# Patient Record
Sex: Female | Born: 1990
Health system: Southern US, Community
[De-identification: ages and names within clinical notes are randomized; demographics above are authoritative.]

## PROBLEM LIST (undated history)

## (undated) ENCOUNTER — Inpatient Hospital Stay (HOSPITAL_COMMUNITY): Payer: Self-pay

## (undated) DIAGNOSIS — G2579 Other drug induced movement disorders: Secondary | ICD-10-CM

## (undated) DIAGNOSIS — R87629 Unspecified abnormal cytological findings in specimens from vagina: Secondary | ICD-10-CM

## (undated) DIAGNOSIS — F419 Anxiety disorder, unspecified: Secondary | ICD-10-CM

## (undated) DIAGNOSIS — F909 Attention-deficit hyperactivity disorder, unspecified type: Secondary | ICD-10-CM

## (undated) DIAGNOSIS — J45909 Unspecified asthma, uncomplicated: Secondary | ICD-10-CM

## (undated) DIAGNOSIS — F410 Panic disorder [episodic paroxysmal anxiety] without agoraphobia: Secondary | ICD-10-CM

## (undated) DIAGNOSIS — G9081 Serotonin syndrome: Secondary | ICD-10-CM

## (undated) HISTORY — DX: Unspecified asthma, uncomplicated: J45.909

## (undated) HISTORY — PX: OTHER SURGICAL HISTORY: SHX169

## (undated) HISTORY — PX: INDUCED ABORTION: SHX677

## (undated) HISTORY — DX: Serotonin syndrome: G90.81

## (undated) HISTORY — DX: Other drug induced movement disorders: G25.79

---
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
## 2009-08-31 ENCOUNTER — Ambulatory Visit: Payer: Self-pay | Admitting: Internal Medicine

## 2011-11-09 ENCOUNTER — Emergency Department (HOSPITAL_COMMUNITY): Payer: No Typology Code available for payment source

## 2011-11-09 ENCOUNTER — Emergency Department (HOSPITAL_COMMUNITY)
Admission: EM | Admit: 2011-11-09 | Discharge: 2011-11-09 | Disposition: A | Discharge status: Home / Self Care | Payer: No Typology Code available for payment source | Attending: Emergency Medicine | Admitting: Emergency Medicine

## 2011-11-09 ENCOUNTER — Encounter (HOSPITAL_COMMUNITY): Payer: Self-pay | Admitting: Adult Health

## 2011-11-09 DIAGNOSIS — M542 Cervicalgia: Secondary | ICD-10-CM | POA: Insufficient documentation

## 2011-11-09 DIAGNOSIS — R51 Headache: Secondary | ICD-10-CM | POA: Insufficient documentation

## 2011-11-09 DIAGNOSIS — M25539 Pain in unspecified wrist: Secondary | ICD-10-CM | POA: Insufficient documentation

## 2011-11-09 DIAGNOSIS — T1490XA Injury, unspecified, initial encounter: Secondary | ICD-10-CM | POA: Insufficient documentation

## 2011-11-09 HISTORY — DX: Attention-deficit hyperactivity disorder, unspecified type: F90.9

## 2011-11-09 HISTORY — DX: Panic disorder (episodic paroxysmal anxiety): F41.0

## 2011-11-09 HISTORY — DX: Anxiety disorder, unspecified: F41.9
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
## 2011-11-09 MED ORDER — ACETAMINOPHEN 325 MG PO TABS
650.0000 mg | ORAL_TABLET | Freq: Once | ORAL | Status: AC
  Administered 2011-11-09: 650 mg via ORAL
  Filled 2011-11-09: qty 2

## 2011-11-09 MED ORDER — CYCLOBENZAPRINE HCL 5 MG PO TABS: 5.0000 mg | ORAL_TABLET | Freq: Three times a day (TID) | ORAL | Status: AC | PRN

## 2011-11-09 MED ORDER — IBUPROFEN 600 MG PO TABS: 600.0000 mg | ORAL_TABLET | Freq: Four times a day (QID) | ORAL | Status: AC | PRN
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
## 2011-11-09 NOTE — ED Notes (Signed)
Patient transported to CT 

## 2011-11-09 NOTE — ED Provider Notes (Signed)
Medical screening examination/treatment/procedure(s) were performed by non-physician practitioner and as supervising physician I was immediately available for consultation/collaboration.   Nat Christen, MD 11/09/11 [phone]
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
## 2011-11-09 NOTE — ED Notes (Signed)
PA at bedside.
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
## 2012-11-24 ENCOUNTER — Encounter: Payer: Self-pay | Admitting: Family Medicine

## 2013-09-15 NOTE — L&D Delivery Note (Signed)
SVD of VFI at 1620 on 09/07/14.  APGARs 9,9.  EBL 500cc.  Placenta to pathology. Head delivered LOA and body followed atraumatically.  Mouth and nose bulb suctioned.  Cord was clamped, cut and baby to abdomen.  Cord blood was obtained.  Placenta delivered S/I/3VC.  Fundus was firmed with pitocin and massage.  Right vaginal laceration was repaired with 3-0 Rapide in the normal fashion.  Mom and baby stable.    Mitchel HonourMegan Ermal Haberer, DO

## 2014-02-27 LAB — OB RESULTS CONSOLE GC/CHLAMYDIA
Chlamydia: NEGATIVE
Gonorrhea: NEGATIVE

## 2014-04-17 LAB — OB RESULTS CONSOLE RPR: RPR: NONREACTIVE

## 2014-04-17 LAB — OB RESULTS CONSOLE RUBELLA ANTIBODY, IGM: Rubella: IMMUNE

## 2014-04-17 LAB — OB RESULTS CONSOLE HEPATITIS B SURFACE ANTIGEN: Hepatitis B Surface Ag: NEGATIVE

## 2014-05-09 LAB — OB RESULTS CONSOLE ANTIBODY SCREEN: Antibody Screen: NEGATIVE

## 2014-05-09 LAB — OB RESULTS CONSOLE ABO/RH: RH Type: POSITIVE

## 2014-06-22 LAB — OB RESULTS CONSOLE HIV ANTIBODY (ROUTINE TESTING): HIV: NONREACTIVE

## 2014-08-09 LAB — OB RESULTS CONSOLE GBS: GBS: NEGATIVE

## 2014-08-24 ENCOUNTER — Encounter (HOSPITAL_COMMUNITY): Payer: Self-pay | Admitting: *Deleted

## 2014-08-24 ENCOUNTER — Inpatient Hospital Stay (HOSPITAL_COMMUNITY)
Admission: AD | Admit: 2014-08-24 | Discharge: 2014-08-24 | Disposition: A | Discharge status: Home / Self Care | Payer: BC Managed Care – PPO | Source: Ambulatory Visit | Attending: Obstetrics and Gynecology | Admitting: Obstetrics and Gynecology

## 2014-08-24 DIAGNOSIS — Z3A37 37 weeks gestation of pregnancy: Secondary | ICD-10-CM | POA: Insufficient documentation

## 2014-08-24 DIAGNOSIS — R51 Headache: Secondary | ICD-10-CM | POA: Diagnosis present

## 2014-08-24 DIAGNOSIS — O26893 Other specified pregnancy related conditions, third trimester: Secondary | ICD-10-CM

## 2014-08-24 DIAGNOSIS — O9989 Other specified diseases and conditions complicating pregnancy, childbirth and the puerperium: Secondary | ICD-10-CM | POA: Diagnosis not present

## 2014-08-24 DIAGNOSIS — Z87891 Personal history of nicotine dependence: Secondary | ICD-10-CM | POA: Diagnosis not present

## 2014-08-24 DIAGNOSIS — R519 Headache, unspecified: Secondary | ICD-10-CM

## 2014-08-24 HISTORY — DX: Unspecified abnormal cytological findings in specimens from vagina: R87.629

## 2014-08-24 LAB — COMPREHENSIVE METABOLIC PANEL
ALT: 13 U/L (ref 0–35)
AST: 15 U/L (ref 0–37)
Albumin: 2.7 g/dL — ABNORMAL LOW (ref 3.5–5.2)
Alkaline Phosphatase: 147 U/L — ABNORMAL HIGH (ref 39–117)
Anion gap: 13 (ref 5–15)
BUN: 7 mg/dL (ref 6–23)
CO2: 19 mEq/L (ref 19–32)
Calcium: 9 mg/dL (ref 8.4–10.5)
Chloride: 104 mEq/L (ref 96–112)
Creatinine, Ser: 0.69 mg/dL (ref 0.50–1.10)
GFR calc Af Amer: >90 mL/min (ref >90)
GFR calc non Af Amer: >90 mL/min (ref >90)
Glucose, Bld: 89 mg/dL (ref 70–99)
Potassium: 3.9 mEq/L (ref 3.7–5.3)
Sodium: 136 mEq/L — ABNORMAL LOW (ref 137–147)
Total Bilirubin: 0.2 mg/dL — ABNORMAL LOW (ref 0.3–1.2)
Total Protein: 6.3 g/dL (ref 6.0–8.3)

## 2014-08-24 LAB — CBC
HCT: 37.2 % (ref 36.0–46.0)
Hemoglobin: 12.6 g/dL (ref 12.0–15.0)
MCH: 32.2 pg (ref 26.0–34.0)
MCHC: 33.9 g/dL (ref 30.0–36.0)
MCV: 95.1 fL (ref 78.0–100.0)
Platelets: 201 10*3/uL (ref 150–400)
RBC: 3.91 MIL/uL (ref 3.87–5.11)
RDW: 13.3 % (ref 11.5–15.5)
WBC: 12.7 10*3/uL — ABNORMAL HIGH (ref 4.0–10.5)

## 2014-08-24 LAB — URINALYSIS, ROUTINE W REFLEX MICROSCOPIC
Bilirubin Urine: NEGATIVE
Glucose, UA: NEGATIVE mg/dL
Hgb urine dipstick: NEGATIVE
Ketones, ur: NEGATIVE mg/dL
Nitrite: NEGATIVE
Protein, ur: NEGATIVE mg/dL
Specific Gravity, Urine: <1.005 — ABNORMAL LOW (ref 1.005–1.030)
Urobilinogen, UA: 0.2 mg/dL (ref 0.0–1.0)
pH: 6 (ref 5.0–8.0)

## 2014-08-24 LAB — URINE MICROSCOPIC-ADD ON

## 2014-08-24 LAB — PROTEIN / CREATININE RATIO, URINE
Creatinine, Urine: 39.79 mg/dL
Protein Creatinine Ratio: 0.1 (ref 0.00–0.15)
Total Protein, Urine: 4 mg/dL

## 2014-08-24 LAB — AMNISURE RUPTURE OF MEMBRANE (ROM) NOT AT ARMC: Amnisure ROM: NEGATIVE

## 2014-08-24 MED ORDER — CYCLOBENZAPRINE HCL 10 MG PO TABS
10.0000 mg | ORAL_TABLET | Freq: Once | ORAL | Status: AC
  Administered 2014-08-24: 10 mg via ORAL
  Filled 2014-08-24: qty 1

## 2014-08-24 NOTE — MAU Provider Note (Signed)
History     CSN: [id number]  Arrival date and time: 08/24/14 1613   First Provider Initiated Contact with Patient 08/24/14 1714      Chief Complaint  Patient presents with  . Headache  . Hypertension   HPI  [name] [name] is a 23 y.o. G1P0 at 17106w5d who presents today with a headache. She states that she took two extra strength tylenol around 1400 today, and it did not help the headache. She states that she has had increased anxiety for the past couple of days. She states that she took tylenol PM last night, and woke up feeling nauseous this morning. She denies any visual disturbances. She has had some sensitivity to light today. She denies any RUQ pain.   Past Medical History  Diagnosis Date  . Anxiety   . ADHD (attention deficit hyperactivity disorder)   . Panic attacks   . Generalized headaches   . Vaginal Pap smear, abnormal     Past Surgical History  Procedure Laterality Date  . Wisdom teeth removal      History reviewed. No pertinent family history.  History  Substance Use Topics  . Smoking status: Former Smoker    Types: Cigarettes    Quit date: 08/15/2013  . Smokeless tobacco: Never Used  . Alcohol Use: Yes     Comment: socially    Allergies:  Allergies  Allergen Reactions  . Azithromycin Hives  . Ceclor [Cefaclor] Hives    Prescriptions prior to admission  Medication Sig Dispense Refill Last Dose  . acetaminophen (TYLENOL) 500 MG tablet Take 1,000 mg by mouth every 6 (six) hours as needed for headache.   08/24/2014 at Unknown time  . diphenhydramine-acetaminophen (TYLENOL PM) 25-500 MG TABS Take 2 tablets by mouth at bedtime as needed (For headache and sleep.).   08/23/2014 at Unknown time  . Prenatal Vit-Fe Fumarate-FA (PRENATAL MULTIVITAMIN) TABS tablet Take 1 tablet by mouth daily at 12 noon.   08/24/2014 at Unknown time    ROS Physical Exam   Blood pressure 134/88, pulse 86, temperature 98.9 F (37.2 C), temperature source Oral, resp. rate  18.  Physical Exam  Nursing note and vitals reviewed. Constitutional: She is oriented to person, place, and time. She appears well-developed and well-nourished. No distress.  Cardiovascular: Normal rate and regular rhythm.   Respiratory: Effort normal and breath sounds normal. No respiratory distress.  GI: Soft. There is no tenderness. There is no rebound.  Neurological: She is alert and oriented to person, place, and time. She has normal reflexes.  No clonus   Skin: Skin is warm and dry.  Psychiatric: She has a normal mood and affect.   FHT 135, moderate with 15x15 accels, no decels Toco: no UCs  MAU Course  Procedures   Results for orders placed or performed during the hospital encounter of 08/24/14 (from the past 24 hour(s))  Amnisure rupture of membrane (rom)     Status: None   Collection Time: 08/24/14  5:24 PM  Result Value Ref Range   Amnisure ROM NEGATIVE   Urinalysis, Routine w reflex microscopic     Status: Abnormal   Collection Time: 08/24/14  5:25 PM  Result Value Ref Range   Color, Urine YELLOW YELLOW   APPearance CLEAR CLEAR   Specific Gravity, Urine <1.005 (L) 1.005 - 1.030   pH 6.0 5.0 - 8.0   Glucose, UA NEGATIVE NEGATIVE mg/dL   Hgb urine dipstick NEGATIVE NEGATIVE   Bilirubin Urine NEGATIVE NEGATIVE   Ketones,  ur NEGATIVE NEGATIVE mg/dL   Protein, ur NEGATIVE NEGATIVE mg/dL   Urobilinogen, UA 0.2 0.0 - 1.0 mg/dL   Nitrite NEGATIVE NEGATIVE   Leukocytes, UA TRACE (A) NEGATIVE  Protein / creatinine ratio, urine     Status: None   Collection Time: 08/24/14  5:25 PM  Result Value Ref Range   Creatinine, Urine 39.79 mg/dL   Total Protein, Urine 4 mg/dL   Protein Creatinine Ratio 0.10 0.00 - 0.15  Urine microscopic-add on     Status: Abnormal   Collection Time: 08/24/14  5:25 PM  Result Value Ref Range   Squamous Epithelial / LPF FEW (A) RARE   WBC, UA 3-6 <3 WBC/hpf   RBC / HPF 3-6 <3 RBC/hpf   Bacteria, UA MANY (A) RARE   Urine-Other MUCOUS PRESENT    CBC     Status: Abnormal   Collection Time: 08/24/14  5:32 PM  Result Value Ref Range   WBC 12.7 (H) 4.0 - 10.5 K/uL   RBC 3.91 3.87 - 5.11 MIL/uL   Hemoglobin 12.6 12.0 - 15.0 g/dL   HCT 16.137.2 09.636.0 - 04.546.0 %   MCV 95.1 78.0 - 100.0 fL   MCH 32.2 26.0 - 34.0 pg   MCHC 33.9 30.0 - 36.0 g/dL   RDW 40.913.3 81.111.5 - 91.415.5 %   Platelets 201 150 - 400 K/uL  Comprehensive metabolic panel     Status: Abnormal   Collection Time: 08/24/14  5:32 PM  Result Value Ref Range   Sodium 136 (L) 137 - 147 mEq/L   Potassium 3.9 3.7 - 5.3 mEq/L   Chloride 104 96 - 112 mEq/L   CO2 19 19 - 32 mEq/L   Glucose, Bld 89 70 - 99 mg/dL   BUN 7 6 - 23 mg/dL   Creatinine, Ser 7.820.69 0.50 - 1.10 mg/dL   Calcium 9.0 8.4 - 95.610.5 mg/dL   Total Protein 6.3 6.0 - 8.3 g/dL   Albumin 2.7 (L) 3.5 - 5.2 g/dL   AST 15 0 - 37 U/L   ALT 13 0 - 35 U/L   Alkaline Phosphatase 147 (H) 39 - 117 U/L   Total Bilirubin 0.2 (L) 0.3 - 1.2 mg/dL   GFR calc non Af Amer >90 >90 mL/min   GFR calc Af Amer >90 >90 mL/min   Anion gap 13 5 - 15    1833: Patient reports that her headache has improved with the flexeril.  1835:DW Dr. Rana SnareLowe, ok for dc home. FU with the office as planned   Assessment and Plan   1. Headache in pregnancy, antepartum, third trimester    Pre-eclampsia danger signs reviewed Fetal kick counts Labor precautions  Return to MAU as needed  Follow-up Information    Follow up with LOWE,DAVID C, MD.   Specialty:  Obstetrics and Gynecology   Why:  As scheduled or , If symptoms worsen   Contact information:   [address], [address] [place] [id number] [phone]        [name] [name], [name] [name] 08/24/2014, 5:16 PM

## 2014-08-24 NOTE — MAU Note (Signed)
Been having horrible headache all day.  Started having blurred vision, googled it and started getting worried.  Took BP.  Called MD - instructed to come in .

## 2014-08-24 NOTE — Discharge Instructions (Signed)

## 2014-09-05 ENCOUNTER — Encounter (HOSPITAL_COMMUNITY): Payer: Self-pay | Admitting: *Deleted

## 2014-09-05 ENCOUNTER — Telehealth (HOSPITAL_COMMUNITY): Payer: Self-pay | Admitting: *Deleted

## 2014-09-05 NOTE — Telephone Encounter (Signed)
Preadmission screen  

## 2014-09-06 ENCOUNTER — Inpatient Hospital Stay (HOSPITAL_COMMUNITY)
Admission: RE | Admit: 2014-09-06 | Discharge: 2014-09-08 | DRG: 775 | Disposition: A | Discharge status: Home / Self Care | Payer: BC Managed Care – PPO | Source: Ambulatory Visit | Attending: Obstetrics & Gynecology | Admitting: Obstetrics & Gynecology

## 2014-09-06 ENCOUNTER — Encounter (HOSPITAL_COMMUNITY): Payer: Self-pay

## 2014-09-06 DIAGNOSIS — O139 Gestational [pregnancy-induced] hypertension without significant proteinuria, unspecified trimester: Secondary | ICD-10-CM | POA: Diagnosis present

## 2014-09-06 DIAGNOSIS — Z3A39 39 weeks gestation of pregnancy: Secondary | ICD-10-CM | POA: Diagnosis present

## 2014-09-06 DIAGNOSIS — O99314 Alcohol use complicating childbirth: Secondary | ICD-10-CM | POA: Diagnosis present

## 2014-09-06 DIAGNOSIS — O133 Gestational [pregnancy-induced] hypertension without significant proteinuria, third trimester: Secondary | ICD-10-CM | POA: Diagnosis present

## 2014-09-06 DIAGNOSIS — Z87891 Personal history of nicotine dependence: Secondary | ICD-10-CM

## 2014-09-06 LAB — TYPE AND SCREEN
ABO/RH(D): O POS
Antibody Screen: NEGATIVE

## 2014-09-06 LAB — CBC
HCT: 35.5 % — ABNORMAL LOW (ref 36.0–46.0)
Hemoglobin: 12.5 g/dL (ref 12.0–15.0)
MCH: 33 pg (ref 26.0–34.0)
MCHC: 35.2 g/dL (ref 30.0–36.0)
MCV: 93.7 fL (ref 78.0–100.0)
Platelets: 180 10*3/uL (ref 150–400)
RBC: 3.79 MIL/uL — ABNORMAL LOW (ref 3.87–5.11)
RDW: 13.4 % (ref 11.5–15.5)
WBC: 13.3 10*3/uL — ABNORMAL HIGH (ref 4.0–10.5)

## 2014-09-06 MED ORDER — BUTORPHANOL TARTRATE 1 MG/ML IJ SOLN
1.0000 mg | INTRAMUSCULAR | Status: DC | PRN
  Administered 2014-09-07: 1 mg via INTRAVENOUS
  Filled 2014-09-06: qty 1

## 2014-09-06 MED ORDER — FLEET ENEMA 7-19 GM/118ML RE ENEM: 1.0000 | ENEMA | Freq: Once | RECTAL | Status: DC

## 2014-09-06 MED ORDER — ZOLPIDEM TARTRATE 5 MG PO TABS
5.0000 mg | ORAL_TABLET | Freq: Every evening | ORAL | Status: DC | PRN
  Administered 2014-09-06: 5 mg via ORAL
  Filled 2014-09-06: qty 1

## 2014-09-06 MED ORDER — OXYCODONE-ACETAMINOPHEN 5-325 MG PO TABS: 2.0000 | ORAL_TABLET | ORAL | Status: DC | PRN

## 2014-09-06 MED ORDER — LACTATED RINGERS IV SOLN
500.0000 mL | INTRAVENOUS | Status: DC | PRN
Start: 2014-09-06 — End: 2014-09-07

## 2014-09-06 MED ORDER — ONDANSETRON HCL 4 MG/2ML IJ SOLN: 4.0000 mg | Freq: Four times a day (QID) | INTRAMUSCULAR | Status: DC | PRN

## 2014-09-06 MED ORDER — MISOPROSTOL 25 MCG QUARTER TABLET
25.0000 ug | ORAL_TABLET | ORAL | Status: AC | PRN
  Administered 2014-09-06 – 2014-09-07 (×2): 25 ug via VAGINAL
  Filled 2014-09-06 (×2): qty 0.25

## 2014-09-06 MED ORDER — LIDOCAINE HCL (PF) 1 % IJ SOLN
30.0000 mL | INTRAMUSCULAR | Status: AC | PRN
  Administered 2014-09-07 (×2): 4 mL via SUBCUTANEOUS

## 2014-09-06 MED ORDER — OXYTOCIN BOLUS FROM INFUSION: 500.0000 mL | INTRAVENOUS | Status: DC

## 2014-09-06 MED ORDER — BUTORPHANOL TARTRATE 1 MG/ML IJ SOLN: 1.0000 mg | INTRAMUSCULAR | Status: DC

## 2014-09-06 MED ORDER — LACTATED RINGERS IV SOLN
INTRAVENOUS | Status: DC
  Administered 2014-09-06 – 2014-09-07 (×2): via INTRAVENOUS

## 2014-09-06 MED ORDER — OXYCODONE-ACETAMINOPHEN 5-325 MG PO TABS: 1.0000 | ORAL_TABLET | ORAL | Status: DC | PRN

## 2014-09-06 MED ORDER — CITRIC ACID-SODIUM CITRATE 334-500 MG/5ML PO SOLN
30.0000 mL | ORAL | Status: DC | PRN
  Filled 2014-09-06: qty 15

## 2014-09-06 MED ORDER — ACETAMINOPHEN 325 MG PO TABS: 650.0000 mg | ORAL_TABLET | ORAL | Status: DC | PRN

## 2014-09-06 MED ORDER — TERBUTALINE SULFATE 1 MG/ML IJ SOLN: 0.2500 mg | Freq: Once | INTRAMUSCULAR | Status: AC | PRN

## 2014-09-06 MED ORDER — OXYTOCIN 40 UNITS IN LACTATED RINGERS INFUSION - SIMPLE MED
62.5000 mL/h | INTRAVENOUS | Status: DC
  Filled 2014-09-06: qty 1000

## 2014-09-06 MED ORDER — OXYTOCIN 40 UNITS IN LACTATED RINGERS INFUSION - SIMPLE MED
1.0000 m[IU]/min | INTRAVENOUS | Status: DC
  Administered 2014-09-07: 2 m[IU]/min via INTRAVENOUS
  Administered 2014-09-07: 666 m[IU]/min via INTRAVENOUS

## 2014-09-06 NOTE — Plan of Care (Signed)
Problem: Consults Goal: Birthing Suites Patient Information Press F2 to bring up selections list Outcome: Completed/Met Date Met:  09/06/14  Pt 37-[redacted] weeks EGA and Inpatient induction

## 2014-09-07 ENCOUNTER — Encounter (HOSPITAL_COMMUNITY): Payer: Self-pay

## 2014-09-07 ENCOUNTER — Inpatient Hospital Stay (HOSPITAL_COMMUNITY): Payer: BC Managed Care – PPO | Admitting: Anesthesiology

## 2014-09-07 DIAGNOSIS — O139 Gestational [pregnancy-induced] hypertension without significant proteinuria, unspecified trimester: Secondary | ICD-10-CM | POA: Diagnosis present

## 2014-09-07 LAB — CBC
HCT: 36.1 % (ref 36.0–46.0)
Hemoglobin: 12.4 g/dL (ref 12.0–15.0)
MCH: 32.3 pg (ref 26.0–34.0)
MCHC: 34.3 g/dL (ref 30.0–36.0)
MCV: 94 fL (ref 78.0–100.0)
Platelets: 173 10*3/uL (ref 150–400)
RBC: 3.84 MIL/uL — ABNORMAL LOW (ref 3.87–5.11)
RDW: 13.3 % (ref 11.5–15.5)
WBC: 12.4 10*3/uL — ABNORMAL HIGH (ref 4.0–10.5)

## 2014-09-07 LAB — RPR

## 2014-09-07 LAB — ABO/RH: ABO/RH(D): O POS

## 2014-09-07 MED ORDER — EPHEDRINE 5 MG/ML INJ
10.0000 mg | INTRAVENOUS | Status: DC | PRN
  Filled 2014-09-07: qty 2

## 2014-09-07 MED ORDER — OXYCODONE-ACETAMINOPHEN 5-325 MG PO TABS: 1.0000 | ORAL_TABLET | ORAL | Status: DC | PRN

## 2014-09-07 MED ORDER — PRENATAL MULTIVITAMIN CH
1.0000 | ORAL_TABLET | Freq: Every day | ORAL | Status: DC
  Administered 2014-09-08: 1 via ORAL
  Filled 2014-09-07: qty 1

## 2014-09-07 MED ORDER — PHENYLEPHRINE 40 MCG/ML (10ML) SYRINGE FOR IV PUSH (FOR BLOOD PRESSURE SUPPORT)
80.0000 ug | PREFILLED_SYRINGE | INTRAVENOUS | Status: DC | PRN
Start: 2014-09-07 — End: 2014-09-07

## 2014-09-07 MED ORDER — ZOLPIDEM TARTRATE 5 MG PO TABS: 5.0000 mg | ORAL_TABLET | Freq: Every evening | ORAL | Status: DC | PRN

## 2014-09-07 MED ORDER — TETANUS-DIPHTH-ACELL PERTUSSIS 5-2.5-18.5 LF-MCG/0.5 IM SUSP: 0.5000 mL | Freq: Once | INTRAMUSCULAR | Status: DC

## 2014-09-07 MED ORDER — DIPHENHYDRAMINE HCL 50 MG/ML IJ SOLN: 12.5000 mg | INTRAMUSCULAR | Status: DC | PRN

## 2014-09-07 MED ORDER — LACTATED RINGERS IV SOLN: 500.0000 mL | Freq: Once | INTRAVENOUS | Status: DC

## 2014-09-07 MED ORDER — EPHEDRINE 5 MG/ML INJ
10.0000 mg | INTRAVENOUS | Status: DC | PRN
Start: 2014-09-07 — End: 2014-09-07

## 2014-09-07 MED ORDER — LANOLIN HYDROUS EX OINT: TOPICAL_OINTMENT | CUTANEOUS | Status: DC | PRN

## 2014-09-07 MED ORDER — FENTANYL 2.5 MCG/ML BUPIVACAINE 1/10 % EPIDURAL INFUSION (WH - ANES)
14.0000 mL/h | INTRAMUSCULAR | Status: DC | PRN
Start: 2014-09-07 — End: 2014-09-08
  Administered 2014-09-07 (×2): 14 mL/h via EPIDURAL
  Filled 2014-09-07 (×2): qty 125

## 2014-09-07 MED ORDER — EPHEDRINE 5 MG/ML INJ: 10.0000 mg | INTRAVENOUS | Status: DC | PRN

## 2014-09-07 MED ORDER — ONDANSETRON HCL 4 MG/2ML IJ SOLN: 4.0000 mg | INTRAMUSCULAR | Status: DC | PRN

## 2014-09-07 MED ORDER — IBUPROFEN 600 MG PO TABS
600.0000 mg | ORAL_TABLET | Freq: Four times a day (QID) | ORAL | Status: DC
  Administered 2014-09-07 – 2014-09-08 (×4): 600 mg via ORAL
  Filled 2014-09-07 (×4): qty 1

## 2014-09-07 MED ORDER — BENZOCAINE-MENTHOL 20-0.5 % EX AERO
1.0000 "application " | INHALATION_SPRAY | CUTANEOUS | Status: DC | PRN
  Administered 2014-09-07: 1 via TOPICAL
  Filled 2014-09-07: qty 56

## 2014-09-07 MED ORDER — DIBUCAINE 1 % RE OINT: 1.0000 "application " | TOPICAL_OINTMENT | RECTAL | Status: DC | PRN

## 2014-09-07 MED ORDER — WITCH HAZEL-GLYCERIN EX PADS
1.0000 "application " | MEDICATED_PAD | CUTANEOUS | Status: DC | PRN
  Administered 2014-09-07: 1 via TOPICAL

## 2014-09-07 MED ORDER — LIDOCAINE HCL (PF) 1 % IJ SOLN: 30.0000 mL | Freq: Once | INTRAMUSCULAR | Status: DC

## 2014-09-07 MED ORDER — OXYCODONE-ACETAMINOPHEN 5-325 MG PO TABS
2.0000 | ORAL_TABLET | ORAL | Status: DC | PRN
  Administered 2014-09-07 – 2014-09-08 (×5): 2 via ORAL
  Filled 2014-09-07 (×5): qty 2

## 2014-09-07 MED ORDER — FENTANYL 2.5 MCG/ML BUPIVACAINE 1/10 % EPIDURAL INFUSION (WH - ANES): 14.0000 mL/h | INTRAMUSCULAR | Status: DC | PRN

## 2014-09-07 MED ORDER — DIPHENHYDRAMINE HCL 25 MG PO CAPS: 25.0000 mg | ORAL_CAPSULE | Freq: Four times a day (QID) | ORAL | Status: DC | PRN

## 2014-09-07 MED ORDER — SIMETHICONE 80 MG PO CHEW: 80.0000 mg | CHEWABLE_TABLET | ORAL | Status: DC | PRN

## 2014-09-07 MED ORDER — PHENYLEPHRINE 40 MCG/ML (10ML) SYRINGE FOR IV PUSH (FOR BLOOD PRESSURE SUPPORT)
80.0000 ug | PREFILLED_SYRINGE | INTRAVENOUS | Status: DC | PRN
  Filled 2014-09-07: qty 2

## 2014-09-07 MED ORDER — ONDANSETRON HCL 4 MG PO TABS: 4.0000 mg | ORAL_TABLET | ORAL | Status: DC | PRN

## 2014-09-07 MED ORDER — PHENYLEPHRINE 40 MCG/ML (10ML) SYRINGE FOR IV PUSH (FOR BLOOD PRESSURE SUPPORT)
80.0000 ug | PREFILLED_SYRINGE | INTRAVENOUS | Status: DC | PRN
  Filled 2014-09-07: qty 2
  Filled 2014-09-07: qty 10

## 2014-09-07 MED ORDER — SENNOSIDES-DOCUSATE SODIUM 8.6-50 MG PO TABS
2.0000 | ORAL_TABLET | ORAL | Status: DC
  Administered 2014-09-08 (×2): 1 via ORAL
  Filled 2014-09-07: qty 1
  Filled 2014-09-07: qty 2

## 2014-09-07 NOTE — Anesthesia Preprocedure Evaluation (Signed)
Anesthesia Evaluation  Patient identified by MRN, date of birth, ID band Patient awake    Reviewed: Allergy & Precautions, H&P , NPO status , Patient's Chart, lab work & pertinent test results  History of Anesthesia Complications Negative for: history of anesthetic complications  Airway Mallampati: II       Dental  (+) Teeth Intact, Dental Advidsory Given   Pulmonary former smoker,  breath sounds clear to auscultation        Cardiovascular Exercise Tolerance: Good negative cardio ROS  Rhythm:regular Rate:Normal     Neuro/Psych    GI/Hepatic negative GI ROS, Neg liver ROS,   Endo/Other  obesity  Renal/GU negative Renal ROS     Musculoskeletal   Abdominal   Peds  Hematology negative hematology ROS (+)   Anesthesia Other Findings   Reproductive/Obstetrics (+) Pregnancy                             Anesthesia Physical Anesthesia Plan  ASA: II  Anesthesia Plan: Epidural   Post-op Pain Management:    Induction:   Airway Management Planned:   Additional Equipment:   Intra-op Plan:   Post-operative Plan:   Informed Consent: I have reviewed the patients History and Physical, chart, labs and discussed the procedure including the risks, benefits and alternatives for the proposed anesthesia with the patient or authorized representative who has indicated his/her understanding and acceptance.   Dental Advisory Given  Plan Discussed with: Anesthesiologist  Anesthesia Plan Comments:         Anesthesia Quick Evaluation

## 2014-09-07 NOTE — Anesthesia Procedure Notes (Signed)
Epidural Patient location during procedure: OB Start time: 09/07/2014 7:50 AM End time: 09/07/2014 8:00 AM  Staffing Anesthesiologist: Karie SchwalbeJUDD, Eileene Kisling JENNETTE Performed by: anesthesiologist   Preanesthetic Checklist Completed: patient identified, site marked, surgical consent, pre-op evaluation, timeout performed, IV checked, risks and benefits discussed and monitors and equipment checked  Epidural Patient position: sitting Prep: site prepped and draped and DuraPrep Patient monitoring: continuous pulse ox and blood pressure Approach: midline Location: L3-L4 Injection technique: LOR saline  Needle:  Needle type: Tuohy  Needle gauge: 17 G Needle length: 9 cm and 9 Needle insertion depth: 7 cm Catheter type: closed end flexible Catheter size: 19 Gauge Catheter at skin depth: 11 cm Test dose: negative  Assessment Sensory level: T10 Events: blood not aspirated, injection not painful, no injection resistance, negative IV test and no paresthesia  Additional Notes Reason for block:procedure for pain

## 2014-09-07 NOTE — H&P (Signed)
[name] [name] is a 23 y.o. female presenting for induction of labor for gestational hypertension.  She has no HA, CP/SOB, RUQ pain, or vision change.  She received VMP overnight and is currently on pitocin; comfortable with epidural.  Antepartum course has been complicated by anxiety on no medication.  The fetus also has a single umbilical artery with otherwise normal anatomy scan and normal growth.  GBS negative.  Maternal Medical History:  Reason for admission: Nausea.  Fetal activity: Perceived fetal activity is normal.   Last perceived fetal movement was within the past hour.    Prenatal complications: no prenatal complications Prenatal Complications - Diabetes: none.    OB History    Gravida Para Term Preterm AB TAB SAB Ectopic Multiple Living   2 0   1 1         Past Medical History  Diagnosis Date  . Anxiety   . ADHD (attention deficit hyperactivity disorder)   . Panic attacks   . Generalized headaches   . Vaginal Pap smear, abnormal    Past Surgical History  Procedure Laterality Date  . Wisdom teeth removal    . Induced abortion     Family History: family history includes Cancer in her father; Heart disease in her father and paternal grandfather; Hypertension in her mother. Social History:  reports that she quit smoking about 12 months ago. Her smoking use included Cigarettes. She smoked 0.00 packs per day. She has never used smokeless tobacco. She reports that she drinks alcohol. She reports that she does not use illicit drugs.   Prenatal Transfer Tool  Maternal Diabetes: No Genetic Screening: Declined Maternal Ultrasounds/Referrals: Abnormal:  Findings:   Other:single umbilical artery Fetal Ultrasounds or other Referrals:  None Maternal Substance Abuse:  No Significant Maternal Medications:  None Significant Maternal Lab Results:  Lab values include: Group B Strep negative Other Comments:  None  Review of Systems  Eyes: Negative for blurred vision.   Cardiovascular: Negative for chest pain.  Gastrointestinal: Negative for nausea and vomiting.  Neurological: Negative for headaches.    Dilation: 1.5 Effacement (%): 80 Station: 0 Exam by:: [name] NickelAleta Harper, [name] [name] Blood pressure 132/97, pulse 86, temperature 98.5 F (36.9 C), temperature source Oral, resp. rate 18, height 5\' 7"  (1.702 m), weight 212 lb (96.163 kg), SpO2 99 %. Maternal Exam:  Uterine Assessment: Contraction strength is moderate.  Contraction frequency is regular.   Abdomen: Patient reports no abdominal tenderness. Fundal height is c/w dates.   Estimated fetal weight is 7#8.       Physical Exam  Constitutional: She is oriented to person, place, and time. She appears well-developed and well-nourished.  GI: Soft. There is no rebound and no guarding.  Neurological: She is alert and oriented to person, place, and time.  Skin: Skin is warm and dry.  Psychiatric: She has a normal mood and affect. Her behavior is normal.    Prenatal labs: ABO, Rh: --/--/O POS, O POS (12/23 2100) Antibody: NEG (12/23 2100) Rubella: Immune (08/03 0000) RPR: NON REAC (12/23 2100)  HBsAg: Negative (08/03 0000)  HIV: Non-reactive (10/08 0000)  GBS: Negative (11/25 0000)   Assessment/Plan: 23yo G2P0010 at 5233w5d with GHTN for IOL -Continue pitocin -Will AROM when able -Anticipate NSVD   [name] [name] 09/07/2014, 9:01 AM

## 2014-09-08 LAB — CBC
HCT: 31.3 % — ABNORMAL LOW (ref 36.0–46.0)
Hemoglobin: 10.7 g/dL — ABNORMAL LOW (ref 12.0–15.0)
MCH: 32.3 pg (ref 26.0–34.0)
MCHC: 34.2 g/dL (ref 30.0–36.0)
MCV: 94.6 fL (ref 78.0–100.0)
Platelets: 156 10*3/uL (ref 150–400)
RBC: 3.31 MIL/uL — ABNORMAL LOW (ref 3.87–5.11)
RDW: 13.4 % (ref 11.5–15.5)
WBC: 14.2 10*3/uL — ABNORMAL HIGH (ref 4.0–10.5)

## 2014-09-08 MED ORDER — OXYCODONE-ACETAMINOPHEN 5-325 MG PO TABS: 1.0000 | ORAL_TABLET | ORAL | Status: DC | PRN

## 2014-09-08 MED ORDER — IBUPROFEN 600 MG PO TABS: 600.0000 mg | ORAL_TABLET | Freq: Four times a day (QID) | ORAL | Status: DC

## 2014-09-08 NOTE — Lactation Note (Signed)
This note was copied from the chart of Girl Verdia Tuminello. Lactation Consultation Note  Patient Name: Girl Rosemarie BeathSophia Grieco VZDGL'OToday's Date: 09/08/2014 Reason for consult: Initial assessment  Baby 22 hours of life. Mom reports sore nipples and difficulty latching baby. Mom not able to hand express colostrum readily. After about 5 minutes of massage and hand expression, able to get a drop of colostrum from right breast. Tip of baby's tongue is heart-shaped, and baby has trouble suckling LC's gloved finger. Baby tongue-thrusts, and mom reports this is what it feels like the baby does at the breast. Enc parents to discuss baby's tight frenulum with pediatrician, especially if difficulty latching and nipple pain continue after milk comes in. [facility] with [facility]atching baby directly to breast without NS. Offered to assist mom to supplement baby at breast using curve-tipped syringe and NS, but mom preferred FOB give formula with bottle. Demonstrated to FOB how to supplement with bottle according to supplementation guidelines.  Plan if for mom to put baby to breast with cues, then supplement with EBM/formula according to guidelines, then post-pump for 15 minutes. Mom states that she has a DEBP at home. Enc mom to hand express after pumping. Mom aware of OP/BFSG and LC phone line assistance after D/C. Referred parents to Baby and Me booklet for number of diapers to expect and EBM guidelines. Enc parents to call for assistance as needed. Parents state that baby has a follow-up appointment with pediatrician on Sunday, December 27th. Maternal Data Has patient been taught Hand Expression?: Yes Does the patient have breastfeeding experience prior to this delivery?: No  Feeding Feeding Type: Breast Fed Length of feed: 0 min  LATCH Score/Interventions Latch: Too sleepy or reluctant, no latch achieved, no sucking elicited.                    Lactation Tools  Discussed/Used     Consult Status Consult Status: PRN    Geralynn OchsWILLIARD, Callin Ashe 09/08/2014, 3:13 PM

## 2014-09-08 NOTE — Discharge Summary (Signed)
Obstetric Discharge Summary Reason for Admission: onset of labor Prenatal Procedures: Intrapartum Procedures: spontaneous vaginal delivery Postpartum Procedures: none Complications-Operative and Postpartum: 1st degree perineal laceration HEMOGLOBIN  Date Value Ref Range Status  09/08/2014 10.7* 12.0 - 15.0 g/dL Final   HCT  Date Value Ref Range Status  09/08/2014 31.3* 36.0 - 46.0 % Final    Physical Exam:  General: alert, cooperative, appears stated age and mild distress Lochia: appropriate Uterine Fundus: firm Incision: healing well DVT Evaluation: No evidence of DVT seen on physical exam.  Discharge Diagnoses: Term Pregnancy-delivered  Discharge Information: Date: 09/08/2014 Activity: pelvic rest Diet: routine Medications: Ibuprofen and Percocet Condition: stable Instructions: refer to practice specific booklet Discharge to: home   Newborn Data: Live born female  Birth Weight: 7 lb 13.8 oz (3565 g) APGAR: 9, 9  Home with mother.  [name] [name] 09/08/2014, 8:12 AM

## 2014-09-08 NOTE — Anesthesia Postprocedure Evaluation (Signed)
  Anesthesia Post-op Note  Anesthesia Post Note  Patient: [name] CoyerSophia M [name]  Procedure(s) Performed: * No procedures listed *  Anesthesia type: Epidural  Patient location: Mother/Baby  Post pain: Pain level controlled  Post assessment: Post-op Vital signs reviewed  Last Vitals:  Filed Vitals:   09/08/14 0618  BP: 126/69  Pulse: 87  Temp: 36.6 C  Resp:     Post vital signs: Reviewed  Level of consciousness:alert  Complications: No apparent anesthesia complications

## 2014-09-10 ENCOUNTER — Inpatient Hospital Stay (HOSPITAL_COMMUNITY): Admission: RE | Admit: 2014-09-10 | Payer: No Typology Code available for payment source | Source: Ambulatory Visit

## 2014-10-23 ENCOUNTER — Telehealth: Payer: Self-pay | Admitting: *Deleted

## 2014-10-23 ENCOUNTER — Ambulatory Visit (INDEPENDENT_AMBULATORY_CARE_PROVIDER_SITE_OTHER): Payer: BLUE CROSS/BLUE SHIELD | Admitting: Family Medicine

## 2014-10-23 ENCOUNTER — Encounter: Payer: Self-pay | Admitting: Family Medicine

## 2014-10-23 VITALS — BP 110/80 | HR 90 | Temp 98.4°F | Resp 16 | Ht 67.0 in | Wt 187.0 lb

## 2014-10-23 DIAGNOSIS — L03019 Cellulitis of unspecified finger: Secondary | ICD-10-CM

## 2014-10-23 DIAGNOSIS — F988 Other specified behavioral and emotional disorders with onset usually occurring in childhood and adolescence: Secondary | ICD-10-CM

## 2014-10-23 DIAGNOSIS — IMO0002 Reserved for concepts with insufficient information to code with codable children: Secondary | ICD-10-CM

## 2014-10-23 DIAGNOSIS — F909 Attention-deficit hyperactivity disorder, unspecified type: Secondary | ICD-10-CM

## 2014-10-23 DIAGNOSIS — F419 Anxiety disorder, unspecified: Secondary | ICD-10-CM

## 2014-10-23 MED ORDER — AMPHETAMINE-DEXTROAMPHETAMINE 20 MG PO TABS: ORAL_TABLET | ORAL | Status: DC

## 2014-10-23 MED ORDER — SULFAMETHOXAZOLE-TRIMETHOPRIM 800-160 MG PO TABS: 1.0000 | ORAL_TABLET | Freq: Two times a day (BID) | ORAL | Status: DC

## 2014-10-23 MED ORDER — CLONAZEPAM 1 MG PO TABS: ORAL_TABLET | ORAL | Status: DC

## 2014-10-23 NOTE — Progress Notes (Signed)
[facility] [address] [place] [address] [phone]  Date:  10/23/2014   Name:  [name] [name]   DOB:  Oct 03, 1990   MRN:  [id number]  PCP:  No primary care provider on file.    Chief Complaint: Establish Care   History of Present Illness:  [name] [name] is a 24 y.o. very pleasant female patient who presents with the following:  She is here today as a new patient. She moved back to [place] from [place] during her recent pregnancy.  She delivered a healhty baby girl on December. She has family in the area so she and her husband came back   She would like for [place] to take over writing for her medications as below.   She is not breastfeeding due to her medications.   She is on adderall 20 BID.  She was dx with ADD in the 4th grade.  She went to college at [employer], was treated for add there and most recently she was being treated in [place] by her PCP.  She is also on klonpin for anxiety prn. She takes 1 mg- 1/2 or 1 once a day generally.  She is on OCP but has not yet had a cycle since her delivery- this is her first pack of pills  She is otherwise generally in good health  Also about 3 weeks ago she notes that around her nails seemed to be tender, red and hot, and she could actually express some pus. This has improved but not yet resolved- no more pus but still tender   Patient Active Problem List   Diagnosis Date Noted  . Gestational hypertension 09/07/2014  . Normal labor 09/06/2014    Past Medical History  Diagnosis Date  . Anxiety   . ADHD (attention deficit hyperactivity disorder)   . Panic attacks   . Generalized headaches   . Vaginal Pap smear, abnormal     Past Surgical History  Procedure Laterality Date  . Wisdom teeth removal Bilateral     2010  . Induced abortion      History  Substance Use Topics  . Smoking status: Former Smoker    Types: Cigarettes    Quit date: 08/15/2013  . Smokeless tobacco: Never Used  . Alcohol Use: 1.2 oz/week    2  Not specified per week     Comment: socially    Family History  Problem Relation Age of Onset  . Hypertension Mother   . Heart disease Father   . Cancer Father     testicular  . Hypertension Father   . Hyperlipidemia Father   . Heart disease Paternal Grandfather     Allergies  Allergen Reactions  . Azithromycin Hives  . Ceclor [Cefaclor] Hives    Medication list has been reviewed and updated.  Current Outpatient Prescriptions on File Prior to Visit  Medication Sig Dispense Refill  . acetaminophen (TYLENOL) 500 MG tablet Take 1,000 mg by mouth every 6 (six) hours as needed for headache.    . diphenhydramine-acetaminophen (TYLENOL PM) 25-500 MG TABS Take 2 tablets by mouth at bedtime as needed (For headache and sleep.).    Marland Kitchen ibuprofen (ADVIL,MOTRIN) 600 MG tablet Take 1 tablet (600 mg total) by mouth every 6 (six) hours. (Patient not taking: Reported on 10/23/2014) 30 tablet 0  . oxyCODONE-acetaminophen (PERCOCET/ROXICET) 5-325 MG per tablet Take 1 tablet by mouth every 4 (four) hours as needed (for pain scale less than 7). (Patient not taking:  Reported on 10/23/2014) 30 tablet 0  . Prenatal Vit-Fe Fumarate-FA (PRENATAL MULTIVITAMIN) TABS tablet Take 1 tablet by mouth daily at 12 noon.     No current facility-administered medications on file prior to visit.    Review of Systems:  As per HPI- otherwise negative.   Physical Examination: Filed Vitals:   10/23/14 1039  BP: 110/80  Pulse: 90  Temp: 98.4 F (36.9 C)  Resp: 16   Filed Vitals:   10/23/14 1039  Height: 5\' 7"  (1.702 m)  Weight: 187 lb (84.823 kg)   Body mass index is 29.28 kg/(m^2). Ideal Body Weight: Weight in (lb) to have BMI = 25: 159.3  GEN: WDWN, NAD, Non-toxic, A & O x 3, looks well HEENT: Atraumatic, Normocephalic. Neck supple. No masses, No LAD. Ears and Nose: No external deformity. CV: RRR, No M/G/R. No JVD. No thrill. No extra heart sounds. PULM: CTA B, no wheezes, crackles, rhonchi. No  retractions. No resp. distress. No accessory muscle use. EXTR: No c/c/e NEURO Normal gait.  PSYCH: Normally interactive. Conversant. Not depressed or anxious appearing.  Calm demeanor.  Evidence of resolving paronychia around several of her finger nails  Assessment and Plan: ADD (attention deficit disorder) - Plan: amphetamine-dextroamphetamine (ADDERALL) 20 MG tablet  Anxiety disorder, unspecified anxiety disorder type - Plan: clonazePAM (KLONOPIN) 1 MG tablet  Paronychia, unspecified laterality - Plan: sulfamethoxazole-trimethoprim (BACTRIM DS,SEPTRA DS) 800-160 MG per tablet  Went over controlled substance policy and did records release for her today so we can get her records from [place].   Refilled her medications- she is not nursing See patient instructions for more details.     Signed Abbe AmsterdamJessica Copland, MD

## 2014-10-23 NOTE — Telephone Encounter (Signed)
Faxed signed authorization for medical records to be release from Dr [facility]ricia Han[facility] [place].

## 2014-10-23 NOTE — Patient Instructions (Signed)
Good to meet you today!  I will look for your records- once we have these I can give you 2 more months of adderall I can give you 3 adderall rx at a time, and we need to visit face to face every 6 months.    Use the septra twice a day for one week for your nails- let me know if not better.  Remember that antibiotics can reduce the efficacy of your birth control pill.   [facility] Policy for Prescribing Controlled Substances (Revised 07/2012) 1. Prescriptions for controlled substances will be filled by ONE provider at [facility] with whom you have established and developed a plan for your care, including follow-up. 2. You are encouraged to schedule an appointment with your prescriber at our appointment center for follow-up visits whenever possible. 3. If you request a prescription for the controlled substance while at [facility] for an acute problem (with someone other than your regular prescriber), you MAY be given a ONE-TIME prescription for a 30-day supply of the controlled substance, to allow time for you to return to see your regular prescriber for additional prescriptions

## 2014-10-27 ENCOUNTER — Other Ambulatory Visit: Payer: Self-pay | Admitting: Family Medicine

## 2014-10-27 ENCOUNTER — Encounter: Payer: Self-pay | Admitting: Family Medicine

## 2014-10-27 DIAGNOSIS — F411 Generalized anxiety disorder: Secondary | ICD-10-CM | POA: Insufficient documentation

## 2014-10-27 DIAGNOSIS — F909 Attention-deficit hyperactivity disorder, unspecified type: Secondary | ICD-10-CM | POA: Insufficient documentation

## 2014-10-27 DIAGNOSIS — F988 Other specified behavioral and emotional disorders with onset usually occurring in childhood and adolescence: Secondary | ICD-10-CM

## 2014-10-27 MED ORDER — AMPHETAMINE-DEXTROAMPHETAMINE 20 MG PO TABS: ORAL_TABLET | ORAL | Status: DC

## 2014-10-27 MED ORDER — AMPHETAMINE-DEXTROAMPHETAMINE 20 MG PO TABS: 20.0000 mg | ORAL_TABLET | Freq: Two times a day (BID) | ORAL | Status: DC

## 2014-10-27 NOTE — Progress Notes (Signed)
Received records and issued adderall rx for march and April.  Called to let her know

## 2014-10-29 ENCOUNTER — Telehealth: Payer: Self-pay

## 2014-10-29 NOTE — Telephone Encounter (Signed)
Dr. Patsy Lageropland, [facility] called to let you know that this pt filled #30 Klonipin 1mg  qd prn on 10/13/14 by prescriber Richarda Overlieichard Holland. Is it still ok for this pt to p/u the rx you wrote on 2/8?

## 2014-10-30 NOTE — Telephone Encounter (Signed)
Please let pharmacy know it is ok to fill this on 11/09/2014.  Thank you!

## 2014-10-31 ENCOUNTER — Encounter: Payer: Self-pay | Admitting: Family Medicine

## 2014-10-31 NOTE — Telephone Encounter (Signed)
Denny Peonrin do you remember which [facility]? The pharmacy she has on her chart is [facility].

## 2014-11-07 NOTE — Telephone Encounter (Signed)
It was [facility].

## 2014-12-18 ENCOUNTER — Other Ambulatory Visit: Payer: Self-pay | Admitting: Family Medicine

## 2014-12-18 DIAGNOSIS — F419 Anxiety disorder, unspecified: Secondary | ICD-10-CM

## 2014-12-18 MED ORDER — CLONAZEPAM 1 MG PO TABS: ORAL_TABLET | ORAL | Status: DC

## 2015-01-16 ENCOUNTER — Encounter: Payer: Self-pay | Admitting: Family Medicine

## 2015-01-16 DIAGNOSIS — F988 Other specified behavioral and emotional disorders with onset usually occurring in childhood and adolescence: Secondary | ICD-10-CM

## 2015-01-17 MED ORDER — AMPHETAMINE-DEXTROAMPHETAMINE 20 MG PO TABS: 20.0000 mg | ORAL_TABLET | Freq: Two times a day (BID) | ORAL | Status: DC

## 2015-01-17 MED ORDER — AMPHETAMINE-DEXTROAMPHETAMINE 20 MG PO TABS: ORAL_TABLET | ORAL | Status: DC

## 2015-01-22 ENCOUNTER — Other Ambulatory Visit: Payer: Self-pay | Admitting: Family Medicine

## 2015-01-22 DIAGNOSIS — F419 Anxiety disorder, unspecified: Secondary | ICD-10-CM

## 2015-01-22 MED ORDER — CLONAZEPAM 1 MG PO TABS: ORAL_TABLET | ORAL | Status: DC

## 2015-02-05 ENCOUNTER — Ambulatory Visit: Payer: Self-pay | Admitting: Family Medicine

## 2015-02-13 ENCOUNTER — Encounter: Payer: Self-pay | Admitting: Family Medicine

## 2015-02-13 ENCOUNTER — Ambulatory Visit (INDEPENDENT_AMBULATORY_CARE_PROVIDER_SITE_OTHER): Payer: BLUE CROSS/BLUE SHIELD | Admitting: Family Medicine

## 2015-02-13 VITALS — BP 123/79 | HR 92 | Temp 97.9°F | Resp 16 | Ht 68.0 in | Wt 154.0 lb

## 2015-02-13 DIAGNOSIS — R197 Diarrhea, unspecified: Secondary | ICD-10-CM | POA: Diagnosis not present

## 2015-02-13 DIAGNOSIS — R112 Nausea with vomiting, unspecified: Secondary | ICD-10-CM

## 2015-02-13 MED ORDER — ONDANSETRON 4 MG PO TBDP: 4.0000 mg | ORAL_TABLET | Freq: Three times a day (TID) | ORAL | Status: DC | PRN

## 2015-02-13 NOTE — Progress Notes (Signed)
Subjective:    Patient ID: [name] [name], female    DOB: 1990/10/01, 24 y.o.   MRN: [id number]  HPI This is a very pleasant 24 yo female who presents today with 3 day history of nausea and diarrhea.  Has had stools every time she eats or drinks everything. She estimates 15-20 stools daily for last 2 days. Stool is brown and watery. No blood, no mucous, no dark stools. She had some incontinence of stool x 2 yesterday. Has not been eating or drinking much of anything since it goes right through her. Has been sipping water. Nausea worse this morning. No vomiting in 2 days. She tried to eat a cobb salad yesterday, but was unable to keep it down. Had a small amount of bagel and it seemed to "go right through." Has not taken any medication for her symptoms.  No recent foreign travel or camping. She attended a wedding the day before her symptoms started. At first she thought she felt bad due to having several alcoholic beverages the night before. No one else is sick. Her in laws are helping with her daughter.  Past Medical History  Diagnosis Date  . Anxiety   . ADHD (attention deficit hyperactivity disorder)   . Panic attacks   . Generalized headaches   . Vaginal Pap smear, abnormal    Past Surgical History  Procedure Laterality Date  . Wisdom teeth removal Bilateral     2010  . Induced abortion     Family History  Problem Relation Age of Onset  . Hypertension Mother   . Heart disease Father   . Cancer Father     testicular  . Hypertension Father   . Hyperlipidemia Father   . Heart disease Paternal Grandfather    History  Substance Use Topics  . Smoking status: Former Smoker    Types: Cigarettes    Quit date: 08/15/2013  . Smokeless tobacco: Never Used  . Alcohol Use: 1.2 oz/week    2 Standard drinks or equivalent per week     Comment: socially    Review of Systems Subjective fever/chills, abdomen feels "sore," no unusual headache. Feels weak and a little dizzy, no falls.       Objective:   Physical Exam  Constitutional: She is oriented to person, place, and time. She appears well-developed and well-nourished. No distress.  HENT:  Head: Normocephalic and atraumatic.  Right Ear: External ear normal.  Left Ear: External ear normal.  Nose: Nose normal.  Eyes: Conjunctivae are normal.  Neck: Normal range of motion. Neck supple.  Cardiovascular: Normal rate, regular rhythm and normal heart sounds.   Pulmonary/Chest: Effort normal and breath sounds normal.  Abdominal: Soft. Bowel sounds are normal. She exhibits no distension and no mass. There is no hepatosplenomegaly. There is generalized tenderness. There is no rigidity, no rebound, no guarding, no tenderness at McBurney's point and negative Murphy's sign.  Mild generalized abdominal tenderness.   Musculoskeletal: Normal range of motion.  Lymphadenopathy:    She has no cervical adenopathy.  Neurological: She is alert and oriented to person, place, and time.  Skin: Skin is warm and dry. She is not diaphoretic.  Good skin turgor.   Psychiatric: She has a normal mood and affect. Her behavior is normal. Judgment and thought content normal.  Vitals reviewed.  BP 123/79 mmHg  Pulse 92  Temp(Src) 97.9 F (36.6 C)  Resp 16  Ht  (1.727 m)  Wt 154 lb (69.854 kg)  BMI  23.42 kg/m2  SpO2 99%  LMP 01/30/2015     Assessment & Plan:  1. Diarrhea - Patient in no distress and looks hydrated, will work up stool given severity of her diarrhea.  - Ova and parasite examination - Stool culture - Fecal Lactoferrin - Cryptosporidium Antigen, Stool - Clostridium Difficile by PCR (not at [facility]) - provided written and verbal instructions regarding BRAT diet and encouraged her to hydrate by drinking small frequent amounts of Gatorade and slowly advancing diet. - can take 1/2 Immodium dosage if diarrhea not improving  2. Non-intractable vomiting with nausea, vomiting of unspecified type - ondansetron (ZOFRAN-ODT) 4 MG  disintegrating tablet; Take 1 tablet (4 mg total) by mouth every 8 (eight) hours as needed for nausea.  Dispense: 20 tablet; Refill: 0  - Will call her regarding labs and to check on her tomorrow. If she has any worsening symptoms she is to go to ER or RTC.  Olean Reeeborah Makana Feigel, FNP-BC  [facility]  02/13/2015 9:31 PM

## 2015-02-13 NOTE — Patient Instructions (Signed)
Try Balenol for your bottom Can take half of an immodium Follow up if no improvement in 24 hours  Food Choices to Help Relieve Diarrhea When you have diarrhea, the foods you eat and your eating habits are very important. Choosing the right foods and drinks can help relieve diarrhea. Also, because diarrhea can last up to 7 days, you need to replace lost fluids and electrolytes (such as sodium, potassium, and chloride) in order to help prevent dehydration.  WHAT GENERAL GUIDELINES DO I NEED TO FOLLOW?  Slowly drink 1 cup (8 oz) of fluid for each episode of diarrhea. If you are getting enough fluid, your urine will be clear or pale yellow.  Eat starchy foods. Some good choices include white rice, white toast, pasta, low-fiber cereal, baked potatoes (without the skin), saltine crackers, and bagels.  Avoid large servings of any cooked vegetables.  Limit fruit to two servings per day. A serving is  cup or 1 small piece.  Choose foods with less than 2 g of fiber per serving.  Limit fats to less than 8 tsp (38 g) per day.  Avoid fried foods.  Eat foods that have probiotics in them. Probiotics can be found in certain dairy products.  Avoid foods and beverages that may increase the speed at which food moves through the stomach and intestines (gastrointestinal tract). Things to avoid include:  High-fiber foods, such as dried fruit, raw fruits and vegetables, nuts, seeds, and whole grain foods.  Spicy foods and high-fat foods.  Foods and beverages sweetened with high-fructose corn syrup, honey, or sugar alcohols such as xylitol, sorbitol, and mannitol. WHAT FOODS ARE RECOMMENDED? Grains White rice. White, [place], or pita breads (fresh or toasted), including plain rolls, buns, or bagels. White pasta. Saltine, soda, or graham crackers. Pretzels. Low-fiber cereal. Cooked cereals made with water (such as cornmeal, farina, or cream cereals). Plain muffins. Matzo. Melba toast. Zwieback.   Vegetables Potatoes (without the skin). Strained tomato and vegetable juices. Most well-cooked and canned vegetables without seeds. Tender lettuce. Fruits Cooked or canned applesauce, apricots, cherries, fruit cocktail, grapefruit, peaches, pears, or plums. Fresh bananas, apples without skin, cherries, grapes, cantaloupe, grapefruit, peaches, oranges, or plums.  Meat and Other Protein Products Baked or boiled chicken. Eggs. Tofu. Fish. Seafood. Smooth peanut butter. Ground or well-cooked tender beef, ham, veal, lamb, pork, or poultry.  Dairy Plain yogurt, kefir, and unsweetened liquid yogurt. Lactose-free milk, buttermilk, or soy milk. Plain hard cheese. Beverages Sport drinks. Clear broths. Diluted fruit juices (except prune). Regular, caffeine-free sodas such as ginger ale. Water. Decaffeinated teas. Oral rehydration solutions. Sugar-free beverages not sweetened with sugar alcohols. Other Bouillon, broth, or soups made from recommended foods.  The items listed above may not be a complete list of recommended foods or beverages. Contact your dietitian for more options. WHAT FOODS ARE NOT RECOMMENDED? Grains Whole grain, whole wheat, bran, or rye breads, rolls, pastas, crackers, and cereals. Wild or brown rice. Cereals that contain more than 2 g of fiber per serving. Corn tortillas or taco shells. Cooked or dry oatmeal. Granola. Popcorn. Vegetables Raw vegetables. Cabbage, broccoli, Brussels sprouts, artichokes, baked beans, beet greens, corn, kale, legumes, peas, sweet potatoes, and yams. Potato skins. Cooked spinach and cabbage. Fruits Dried fruit, including raisins and dates. Raw fruits. Stewed or dried prunes. Fresh apples with skin, apricots, mangoes, pears, raspberries, and strawberries.  Meat and Other Protein Products Chunky peanut butter. Nuts and seeds. Beans and lentils. Tomasa Blase.  Dairy High-fat cheeses. Milk, chocolate milk, and beverages  made with milk, such as milk shakes. Cream.  Ice cream. Sweets and Desserts Sweet rolls, doughnuts, and sweet breads. Pancakes and waffles. Fats and Oils Butter. Cream sauces. Margarine. Salad oils. Plain salad dressings. Olives. Avocados.  Beverages Caffeinated beverages (such as coffee, tea, soda, or energy drinks). Alcoholic beverages. Fruit juices with pulp. Prune juice. Soft drinks sweetened with high-fructose corn syrup or sugar alcohols. Other Coconut. Hot sauce. Chili powder. Mayonnaise. Gravy. Cream-based or milk-based soups.  The items listed above may not be a complete list of foods and beverages to avoid. Contact your dietitian for more information. WHAT SHOULD I DO IF I BECOME DEHYDRATED? Diarrhea can sometimes lead to dehydration. Signs of dehydration include dark urine and dry mouth and skin. If you think you are dehydrated, you should rehydrate with an oral rehydration solution. These solutions can be purchased at pharmacies, retail stores, or online.  Drink -1 cup (120-240 mL) of oral rehydration solution each time you have an episode of diarrhea. If drinking this amount makes your diarrhea worse, try drinking smaller amounts more often. For example, drink 1-3 tsp (5-15 mL) every 5-10 minutes.  A general rule for staying hydrated is to drink 1-2 L of fluid per day. Talk to your health care provider about the specific amount you should be drinking each day. Drink enough fluids to keep your urine clear or pale yellow. Document Released: 11/22/2003 Document Revised: 09/06/2013 Document Reviewed: 07/25/2013 [facility] Patient Information 2015 [place] [place]. This information is not intended to replace advice given to you by your health care provider. Make sure you discuss any questions you have with your health care provider.

## 2015-02-14 LAB — CLOSTRIDIUM DIFFICILE BY PCR: Toxigenic C. Difficile by PCR: NOT DETECTED

## 2015-02-14 LAB — OVA AND PARASITE EXAMINATION: OP: NONE SEEN

## 2015-02-14 LAB — FECAL LACTOFERRIN, QUANT: Lactoferrin: POSITIVE

## 2015-02-15 ENCOUNTER — Other Ambulatory Visit: Payer: Self-pay | Admitting: Family Medicine

## 2015-02-15 DIAGNOSIS — R197 Diarrhea, unspecified: Secondary | ICD-10-CM

## 2015-02-15 LAB — CRYPTOSPORIDIUM ANTIGEN, STOOL: Cryptosporidium Screen (EIA): NEGATIVE

## 2015-02-15 MED ORDER — METRONIDAZOLE 500 MG PO TABS: 500.0000 mg | ORAL_TABLET | Freq: Three times a day (TID) | ORAL | Status: DC

## 2015-02-17 LAB — STOOL CULTURE

## 2015-02-26 ENCOUNTER — Other Ambulatory Visit: Payer: Self-pay | Admitting: Family Medicine

## 2015-02-26 DIAGNOSIS — F419 Anxiety disorder, unspecified: Secondary | ICD-10-CM

## 2015-02-26 MED ORDER — CLONAZEPAM 1 MG PO TABS: ORAL_TABLET | ORAL | Status: DC

## 2015-02-26 NOTE — Addendum Note (Signed)
Addended by: Abbe Amsterdam C on: 02/26/2015 01:09 PM   Modules accepted: Orders

## 2015-03-29 ENCOUNTER — Other Ambulatory Visit: Payer: Self-pay | Admitting: Family Medicine

## 2015-03-29 DIAGNOSIS — F419 Anxiety disorder, unspecified: Secondary | ICD-10-CM

## 2015-03-29 MED ORDER — CLONAZEPAM 1 MG PO TABS: ORAL_TABLET | ORAL | Status: DC

## 2015-04-23 ENCOUNTER — Ambulatory Visit: Payer: Self-pay | Admitting: Family Medicine

## 2015-05-02 ENCOUNTER — Ambulatory Visit (INDEPENDENT_AMBULATORY_CARE_PROVIDER_SITE_OTHER): Payer: BLUE CROSS/BLUE SHIELD | Admitting: Family Medicine

## 2015-05-02 VITALS — BP 122/72 | HR 73 | Temp 98.4°F | Resp 17 | Ht 67.5 in | Wt 149.0 lb

## 2015-05-02 DIAGNOSIS — F909 Attention-deficit hyperactivity disorder, unspecified type: Secondary | ICD-10-CM

## 2015-05-02 DIAGNOSIS — F988 Other specified behavioral and emotional disorders with onset usually occurring in childhood and adolescence: Secondary | ICD-10-CM

## 2015-05-02 MED ORDER — AMPHETAMINE-DEXTROAMPHETAMINE 20 MG PO TABS: ORAL_TABLET | ORAL | Status: DC

## 2015-05-02 MED ORDER — AMPHETAMINE-DEXTROAMPHETAMINE 20 MG PO TABS: 20.0000 mg | ORAL_TABLET | Freq: Two times a day (BID) | ORAL | Status: DC

## 2015-05-02 NOTE — Progress Notes (Signed)
[facility] [address] [place] [address] [phone]  Date:  05/02/2015   Name:  [name] [name]   DOB:  14-Jun-1991   MRN:  [id number]  PCP:  No primary care provider on file.    Chief Complaint: Medication Refill   History of Present Illness:  [name] [name] is a 24 y.o. very pleasant female patient who presents with the following:  Here today for an adderall RF. Last seen in February to establish care.  She moved here from [place] this year.  Her daughter is 14 months old and does have a mitochondrial issue which is being managed by a clinic at [facility].    She is able to sleep ok, feels like adderall is helping her with her ADHD dx.  She is also exercising a lot to manage stress She is not nursing.  She is on OCP and does not plan another pregnancy soon  She does take klonpin as needed for anxiety and sleep - refilled this last month Patient Active Problem List   Diagnosis Date Noted  . ADHD (attention deficit hyperactivity disorder) 10/27/2014  . GAD (generalized anxiety disorder) 10/27/2014  . Gestational hypertension 09/07/2014  . Normal labor 09/06/2014    Past Medical History  Diagnosis Date  . Anxiety   . ADHD (attention deficit hyperactivity disorder)   . Panic attacks   . Generalized headaches   . Vaginal Pap smear, abnormal     Past Surgical History  Procedure Laterality Date  . Wisdom teeth removal Bilateral     2010  . Induced abortion      Social History  Substance Use Topics  . Smoking status: Former Smoker    Types: Cigarettes    Quit date: 08/15/2013  . Smokeless tobacco: Never Used  . Alcohol Use: 1.2 oz/week    2 Standard drinks or equivalent per week     Comment: socially    Family History  Problem Relation Age of Onset  . Hypertension Mother   . Heart disease Father   . Cancer Father     testicular  . Hypertension Father   . Hyperlipidemia Father   . Heart disease Paternal Grandfather     Allergies   Allergen Reactions  . Azithromycin Hives  . Ceclor [Cefaclor] Hives    Medication list has been reviewed and updated.  Current Outpatient Prescriptions on File Prior to Visit  Medication Sig Dispense Refill  . amphetamine-dextroamphetamine (ADDERALL) 20 MG tablet Take 1 tablet (20 mg total) by mouth 2 (two) times daily. Ok to fill 30 days after rx 60 tablet 0  . amphetamine-dextroamphetamine (ADDERALL) 20 MG tablet Take twice a day. 60 tablet 0  . amphetamine-dextroamphetamine (ADDERALL) 20 MG tablet Take 1 tablet (20 mg total) by mouth 2 (two) times daily. Ok to fill 60 days after rx 60 tablet 0  . clonazePAM (KLONOPIN) 1 MG tablet Take 1/2 or 1 tablet once or twice a day as needed for anxiety 40 tablet 1  . norgestimate-ethinyl estradiol (ORTHO-CYCLEN,SPRINTEC,PREVIFEM) 0.25-35 MG-MCG tablet Take 1 tablet by mouth daily.     No current facility-administered medications on file prior to visit.    Review of Systems:  As per HPI- otherwise negative.   Physical Examination: Filed Vitals:   05/02/15 0811  BP: 122/72  Pulse: 73  Temp: 98.4 F (36.9 C)  Resp: 17   Filed Vitals:   05/02/15 0811  Height: 5' 7.5" (1.715 m)  Weight: 149 lb (67.586  kg)   Body mass index is 22.98 kg/(m^2). Ideal Body Weight: Weight in (lb) to have BMI = 25: 161.7  GEN: WDWN, NAD, Non-toxic, A & O x 3, looks well, normal weight HEENT: Atraumatic, Normocephalic. Neck supple. No masses, No LAD. Ears and Nose: No external deformity. CV: RRR, No M/G/R. No JVD. No thrill. No extra heart sounds. PULM: CTA B, no wheezes, crackles, rhonchi. No retractions. No resp. distress. No accessory muscle use. EXTR: No c/c/e NEURO Normal gait.  PSYCH: Normally interactive. Conversant. Not depressed or anxious appearing.  Calm demeanor.    Assessment and Plan: ADD (attention deficit disorder) - Plan: amphetamine-dextroamphetamine (ADDERALL) 20 MG tablet, amphetamine-dextroamphetamine (ADDERALL) 20 MG tablet,  amphetamine-dextroamphetamine (ADDERALL) 20 MG tablet  Refilled her adderall for 3 months.  She will contact me in about 3 months for refill and will see me in about 6 months.    Signed Abbe Amsterdam, MD

## 2015-05-23 ENCOUNTER — Ambulatory Visit: Payer: Self-pay | Admitting: Family Medicine

## 2015-06-06 LAB — OB RESULTS CONSOLE HGB/HCT, BLOOD
HCT: 37 %
Hemoglobin: 12.7 g/dL

## 2015-06-06 LAB — OB RESULTS CONSOLE HIV ANTIBODY (ROUTINE TESTING): HIV: NONREACTIVE

## 2015-06-06 LAB — OB RESULTS CONSOLE RPR: RPR: NONREACTIVE

## 2015-06-06 LAB — OB RESULTS CONSOLE PLATELET COUNT: Platelets: 300 10*3/uL

## 2015-06-06 LAB — OB RESULTS CONSOLE HEPATITIS B SURFACE ANTIGEN: Hepatitis B Surface Ag: NEGATIVE

## 2015-06-12 ENCOUNTER — Encounter: Payer: Self-pay | Admitting: Family Medicine

## 2015-06-12 DIAGNOSIS — F419 Anxiety disorder, unspecified: Secondary | ICD-10-CM

## 2015-06-12 MED ORDER — CLONAZEPAM 1 MG PO TABS: ORAL_TABLET | ORAL | Status: DC

## 2015-08-13 ENCOUNTER — Other Ambulatory Visit: Payer: Self-pay | Admitting: Family Medicine

## 2015-08-13 ENCOUNTER — Encounter: Payer: Self-pay | Admitting: Family Medicine

## 2015-08-13 DIAGNOSIS — F988 Other specified behavioral and emotional disorders with onset usually occurring in childhood and adolescence: Secondary | ICD-10-CM

## 2015-08-13 MED ORDER — AMPHETAMINE-DEXTROAMPHETAMINE 20 MG PO TABS: 20.0000 mg | ORAL_TABLET | Freq: Two times a day (BID) | ORAL | Status: DC

## 2015-08-13 MED ORDER — AMPHETAMINE-DEXTROAMPHETAMINE 20 MG PO TABS: ORAL_TABLET | ORAL | Status: DC

## 2015-08-15 ENCOUNTER — Encounter: Payer: Self-pay | Admitting: Family Medicine

## 2015-08-15 DIAGNOSIS — F419 Anxiety disorder, unspecified: Secondary | ICD-10-CM

## 2015-08-15 MED ORDER — CLONAZEPAM 1 MG PO TABS: ORAL_TABLET | ORAL | Status: DC

## 2015-09-16 NOTE — L&D Delivery Note (Signed)
Delivery Note At 1:20 PM a viable female was delivered via  (Presentation: ;  ).  APGAR: , ; weight  .   Placenta status:spont, intact , .  Cord:mod tight nuchal cord X 2  with the following complications: .  Cord pH: not sent  Anesthesia:  epid Episiotomy: none  Lacerations:  First deg Suture Repair: 3.0 vicryl rapide Est. Blood Loss (mL):  200  Mom to postpartum.  Baby to Couplet care / Skin to Skin.  [name],[name] Letterman M 12/27/2015, 1:31 PM

## 2015-09-28 ENCOUNTER — Encounter (HOSPITAL_COMMUNITY): Payer: Self-pay

## 2015-09-28 ENCOUNTER — Inpatient Hospital Stay (HOSPITAL_COMMUNITY)
Admission: AD | Admit: 2015-09-28 | Discharge: 2015-09-28 | Disposition: A | Discharge status: Home / Self Care | Payer: BLUE CROSS/BLUE SHIELD | Source: Ambulatory Visit | Attending: Obstetrics & Gynecology | Admitting: Obstetrics & Gynecology

## 2015-09-28 DIAGNOSIS — Z79899 Other long term (current) drug therapy: Secondary | ICD-10-CM | POA: Insufficient documentation

## 2015-09-28 DIAGNOSIS — R112 Nausea with vomiting, unspecified: Secondary | ICD-10-CM | POA: Diagnosis not present

## 2015-09-28 DIAGNOSIS — F419 Anxiety disorder, unspecified: Secondary | ICD-10-CM | POA: Diagnosis not present

## 2015-09-28 DIAGNOSIS — O219 Vomiting of pregnancy, unspecified: Secondary | ICD-10-CM | POA: Diagnosis not present

## 2015-09-28 DIAGNOSIS — K529 Noninfective gastroenteritis and colitis, unspecified: Secondary | ICD-10-CM | POA: Diagnosis not present

## 2015-09-28 DIAGNOSIS — F909 Attention-deficit hyperactivity disorder, unspecified type: Secondary | ICD-10-CM | POA: Diagnosis not present

## 2015-09-28 DIAGNOSIS — O99340 Other mental disorders complicating pregnancy, unspecified trimester: Secondary | ICD-10-CM | POA: Insufficient documentation

## 2015-09-28 DIAGNOSIS — Z87891 Personal history of nicotine dependence: Secondary | ICD-10-CM | POA: Diagnosis not present

## 2015-09-28 DIAGNOSIS — O26899 Other specified pregnancy related conditions, unspecified trimester: Secondary | ICD-10-CM | POA: Insufficient documentation

## 2015-09-28 LAB — URINALYSIS, ROUTINE W REFLEX MICROSCOPIC
Bilirubin Urine: NEGATIVE
Glucose, UA: NEGATIVE mg/dL
Hgb urine dipstick: NEGATIVE
Ketones, ur: 15 mg/dL — AB
Nitrite: NEGATIVE
Protein, ur: NEGATIVE mg/dL
Specific Gravity, Urine: 1.02 (ref 1.005–1.030)
pH: 6.5 (ref 5.0–8.0)

## 2015-09-28 LAB — URINE MICROSCOPIC-ADD ON: RBC / HPF: NONE SEEN RBC/hpf (ref 0–5)

## 2015-09-28 MED ORDER — M.V.I. ADULT IV INJ
Freq: Once | INTRAVENOUS | Status: AC
  Administered 2015-09-28: 10:00:00 via INTRAVENOUS
  Filled 2015-09-28: qty 10

## 2015-09-28 MED ORDER — PROMETHAZINE HCL 25 MG/ML IJ SOLN
25.0000 mg | Freq: Once | INTRAVENOUS | Status: AC
  Administered 2015-09-28: 25 mg via INTRAVENOUS
  Filled 2015-09-28: qty 1

## 2015-09-28 MED ORDER — PROMETHAZINE HCL 12.5 MG PO TABS: 12.5000 mg | ORAL_TABLET | Freq: Four times a day (QID) | ORAL | Status: DC | PRN

## 2015-09-28 NOTE — MAU Provider Note (Signed)
History     CSN: [id number]  Arrival date and time: 09/28/15 2130   First Provider Initiated Contact with Patient 09/28/15 0747      Chief Complaint  Patient presents with  . Emesis  . Diarrhea   Emesis  This is a new problem. The current episode started today. The problem occurs more than 10 times per day. The problem has been unchanged. The emesis has an appearance of stomach contents. There has been no fever. Associated symptoms include diarrhea. Pertinent negatives include no abdominal pain, chills or fever. She has tried acetaminophen for the symptoms. The treatment provided no relief.  Diarrhea  This is a new problem. The current episode started today. The problem occurs 5 to 10 times per day. The problem has been unchanged. The stool consistency is described as watery. The patient states that diarrhea awakens her from sleep. Associated symptoms include vomiting. Pertinent negatives include no abdominal pain, chills or fever. Nothing aggravates the symptoms. She has tried nothing for the symptoms.    Past Medical History  Diagnosis Date  . Anxiety   . ADHD (attention deficit hyperactivity disorder)   . Panic attacks   . Generalized headaches   . Vaginal Pap smear, abnormal     Past Surgical History  Procedure Laterality Date  . Wisdom teeth removal Bilateral     2010  . Induced abortion      Family History  Problem Relation Age of Onset  . Hypertension Mother   . Heart disease Father   . Cancer Father     testicular  . Hypertension Father   . Hyperlipidemia Father   . Heart disease Paternal Grandfather     Social History  Substance Use Topics  . Smoking status: Former Smoker    Types: Cigarettes    Quit date: 08/15/2013  . Smokeless tobacco: Never Used  . Alcohol Use: 1.2 oz/week    2 Standard drinks or equivalent per week     Comment: socially    Allergies:  Allergies  Allergen Reactions  . Azithromycin Hives  . Ceclor [Cefaclor] Hives     Prescriptions prior to admission  Medication Sig Dispense Refill Last Dose  . amphetamine-dextroamphetamine (ADDERALL) 20 MG tablet Take 1 tablet (20 mg total) by mouth 2 (two) times daily. Ok to fill 30 days after rx 60 tablet 0   . amphetamine-dextroamphetamine (ADDERALL) 20 MG tablet Take twice a day. 60 tablet 0   . amphetamine-dextroamphetamine (ADDERALL) 20 MG tablet Take 1 tablet (20 mg total) by mouth 2 (two) times daily. Ok to fill 60 days after rx 60 tablet 0   . clonazePAM (KLONOPIN) 1 MG tablet Take 1/2 or 1 tablet once or twice a day as needed for anxiety 40 tablet 1   . norgestimate-ethinyl estradiol (ORTHO-CYCLEN,SPRINTEC,PREVIFEM) 0.25-35 MG-MCG tablet Take 1 tablet by mouth daily.   Taking    Review of Systems  Constitutional: Negative for fever and chills.  Gastrointestinal: Positive for nausea, vomiting and diarrhea. Negative for abdominal pain and constipation.  Genitourinary: Negative for dysuria, urgency and frequency.   Physical Exam   Blood pressure 107/72, pulse 93, temperature 98.2 F (36.8 C), temperature source Oral, resp. rate 18, height 5' 7.5" (1.715 m), weight 74.844 kg (165 lb), last menstrual period 03/19/2015, not currently breastfeeding.  Physical Exam  Nursing note and vitals reviewed. Constitutional: She is oriented to person, place, and time. She appears well-developed and well-nourished. No distress.  HENT:  Head: Normocephalic.  Cardiovascular: Normal rate.  Respiratory: Effort normal.  GI: Soft. There is no tenderness. There is no rebound.  Neurological: She is alert and oriented to person, place, and time.  Skin: Skin is warm and dry.  Psychiatric: She has a normal mood and affect.   Results for orders placed or performed during the hospital encounter of 09/28/15 (from the past 24 hour(s))  Urinalysis, Routine w reflex microscopic (not [facility])     Status: Abnormal   Collection Time: 09/28/15  7:26 AM  Result Value Ref Range    Color, Urine YELLOW YELLOW   APPearance HAZY (A) CLEAR   Specific Gravity, Urine 1.020 1.005 - 1.030   pH 6.5 5.0 - 8.0   Glucose, UA NEGATIVE NEGATIVE mg/dL   Hgb urine dipstick NEGATIVE NEGATIVE   Bilirubin Urine NEGATIVE NEGATIVE   Ketones, ur 15 (A) NEGATIVE mg/dL   Protein, ur NEGATIVE NEGATIVE mg/dL   Nitrite NEGATIVE NEGATIVE   Leukocytes, UA SMALL (A) NEGATIVE  Urine microscopic-add on     Status: Abnormal   Collection Time: 09/28/15  7:26 AM  Result Value Ref Range   Squamous Epithelial / LPF 0-5 (A) NONE SEEN   WBC, UA 0-5 0 - 5 WBC/hpf   RBC / HPF NONE SEEN 0 - 5 RBC/hpf   Bacteria, UA FEW (A) NONE SEEN   Urine-Other MUCOUS PRESENT     MAU Course  Procedures  MDM 0801: Care turned over to Nada MaclachlanKaren Teague Clark IV fluids pending   Tawnya CrookHogan, Heather Donovan  09/28/2015 8:02 AM  Pt feeling improved with phenergan and 1L IV fluids.  Will give additional liter D5LR with multivitamins.   Discussed with Dr. Rana SnareLowe.  He is in agreement for discharge with rx for phenergan  Assessment and Plan  A:  1. Nausea and vomiting during pregnancy   2. Gastroenteritis    P: Discharge to home Rx for phenergan - pt prefers oral Po fluids encouraged F/u in clinic as scheduled/prn Dietary recommendations discussed Patient may return to MAU as needed or if her condition were to change or worsen

## 2015-09-28 NOTE — Discharge Instructions (Signed)

## 2015-09-28 NOTE — MAU Note (Signed)
Pt c/o vomiting starting last night with violent vomiting and diarrhea. Pt cannot keep anything down even water. Pt c/o painful urination.

## 2015-10-07 ENCOUNTER — Inpatient Hospital Stay (HOSPITAL_COMMUNITY)
Admission: AD | Admit: 2015-10-07 | Discharge: 2015-10-07 | Disposition: A | Discharge status: Home / Self Care | Payer: BLUE CROSS/BLUE SHIELD | Source: Ambulatory Visit | Attending: Obstetrics & Gynecology | Admitting: Obstetrics & Gynecology

## 2015-10-07 ENCOUNTER — Encounter (HOSPITAL_COMMUNITY): Payer: Self-pay

## 2015-10-07 DIAGNOSIS — O212 Late vomiting of pregnancy: Secondary | ICD-10-CM | POA: Diagnosis present

## 2015-10-07 DIAGNOSIS — F419 Anxiety disorder, unspecified: Secondary | ICD-10-CM | POA: Insufficient documentation

## 2015-10-07 DIAGNOSIS — K529 Noninfective gastroenteritis and colitis, unspecified: Secondary | ICD-10-CM | POA: Diagnosis not present

## 2015-10-07 DIAGNOSIS — O219 Vomiting of pregnancy, unspecified: Secondary | ICD-10-CM | POA: Diagnosis not present

## 2015-10-07 DIAGNOSIS — Z3A27 27 weeks gestation of pregnancy: Secondary | ICD-10-CM | POA: Insufficient documentation

## 2015-10-07 DIAGNOSIS — Z87891 Personal history of nicotine dependence: Secondary | ICD-10-CM | POA: Insufficient documentation

## 2015-10-07 DIAGNOSIS — F909 Attention-deficit hyperactivity disorder, unspecified type: Secondary | ICD-10-CM | POA: Diagnosis not present

## 2015-10-07 DIAGNOSIS — O26892 Other specified pregnancy related conditions, second trimester: Secondary | ICD-10-CM | POA: Diagnosis not present

## 2015-10-07 LAB — URINALYSIS, ROUTINE W REFLEX MICROSCOPIC
Bilirubin Urine: NEGATIVE
Glucose, UA: NEGATIVE mg/dL
Hgb urine dipstick: NEGATIVE
Ketones, ur: NEGATIVE mg/dL
Nitrite: NEGATIVE
Protein, ur: 30 mg/dL — AB
Specific Gravity, Urine: 1.02 (ref 1.005–1.030)
pH: 7 (ref 5.0–8.0)

## 2015-10-07 LAB — URINE MICROSCOPIC-ADD ON: RBC / HPF: NONE SEEN RBC/hpf (ref 0–5)

## 2015-10-07 MED ORDER — DEXTROSE 5 % IN LACTATED RINGERS IV BOLUS: 1000.0000 mL | Freq: Once | INTRAVENOUS | Status: DC

## 2015-10-07 MED ORDER — ONDANSETRON 4 MG PO TBDP: 4.0000 mg | ORAL_TABLET | Freq: Three times a day (TID) | ORAL | Status: DC | PRN

## 2015-10-07 MED ORDER — PROMETHAZINE HCL 25 MG/ML IJ SOLN
25.0000 mg | INTRAMUSCULAR | Status: AC
  Administered 2015-10-07: 25 mg via INTRAVENOUS
  Filled 2015-10-07: qty 1

## 2015-10-07 NOTE — MAU Provider Note (Signed)
History     CSN: [id number]  Arrival date and time: 10/07/15 8469   First Provider Initiated Contact with Patient 10/07/15 1039      Chief Complaint  Patient presents with  . Emesis During Pregnancy  . Diarrhea   HPI LORRI FUKUHARA 25 y.o. [id number]  presents to MAU complaining of nausea, vomiting, diarrhea.  The vomiting came on at midnight - 9x since.  She has had 10 episodes of watery diarrhea.  Her abdomen has been somewhat crampy related to vomiting and diarrhea only.  She denies contractions, vaginal bleeding, LOF, dysuria. Her baby is moving well.   OB History    Gravida Para Term Preterm AB TAB SAB Ectopic Multiple Living   0 1      Past Medical History  Diagnosis Date  . Anxiety   . ADHD (attention deficit hyperactivity disorder)   . Panic attacks   . Generalized headaches   . Vaginal Pap smear, abnormal     Past Surgical History  Procedure Laterality Date  . Wisdom teeth removal Bilateral     2010  . Induced abortion      Family History  Problem Relation Age of Onset  . Hypertension Mother   . Heart disease Father   . Cancer Father     testicular  . Hypertension Father   . Hyperlipidemia Father   . Heart disease Paternal Grandfather     Social History  Substance Use Topics  . Smoking status: Former Smoker    Types: Cigarettes    Quit date: 08/15/2013  . Smokeless tobacco: Never Used  . Alcohol Use: 1.2 oz/week    2 Standard drinks or equivalent per week     Comment: socially    Allergies:  Allergies  Allergen Reactions  . Ceclor [Cefaclor] Hives    Prescriptions prior to admission  Medication Sig Dispense Refill Last Dose  . Prenatal Vit-Fe Fumarate-FA (PRENATAL MULTIVITAMIN) TABS tablet Take 1 tablet by mouth daily at 12 noon.   10/06/2015 at Unknown time  . promethazine (PHENERGAN) 12.5 MG tablet Take 1 tablet (12.5 mg total) by mouth every 6 (six) hours as needed for nausea or vomiting. 30 tablet 0 10/07/2015 at Unknown  time    ROS Pertinent ROS in HPI.  All other systems are negative.   Physical Exam   Blood pressure 135/84, pulse 105, temperature 97.6 F (36.4 C), temperature source Oral, resp. rate 18, last menstrual period 03/19/2015, not currently breastfeeding.  Physical Exam  Constitutional: She is oriented to person, place, and time. She appears well-developed and well-nourished. No distress.  HENT:  Head: Normocephalic and atraumatic.  Eyes: Conjunctivae and EOM are normal.  Neck: Normal range of motion. Neck supple.  Cardiovascular: Normal rate and normal heart sounds.   Respiratory: Effort normal and breath sounds normal. No respiratory distress.  GI: Soft. She exhibits no distension. There is no tenderness. There is no rebound and no guarding.  Musculoskeletal: Normal range of motion.  Neurological: She is alert and oriented to person, place, and time.  Skin: Skin is warm and dry.  Psychiatric: She has a normal mood and affect. Her behavior is normal.   Fetal Tracing: Baseline: 140s Variability:mod Accelerations: 10x10s Decelerations:none Toco:none   MAU Course  Procedures  MDM IVF along with IV phenergan ordered Pt notes feeling improved.  No vomiting or diarrhea.  Given crackers and soda.  Able to keep down without issue.   Dr. Langston Masker  consulted.  She is agreeable to discharge of pt with rx for zofran  if pt prefers.  F/u in clinic as scheduled/as needed.  Collect stool sample if diarrhea continues  Assessment and Plan  A: Gastroenteritis Nausea/vomiting in pregnancy  P: DIscharge to home  Zofran rx for prn nausea Push PO fluids Diet discussed to minimize diarrhea Keep [facility] appts Patient may return to MAU as needed or if her condition were to change or worsen   Bertram Denver 10/07/2015, 10:41 AM

## 2015-10-07 NOTE — Discharge Instructions (Signed)

## 2015-10-07 NOTE — MAU Note (Signed)
Pt C/O vomiting & diarrhea since midnight last night.  Pt was here last week for same thing, had gotten better.  Took promethazine this morning, worked for about 2 hours, now vomiting again.  Has had approx 10 diarrhea stools since this started.

## 2015-10-07 NOTE — MAU Note (Signed)
Patient given crackers and Sprite for po challenge.

## 2015-10-24 ENCOUNTER — Encounter (HOSPITAL_COMMUNITY): Payer: Self-pay | Admitting: *Deleted

## 2015-10-24 ENCOUNTER — Observation Stay (HOSPITAL_COMMUNITY)
Admission: AD | Admit: 2015-10-24 | Discharge: 2015-10-25 | Disposition: A | Discharge status: Home / Self Care | Payer: BLUE CROSS/BLUE SHIELD | Source: Ambulatory Visit | Attending: Obstetrics and Gynecology | Admitting: Obstetrics and Gynecology

## 2015-10-24 DIAGNOSIS — Z3A30 30 weeks gestation of pregnancy: Secondary | ICD-10-CM

## 2015-10-24 DIAGNOSIS — O4100X0 Oligohydramnios, unspecified trimester, not applicable or unspecified: Secondary | ICD-10-CM

## 2015-10-24 DIAGNOSIS — Z87891 Personal history of nicotine dependence: Secondary | ICD-10-CM | POA: Insufficient documentation

## 2015-10-24 DIAGNOSIS — Z8659 Personal history of other mental and behavioral disorders: Secondary | ICD-10-CM | POA: Insufficient documentation

## 2015-10-24 DIAGNOSIS — O4103X0 Oligohydramnios, third trimester, not applicable or unspecified: Principal | ICD-10-CM

## 2015-10-24 DIAGNOSIS — N883 Incompetence of cervix uteri: Secondary | ICD-10-CM

## 2015-10-24 DIAGNOSIS — O26853 Spotting complicating pregnancy, third trimester: Secondary | ICD-10-CM | POA: Diagnosis present

## 2015-10-24 DIAGNOSIS — O26873 Cervical shortening, third trimester: Secondary | ICD-10-CM

## 2015-10-24 LAB — OB RESULTS CONSOLE GBS: GBS: NEGATIVE

## 2015-10-24 LAB — COMPREHENSIVE METABOLIC PANEL
ALT: 16 U/L (ref 14–54)
AST: 23 U/L (ref 15–41)
Albumin: 3.1 g/dL — ABNORMAL LOW (ref 3.5–5.0)
Alkaline Phosphatase: 83 U/L (ref 38–126)
Anion gap: 9 (ref 5–15)
BUN: 9 mg/dL (ref 6–20)
CO2: 21 mmol/L — ABNORMAL LOW (ref 22–32)
Calcium: 8.7 mg/dL — ABNORMAL LOW (ref 8.9–10.3)
Chloride: 107 mmol/L (ref 101–111)
Creatinine, Ser: 0.61 mg/dL (ref 0.44–1.00)
GFR calc Af Amer: >60 mL/min (ref >60)
GFR calc non Af Amer: >60 mL/min (ref >60)
Glucose, Bld: 89 mg/dL (ref 65–99)
Potassium: 4 mmol/L (ref 3.5–5.1)
Sodium: 137 mmol/L (ref 135–145)
Total Bilirubin: 0.6 mg/dL (ref 0.3–1.2)
Total Protein: 6.2 g/dL — ABNORMAL LOW (ref 6.5–8.1)

## 2015-10-24 LAB — CBC
HCT: 33.6 % — ABNORMAL LOW (ref 36.0–46.0)
Hemoglobin: 11.6 g/dL — ABNORMAL LOW (ref 12.0–15.0)
MCH: 31.3 pg (ref 26.0–34.0)
MCHC: 34.5 g/dL (ref 30.0–36.0)
MCV: 90.6 fL (ref 78.0–100.0)
Platelets: 268 10*3/uL (ref 150–400)
RBC: 3.71 MIL/uL — ABNORMAL LOW (ref 3.87–5.11)
RDW: 13.5 % (ref 11.5–15.5)
WBC: 7.3 10*3/uL (ref 4.0–10.5)

## 2015-10-24 LAB — TYPE AND SCREEN
ABO/RH(D): O POS
Antibody Screen: NEGATIVE

## 2015-10-24 MED ORDER — BETAMETHASONE SOD PHOS & ACET 6 (3-3) MG/ML IJ SUSP
12.0000 mg | INTRAMUSCULAR | Status: AC
  Administered 2015-10-24 – 2015-10-25 (×2): 12 mg via INTRAMUSCULAR
  Filled 2015-10-24 (×2): qty 2

## 2015-10-24 MED ORDER — DEXTROSE IN LACTATED RINGERS 5 % IV SOLN
INTRAVENOUS | Status: DC
  Administered 2015-10-24 – 2015-10-25 (×3): via INTRAVENOUS

## 2015-10-24 MED ORDER — ACETAMINOPHEN 325 MG PO TABS: 650.0000 mg | ORAL_TABLET | ORAL | Status: DC | PRN

## 2015-10-24 MED ORDER — CALCIUM CARBONATE ANTACID 500 MG PO CHEW: 2.0000 | CHEWABLE_TABLET | ORAL | Status: DC | PRN

## 2015-10-24 MED ORDER — PRENATAL MULTIVITAMIN CH
1.0000 | ORAL_TABLET | Freq: Every day | ORAL | Status: DC
  Administered 2015-10-25: 1 via ORAL
  Filled 2015-10-24: qty 1

## 2015-10-24 MED ORDER — ZOLPIDEM TARTRATE 5 MG PO TABS: 5.0000 mg | ORAL_TABLET | Freq: Every evening | ORAL | Status: DC | PRN

## 2015-10-24 MED ORDER — DOCUSATE SODIUM 100 MG PO CAPS
100.0000 mg | ORAL_CAPSULE | Freq: Every day | ORAL | Status: DC
  Administered 2015-10-24 – 2015-10-25 (×2): 100 mg via ORAL
  Filled 2015-10-24 (×2): qty 1

## 2015-10-24 NOTE — H&P (Signed)
[name] [name] is a 25 y.o. female presenting for admission due to abnl ultrasound in office.  [name] [name] has had an uncomplicated pregnancy ( and a previous term uncomplicated pregnancy).  She presented to Dr Marcelle Overlie today for eval of spotting but had Intercourse 2 days prior.  Ultrasound showed cervical funneling to length of 1.8cm and an AFI of 8.6cm (4%).  She was sent for further eval of PTL and oligohydraminos.  History OB History    Gravida Para Term Preterm AB TAB SAB Ectopic Multiple Living   0 1     Past Medical History  Diagnosis Date  . Anxiety   . ADHD (attention deficit hyperactivity disorder)   . Panic attacks   . Vaginal Pap smear, abnormal    Past Surgical History  Procedure Laterality Date  . Wisdom teeth removal Bilateral     2010  . Induced abortion     Family History: family history includes Cancer in her father; Heart disease in her father and paternal grandfather; Hyperlipidemia in her father; Hypertension in her father and mother. Social History:  reports that she quit smoking about 2 years ago. Her smoking use included Cigarettes. She has never used smokeless tobacco. She reports that she drinks about 1.2 oz of alcohol per week. She reports that she does not use illicit drugs.   Prenatal Transfer Tool  Maternal Diabetes: No Genetic Screening: Normal Maternal Ultrasounds/Referrals: Normal Fetal Ultrasounds or other Referrals:  None Maternal Substance Abuse:  No Significant Maternal Medications:  None Significant Maternal Lab Results:  None Other Comments:  None  ROS    Blood pressure 121/72, pulse 81, temperature 97.9 F (36.6 C), temperature source Oral, resp. rate 18, height  (1.702 m), weight 167 lb (75.751 kg), last menstrual period 03/19/2015, not currently breastfeeding. Exam Physical Exam  Prenatal labs: ABO, Rh: --/--/O POS (02/08 1235) Antibody: NEG (02/08 1235) Rubella:   RPR:    HBsAg:    HIV:    GBS:      Assessment/Plan: IUP at  29 6/7 Oligohydraminos - IVF hydration and MFM [place] and consult tomorrow.  No sxs of rom Shortened Cx Length - EFM for ctxs.  BMZ series.  No evidence of PTL thus far (and no history in prev pregnancy)   Tempie Gibeault C 10/24/2015, 6:32 PM

## 2015-10-25 ENCOUNTER — Ambulatory Visit (HOSPITAL_COMMUNITY): Payer: BLUE CROSS/BLUE SHIELD

## 2015-10-25 ENCOUNTER — Encounter (HOSPITAL_COMMUNITY): Payer: Self-pay | Admitting: *Deleted

## 2015-10-25 DIAGNOSIS — O4103X0 Oligohydramnios, third trimester, not applicable or unspecified: Secondary | ICD-10-CM | POA: Diagnosis not present

## 2015-10-25 NOTE — Discharge Instructions (Signed)
Fetal Movement Counts  Patient Name: __________________________________________________ Patient Due Date: ____________________  Performing a fetal movement count is highly recommended in high-risk pregnancies, but it is good for every pregnant woman to do. Your health care provider may ask you to start counting fetal movements at 28 weeks of the pregnancy. Fetal movements often increase:  · After eating a full meal.  · After physical activity.  · After eating or drinking something sweet or cold.  · At rest.  Pay attention to when you feel the baby is most active. This will help you notice a pattern of your baby's sleep and wake cycles and what factors contribute to an increase in fetal movement. It is important to perform a fetal movement count at the same time each day when your baby is normally most active.   HOW TO COUNT FETAL MOVEMENTS  1. Find a quiet and comfortable area to sit or lie down on your left side. Lying on your left side provides the best blood and oxygen circulation to your baby.  2. Write down the day and time on a sheet of paper or in a journal.  3. Start counting kicks, flutters, swishes, rolls, or jabs in a 2-hour period. You should feel at least 10 movements within 2 hours.  4. If you do not feel 10 movements in 2 hours, wait 2-3 hours and count again. Look for a change in the pattern or not enough counts in 2 hours.  SEEK MEDICAL CARE IF:  · You feel less than 10 counts in 2 hours, tried twice.  · There is no movement in over an hour.  · The pattern is changing or taking longer each day to reach 10 counts in 2 hours.  · You feel the baby is not moving as he or she usually does.  Date: ____________ Movements: ____________ Start time: ____________ Finish time: ____________   Date: ____________ Movements: ____________ Start time: ____________ Finish time: ____________  Date: ____________ Movements: ____________ Start time: ____________ Finish time: ____________  Date: ____________ Movements:  ____________ Start time: ____________ Finish time: ____________  Date: ____________ Movements: ____________ Start time: ____________ Finish time: ____________  Date: ____________ Movements: ____________ Start time: ____________ Finish time: ____________  Date: ____________ Movements: ____________ Start time: ____________ Finish time: ____________  Date: ____________ Movements: ____________ Start time: ____________ Finish time: ____________   Date: ____________ Movements: ____________ Start time: ____________ Finish time: ____________  Date: ____________ Movements: ____________ Start time: ____________ Finish time: ____________  Date: ____________ Movements: ____________ Start time: ____________ Finish time: ____________  Date: ____________ Movements: ____________ Start time: ____________ Finish time: ____________  Date: ____________ Movements: ____________ Start time: ____________ Finish time: ____________  Date: ____________ Movements: ____________ Start time: ____________ Finish time: ____________  Date: ____________ Movements: ____________ Start time: ____________ Finish time: ____________   Date: ____________ Movements: ____________ Start time: ____________ Finish time: ____________  Date: ____________ Movements: ____________ Start time: ____________ Finish time: ____________  Date: ____________ Movements: ____________ Start time: ____________ Finish time: ____________  Date: ____________ Movements: ____________ Start time: ____________ Finish time: ____________  Date: ____________ Movements: ____________ Start time: ____________ Finish time: ____________  Date: ____________ Movements: ____________ Start time: ____________ Finish time: ____________  Date: ____________ Movements: ____________ Start time: ____________ Finish time: ____________   Date: ____________ Movements: ____________ Start time: ____________ Finish time: ____________  Date: ____________ Movements: ____________ Start time: ____________ Finish  time: ____________  Date: ____________ Movements: ____________ Start time: ____________ Finish time: ____________  Date: ____________ Movements: ____________ Start time:   ____________ Finish time: ____________  Date: ____________ Movements: ____________ Start time: ____________ Finish time: ____________  Date: ____________ Movements: ____________ Start time: ____________ Finish time: ____________  Date: ____________ Movements: ____________ Start time: ____________ Finish time: ____________   Date: ____________ Movements: ____________ Start time: ____________ Finish time: ____________  Date: ____________ Movements: ____________ Start time: ____________ Finish time: ____________  Date: ____________ Movements: ____________ Start time: ____________ Finish time: ____________  Date: ____________ Movements: ____________ Start time: ____________ Finish time: ____________  Date: ____________ Movements: ____________ Start time: ____________ Finish time: ____________  Date: ____________ Movements: ____________ Start time: ____________ Finish time: ____________  Date: ____________ Movements: ____________ Start time: ____________ Finish time: ____________   Date: ____________ Movements: ____________ Start time: ____________ Finish time: ____________  Date: ____________ Movements: ____________ Start time: ____________ Finish time: ____________  Date: ____________ Movements: ____________ Start time: ____________ Finish time: ____________  Date: ____________ Movements: ____________ Start time: ____________ Finish time: ____________  Date: ____________ Movements: ____________ Start time: ____________ Finish time: ____________  Date: ____________ Movements: ____________ Start time: ____________ Finish time: ____________  Date: ____________ Movements: ____________ Start time: ____________ Finish time: ____________   Date: ____________ Movements: ____________ Start time: ____________ Finish time: ____________  Date: ____________  Movements: ____________ Start time: ____________ Finish time: ____________  Date: ____________ Movements: ____________ Start time: ____________ Finish time: ____________  Date: ____________ Movements: ____________ Start time: ____________ Finish time: ____________  Date: ____________ Movements: ____________ Start time: ____________ Finish time: ____________  Date: ____________ Movements: ____________ Start time: ____________ Finish time: ____________  Date: ____________ Movements: ____________ Start time: ____________ Finish time: ____________   Date: ____________ Movements: ____________ Start time: ____________ Finish time: ____________  Date: ____________ Movements: ____________ Start time: ____________ Finish time: ____________  Date: ____________ Movements: ____________ Start time: ____________ Finish time: ____________  Date: ____________ Movements: ____________ Start time: ____________ Finish time: ____________  Date: ____________ Movements: ____________ Start time: ____________ Finish time: ____________  Date: ____________ Movements: ____________ Start time: ____________ Finish time: ____________     This information is not intended to replace advice given to you by your health care provider. Make sure you discuss any questions you have with your health care provider.     Document Released: 10/01/2006 Document Revised: 09/22/2014 Document Reviewed: 06/28/2012  [employer] Interactive Patient Education ©2016 [employer]

## 2015-10-25 NOTE — Discharge Summary (Signed)
Admission Diagnosis: IUP at 29 w 6 days Short Cervix Oligohydramnios  Discharge Diagnosis: Same  Hospital Course: 25 year old female at 49 w 6 days admitted with vaginal spotting. Ultrasound on admission AFI 8 and Cervical length 1.8. She was admitted and given steroid series.  At 30 weeks, MFM Ultrasound revealed Cervix 2 + cm and AFI the same and fetal urinary tract WNL. Dr Sherrie George recommended outpatient management with bedrest and nightly Prometrium. She will follow up in office next week for AFI and OV

## 2015-10-25 NOTE — Progress Notes (Signed)
Patient is doing well. Denies contractions.  Reports good fetal movement.  BP 120/57 mmHg  Pulse 73  Temp(Src) 98.1 F (36.7 C) (Oral)  Resp 20  Ht  (1.702 m)  Wt 167 lb (75.751 kg)  BMI 26.15 kg/m2  SpO2 99%  LMP 03/19/2015 (Exact Date) Results for orders placed or performed during the hospital encounter of 10/24/15 (from the past 24 hour(s))  Type and screen [facility]     Status: None   Collection Time: 10/24/15 12:35 PM  Result Value Ref Range   ABO/RH(D) O POS    Antibody Screen NEG    Sample Expiration 10/27/2015   Comprehensive metabolic panel     Status: Abnormal   Collection Time: 10/24/15 12:35 PM  Result Value Ref Range   Sodium 137 135 - 145 mmol/L   Potassium 4.0 3.5 - 5.1 mmol/L   Chloride 107 101 - 111 mmol/L   CO2 21 (L) 22 - 32 mmol/L   Glucose, Bld 89 65 - 99 mg/dL   BUN 9 6 - 20 mg/dL   Creatinine, Ser 5.78 0.44 - 1.00 mg/dL   Calcium 8.7 (L) 8.9 - 10.3 mg/dL   Total Protein 6.2 (L) 6.5 - 8.1 g/dL   Albumin 3.1 (L) 3.5 - 5.0 g/dL   AST 23 15 - 41 U/L   ALT 16 14 - 54 U/L   Alkaline Phosphatase 83 38 - 126 U/L   Total Bilirubin 0.6 0.3 - 1.2 mg/dL   GFR calc non Af Amer >60 >60 mL/min   GFR calc Af Amer >60 >60 mL/min   Anion gap 9 5 - 15  CBC     Status: Abnormal   Collection Time: 10/24/15 12:35 PM  Result Value Ref Range   WBC 7.3 4.0 - 10.5 K/uL   RBC 3.71 (L) 3.87 - 5.11 MIL/uL   Hemoglobin 11.6 (L) 12.0 - 15.0 g/dL   HCT 46.9 (L) 62.9 - 52.8 %   MCV 90.6 78.0 - 100.0 fL   MCH 31.3 26.0 - 34.0 pg   MCHC 34.5 30.0 - 36.0 g/dL   RDW 41.3 24.4 - 01.0 %   Platelets 268 150 - 400 K/uL   Abdomen is soft and non tender  IMPRESSION: IUP at 30 weeks Shortened cervix Borderline low amniotic fluid  PLAN: Complete steroid series Ultrasound today if cervix and AFI are improved consider discharge home

## 2015-10-26 LAB — CULTURE, BETA STREP (GROUP B ONLY)

## 2015-10-29 ENCOUNTER — Inpatient Hospital Stay (HOSPITAL_COMMUNITY)
Admission: AD | Admit: 2015-10-29 | Discharge: 2015-10-29 | Disposition: A | Discharge status: Home / Self Care | Payer: BLUE CROSS/BLUE SHIELD | Source: Ambulatory Visit | Attending: Obstetrics and Gynecology | Admitting: Obstetrics and Gynecology

## 2015-10-29 ENCOUNTER — Encounter (HOSPITAL_COMMUNITY): Payer: Self-pay | Admitting: *Deleted

## 2015-10-29 DIAGNOSIS — O99343 Other mental disorders complicating pregnancy, third trimester: Secondary | ICD-10-CM | POA: Insufficient documentation

## 2015-10-29 DIAGNOSIS — O4703 False labor before 37 completed weeks of gestation, third trimester: Secondary | ICD-10-CM | POA: Diagnosis not present

## 2015-10-29 DIAGNOSIS — F909 Attention-deficit hyperactivity disorder, unspecified type: Secondary | ICD-10-CM | POA: Insufficient documentation

## 2015-10-29 DIAGNOSIS — R109 Unspecified abdominal pain: Secondary | ICD-10-CM | POA: Diagnosis present

## 2015-10-29 DIAGNOSIS — F419 Anxiety disorder, unspecified: Secondary | ICD-10-CM | POA: Diagnosis not present

## 2015-10-29 DIAGNOSIS — Z3A30 30 weeks gestation of pregnancy: Secondary | ICD-10-CM | POA: Diagnosis not present

## 2015-10-29 DIAGNOSIS — Z87891 Personal history of nicotine dependence: Secondary | ICD-10-CM | POA: Diagnosis not present

## 2015-10-29 LAB — URINALYSIS, ROUTINE W REFLEX MICROSCOPIC
Bilirubin Urine: NEGATIVE
Glucose, UA: NEGATIVE mg/dL
Hgb urine dipstick: NEGATIVE
Ketones, ur: NEGATIVE mg/dL
Nitrite: NEGATIVE
Protein, ur: NEGATIVE mg/dL
Specific Gravity, Urine: 1.01 (ref 1.005–1.030)
pH: 6.5 (ref 5.0–8.0)

## 2015-10-29 LAB — URINE MICROSCOPIC-ADD ON: RBC / HPF: NONE SEEN RBC/hpf (ref 0–5)

## 2015-10-29 NOTE — Discharge Instructions (Signed)

## 2015-10-29 NOTE — MAU Provider Note (Signed)
History     CSN: [id number]  Arrival date and time: 10/29/15 1205   First Provider Initiated Contact with Patient 10/29/15 1239       Chief Complaint  Patient presents with  . Abdominal Cramping   HPI Comments: [name] [name] is a 25 y.o. G3P1011 at [redacted]w[redacted]d who presents for abdominal cramping. Was admitted last week for cervical shortening to received steroids. Was discharged home on prometrium and pelvic rest. Reports abdominal cramping since last night. Called office this morning & was told to come here for assessment.   Abdominal Cramping The current episode started yesterday. The problem occurs intermittently. The problem has been gradually improving. The pain is located in the generalized abdominal region. The patient is experiencing no pain (no pain at this time). The quality of the pain is cramping. The abdominal pain does not radiate. Pertinent negatives include no constipation, diarrhea, dysuria, fever, nausea or vomiting. Nothing aggravates the pain. She has tried nothing for the symptoms.     OB History    Gravida Para Term Preterm AB TAB SAB Ectopic Multiple Living   0 1      Past Medical History  Diagnosis Date  . Anxiety   . ADHD (attention deficit hyperactivity disorder)   . Panic attacks   . Vaginal Pap smear, abnormal     Past Surgical History  Procedure Laterality Date  . Wisdom teeth removal Bilateral     2010  . Induced abortion      Family History  Problem Relation Age of Onset  . Hypertension Mother   . Heart disease Father   . Cancer Father     testicular  . Hypertension Father   . Hyperlipidemia Father   . Heart disease Paternal Grandfather     Social History  Substance Use Topics  . Smoking status: Former Smoker    Types: Cigarettes    Quit date: 08/15/2013  . Smokeless tobacco: Never Used  . Alcohol Use: No     Comment: socially, not with preg    Allergies:  Allergies  Allergen Reactions  . Ceclor [Cefaclor] Hives     No prescriptions prior to admission    Review of Systems  Constitutional: Negative.  Negative for fever.  Gastrointestinal: Positive for abdominal pain. Negative for nausea, vomiting, diarrhea and constipation.  Genitourinary: Negative.  Negative for dysuria.   Physical Exam   Blood pressure 118/72, pulse 120, temperature 97.8 F (36.6 C), temperature source Oral, resp. rate 18, height 5' 6.93" (1.7 m), weight 172 lb 9.6 oz (78.291 kg), last menstrual period 03/29/2015, SpO2 100 %, not currently breastfeeding.  Physical Exam  Nursing note and vitals reviewed. Constitutional: She is oriented to person, place, and time. She appears well-developed and well-nourished. No distress.  HENT:  Head: Normocephalic and atraumatic.  Eyes: Conjunctivae are normal. Right eye exhibits no discharge. Left eye exhibits no discharge. No scleral icterus.  Neck: Normal range of motion.  Cardiovascular: Normal rate, regular rhythm and normal heart sounds.   No murmur heard. Respiratory: Effort normal and breath sounds normal. No respiratory distress. She has no wheezes.  GI: Soft. Bowel sounds are normal. There is no tenderness.  Neurological: She is alert and oriented to person, place, and time.  Skin: Skin is warm and dry. She is not diaphoretic.  Psychiatric: She has a normal mood and affect. Her behavior is normal. Judgment and thought content normal.   Dilation: 1 Effacement (%): 70  Cervical Position: Posterior Station: -3 Presentation: Vertex Exam by:: Estanislado Spire, NP   Fetal Tracing:  Baseline: 135 Variability: moderate Accelerations: 10x10 Decelerations: none  Toco: x 1   MAU Course  Procedures Results for orders placed or performed during the hospital encounter of 10/29/15 (from the past 24 hour(s))  Urinalysis, Routine w reflex microscopic (not at [facility])     Status: Abnormal   Collection Time: 10/29/15 12:15 PM  Result Value Ref Range   Color, Urine YELLOW YELLOW    APPearance HAZY (A) CLEAR   Specific Gravity, Urine 1.010 1.005 - 1.030   pH 6.5 5.0 - 8.0   Glucose, UA NEGATIVE NEGATIVE mg/dL   Hgb urine dipstick NEGATIVE NEGATIVE   Bilirubin Urine NEGATIVE NEGATIVE   Ketones, ur NEGATIVE NEGATIVE mg/dL   Protein, ur NEGATIVE NEGATIVE mg/dL   Nitrite NEGATIVE NEGATIVE   Leukocytes, UA SMALL (A) NEGATIVE  Urine microscopic-add on     Status: Abnormal   Collection Time: 10/29/15 12:15 PM  Result Value Ref Range   Squamous Epithelial / LPF 6-30 (A) NONE SEEN   WBC, UA 0-5 0 - 5 WBC/hpf   RBC / HPF NONE SEEN 0 - 5 RBC/hpf   Bacteria, UA FEW (A) NONE SEEN    MDM Category 1 tracing 1 contraction observed during 1 hour of monitoring S/w Dr. Henderson Cloud. Discussed FHT & SVE. Ok to discharge home. Bed rest until f/u appt tomorrow morning in office.   Assessment and Plan  A: Preterm contractions  P: Discharge home Continue prometrium Bed rest until f/u appt Discussed reasons to return  Judeth Horn 10/29/2015, 12:39 PM

## 2015-10-29 NOTE — MAU Note (Signed)
Sent from dr's office, c/o cramping, "doesn't really feel like contractions."  Was admitted last wk, low fluid, funneling of cervix.

## 2015-12-14 ENCOUNTER — Inpatient Hospital Stay (HOSPITAL_COMMUNITY)
Admission: AD | Admit: 2015-12-14 | Discharge: 2015-12-14 | Disposition: A | Discharge status: Home / Self Care | Payer: BLUE CROSS/BLUE SHIELD | Source: Ambulatory Visit | Attending: Obstetrics and Gynecology | Admitting: Obstetrics and Gynecology

## 2015-12-14 ENCOUNTER — Encounter (HOSPITAL_COMMUNITY): Payer: Self-pay | Admitting: *Deleted

## 2015-12-14 DIAGNOSIS — Z3493 Encounter for supervision of normal pregnancy, unspecified, third trimester: Secondary | ICD-10-CM | POA: Diagnosis present

## 2015-12-14 NOTE — MAU Note (Signed)
Contractions and cramping, consistent since 1400. Thinks she may be leaking. No fluid seen.

## 2015-12-14 NOTE — Discharge Instructions (Signed)
Keep your scheduled appointment for prenatal care. Call the office or provider on call with further concerns. °

## 2015-12-21 ENCOUNTER — Telehealth (HOSPITAL_COMMUNITY): Payer: Self-pay | Admitting: *Deleted

## 2015-12-21 NOTE — Telephone Encounter (Signed)
Preadmission screen  

## 2015-12-26 NOTE — H&P (Signed)
[name] CoyerSophia M [name]  DICTATION # [id number] CSN# [id number]   [name],[name], MD 12/26/2015 10:52 AM

## 2015-12-27 ENCOUNTER — Inpatient Hospital Stay (HOSPITAL_COMMUNITY): Payer: BLUE CROSS/BLUE SHIELD | Admitting: Anesthesiology

## 2015-12-27 ENCOUNTER — Inpatient Hospital Stay (HOSPITAL_COMMUNITY)
Admission: RE | Admit: 2015-12-27 | Discharge: 2015-12-29 | DRG: 775 | Disposition: A | Discharge status: Home / Self Care | Payer: BLUE CROSS/BLUE SHIELD | Source: Ambulatory Visit | Attending: Obstetrics and Gynecology | Admitting: Obstetrics and Gynecology

## 2015-12-27 ENCOUNTER — Encounter (HOSPITAL_COMMUNITY): Payer: Self-pay

## 2015-12-27 DIAGNOSIS — Z3A39 39 weeks gestation of pregnancy: Secondary | ICD-10-CM

## 2015-12-27 DIAGNOSIS — O133 Gestational [pregnancy-induced] hypertension without significant proteinuria, third trimester: Secondary | ICD-10-CM

## 2015-12-27 DIAGNOSIS — F902 Attention-deficit hyperactivity disorder, combined type: Secondary | ICD-10-CM

## 2015-12-27 DIAGNOSIS — F411 Generalized anxiety disorder: Secondary | ICD-10-CM

## 2015-12-27 DIAGNOSIS — Z23 Encounter for immunization: Secondary | ICD-10-CM | POA: Diagnosis not present

## 2015-12-27 DIAGNOSIS — Z87891 Personal history of nicotine dependence: Secondary | ICD-10-CM

## 2015-12-27 LAB — TYPE AND SCREEN
ABO/RH(D): O POS
Antibody Screen: NEGATIVE

## 2015-12-27 LAB — CBC
HCT: 37.7 % (ref 36.0–46.0)
Hemoglobin: 12.9 g/dL (ref 12.0–15.0)
MCH: 31.3 pg (ref 26.0–34.0)
MCHC: 34.2 g/dL (ref 30.0–36.0)
MCV: 91.5 fL (ref 78.0–100.0)
Platelets: 251 10*3/uL (ref 150–400)
RBC: 4.12 MIL/uL (ref 3.87–5.11)
RDW: 13.7 % (ref 11.5–15.5)
WBC: 9.9 10*3/uL (ref 4.0–10.5)

## 2015-12-27 LAB — RPR: RPR Ser Ql: NONREACTIVE

## 2015-12-27 MED ORDER — IBUPROFEN 800 MG PO TABS
800.0000 mg | ORAL_TABLET | Freq: Three times a day (TID) | ORAL | Status: DC | PRN
  Administered 2015-12-27 – 2015-12-29 (×5): 800 mg via ORAL
  Filled 2015-12-27 (×5): qty 1

## 2015-12-27 MED ORDER — ONDANSETRON HCL 4 MG/2ML IJ SOLN
4.0000 mg | Freq: Four times a day (QID) | INTRAMUSCULAR | Status: DC | PRN
  Administered 2015-12-27: 4 mg via INTRAVENOUS
  Filled 2015-12-27: qty 2

## 2015-12-27 MED ORDER — FENTANYL 2.5 MCG/ML BUPIVACAINE 1/10 % EPIDURAL INFUSION (WH - ANES)
14.0000 mL/h | INTRAMUSCULAR | Status: DC | PRN
  Administered 2015-12-27: 14 mL/h via EPIDURAL
  Filled 2015-12-27: qty 125

## 2015-12-27 MED ORDER — ONDANSETRON HCL 4 MG PO TABS: 4.0000 mg | ORAL_TABLET | ORAL | Status: DC | PRN

## 2015-12-27 MED ORDER — LACTATED RINGERS IV SOLN
500.0000 mL | Freq: Once | INTRAVENOUS | Status: AC
  Administered 2015-12-27: 500 mL via INTRAVENOUS

## 2015-12-27 MED ORDER — EPHEDRINE 5 MG/ML INJ
10.0000 mg | INTRAVENOUS | Status: DC | PRN
  Filled 2015-12-27: qty 2

## 2015-12-27 MED ORDER — PHENYLEPHRINE 40 MCG/ML (10ML) SYRINGE FOR IV PUSH (FOR BLOOD PRESSURE SUPPORT)
80.0000 ug | PREFILLED_SYRINGE | INTRAVENOUS | Status: DC | PRN
  Filled 2015-12-27: qty 2
  Filled 2015-12-27: qty 20

## 2015-12-27 MED ORDER — ZOLPIDEM TARTRATE 5 MG PO TABS: 5.0000 mg | ORAL_TABLET | Freq: Every evening | ORAL | Status: DC | PRN

## 2015-12-27 MED ORDER — OXYTOCIN 10 UNIT/ML IJ SOLN
1.0000 m[IU]/min | INTRAVENOUS | Status: DC
  Administered 2015-12-27: 2 m[IU]/min via INTRAVENOUS
  Filled 2015-12-27: qty 4

## 2015-12-27 MED ORDER — TERBUTALINE SULFATE 1 MG/ML IJ SOLN
0.2500 mg | Freq: Once | INTRAMUSCULAR | Status: DC | PRN
  Filled 2015-12-27: qty 1

## 2015-12-27 MED ORDER — MEASLES, MUMPS & RUBELLA VAC ~~LOC~~ INJ: 0.5000 mL | INJECTION | Freq: Once | SUBCUTANEOUS | Status: DC

## 2015-12-27 MED ORDER — BISACODYL 10 MG RE SUPP
10.0000 mg | Freq: Every day | RECTAL | Status: DC | PRN
Start: 2015-12-27 — End: 2015-12-29

## 2015-12-27 MED ORDER — DIPHENHYDRAMINE HCL 25 MG PO CAPS
25.0000 mg | ORAL_CAPSULE | Freq: Four times a day (QID) | ORAL | Status: DC | PRN
  Administered 2015-12-27: 25 mg via ORAL
  Filled 2015-12-27: qty 1

## 2015-12-27 MED ORDER — LIDOCAINE HCL (PF) 1 % IJ SOLN
INTRAMUSCULAR | Status: DC | PRN
  Administered 2015-12-27: 6 mL via EPIDURAL
  Administered 2015-12-27: 4 mL

## 2015-12-27 MED ORDER — SENNOSIDES-DOCUSATE SODIUM 8.6-50 MG PO TABS
2.0000 | ORAL_TABLET | ORAL | Status: DC
  Administered 2015-12-28 (×2): 2 via ORAL
  Filled 2015-12-27 (×2): qty 2

## 2015-12-27 MED ORDER — OXYCODONE HCL 5 MG PO TABS
5.0000 mg | ORAL_TABLET | ORAL | Status: DC | PRN
  Administered 2015-12-27: 5 mg via ORAL
  Filled 2015-12-27 (×3): qty 1

## 2015-12-27 MED ORDER — TETANUS-DIPHTH-ACELL PERTUSSIS 5-2.5-18.5 LF-MCG/0.5 IM SUSP: 0.5000 mL | Freq: Once | INTRAMUSCULAR | Status: DC

## 2015-12-27 MED ORDER — OXYCODONE HCL 5 MG PO TABS
10.0000 mg | ORAL_TABLET | ORAL | Status: DC | PRN
  Administered 2015-12-28 – 2015-12-29 (×7): 10 mg via ORAL
  Filled 2015-12-27 (×6): qty 2

## 2015-12-27 MED ORDER — COCONUT OIL OIL
1.0000 "application " | TOPICAL_OIL | Status: DC | PRN
  Administered 2015-12-29: 1 via TOPICAL
  Filled 2015-12-27: qty 120

## 2015-12-27 MED ORDER — BENZOCAINE-MENTHOL 20-0.5 % EX AERO
1.0000 "application " | INHALATION_SPRAY | CUTANEOUS | Status: DC | PRN
  Administered 2015-12-27: 1 via TOPICAL
  Filled 2015-12-27: qty 56

## 2015-12-27 MED ORDER — FLEET ENEMA 7-19 GM/118ML RE ENEM: 1.0000 | ENEMA | Freq: Every day | RECTAL | Status: DC | PRN

## 2015-12-27 MED ORDER — LIDOCAINE HCL (PF) 1 % IJ SOLN
30.0000 mL | INTRAMUSCULAR | Status: DC | PRN
  Filled 2015-12-27: qty 30

## 2015-12-27 MED ORDER — ONDANSETRON HCL 4 MG/2ML IJ SOLN: 4.0000 mg | INTRAMUSCULAR | Status: DC | PRN

## 2015-12-27 MED ORDER — OXYTOCIN 10 UNIT/ML IJ SOLN: 2.5000 [IU]/h | INTRAMUSCULAR | Status: DC

## 2015-12-27 MED ORDER — LACTATED RINGERS IV SOLN
INTRAVENOUS | Status: DC
  Administered 2015-12-27 (×2): via INTRAVENOUS

## 2015-12-27 MED ORDER — PRENATAL MULTIVITAMIN CH
1.0000 | ORAL_TABLET | Freq: Every day | ORAL | Status: DC
  Administered 2015-12-28: 1 via ORAL
  Filled 2015-12-27: qty 1

## 2015-12-27 MED ORDER — WITCH HAZEL-GLYCERIN EX PADS: 1.0000 "application " | MEDICATED_PAD | CUTANEOUS | Status: DC | PRN

## 2015-12-27 MED ORDER — SIMETHICONE 80 MG PO CHEW: 80.0000 mg | CHEWABLE_TABLET | ORAL | Status: DC | PRN

## 2015-12-27 MED ORDER — ACETAMINOPHEN 325 MG PO TABS
650.0000 mg | ORAL_TABLET | ORAL | Status: DC | PRN
  Administered 2015-12-27 – 2015-12-29 (×5): 650 mg via ORAL
  Filled 2015-12-27 (×5): qty 2

## 2015-12-27 MED ORDER — ACETAMINOPHEN 325 MG PO TABS: 650.0000 mg | ORAL_TABLET | ORAL | Status: DC | PRN

## 2015-12-27 MED ORDER — OXYCODONE-ACETAMINOPHEN 5-325 MG PO TABS: 1.0000 | ORAL_TABLET | ORAL | Status: DC | PRN

## 2015-12-27 MED ORDER — DIPHENHYDRAMINE HCL 50 MG/ML IJ SOLN: 12.5000 mg | INTRAMUSCULAR | Status: DC | PRN

## 2015-12-27 MED ORDER — DIBUCAINE 1 % RE OINT: 1.0000 "application " | TOPICAL_OINTMENT | RECTAL | Status: DC | PRN

## 2015-12-27 MED ORDER — PHENYLEPHRINE 40 MCG/ML (10ML) SYRINGE FOR IV PUSH (FOR BLOOD PRESSURE SUPPORT)
80.0000 ug | PREFILLED_SYRINGE | INTRAVENOUS | Status: DC | PRN
  Filled 2015-12-27: qty 2

## 2015-12-27 MED ORDER — LACTATED RINGERS IV SOLN: 500.0000 mL | INTRAVENOUS | Status: DC | PRN

## 2015-12-27 MED ORDER — OXYTOCIN BOLUS FROM INFUSION
500.0000 mL | INTRAVENOUS | Status: DC
  Administered 2015-12-27: 500 mL via INTRAVENOUS

## 2015-12-27 MED ORDER — CITRIC ACID-SODIUM CITRATE 334-500 MG/5ML PO SOLN: 30.0000 mL | ORAL | Status: DC | PRN

## 2015-12-27 MED ORDER — OXYCODONE-ACETAMINOPHEN 5-325 MG PO TABS: 2.0000 | ORAL_TABLET | ORAL | Status: DC | PRN

## 2015-12-27 NOTE — Anesthesia Preprocedure Evaluation (Signed)

## 2015-12-27 NOTE — H&P (Signed)
[name] [name]:  [name], [name]               ACCOUNT NO.:  [id number]  MEDICAL RECORD NO.:  [id number]  LOCATION:                                 FACILITY:  PHYSICIAN:  Duke Salviaichard M. Marcelle [name], M.D.    DATE OF BIRTH:  DATE OF ADMISSION:  12/27/2015 DATE OF DISCHARGE:                             HISTORY & PHYSICAL   CHIEF COMPLAINT:  For labor induction at term, favorable cervix.  HISTORY OF PRESENT ILLNESS:  A 25 year old, G2, P1-0-0-1, EDD April 20, presents for labor induction at 39 weeks.  Last cervical exam was 2-3, 75%, vertex.  GBS was negative.  Her 1 hour GTT was 76.  Blood type is O positive.  PAST MEDICAL HISTORY:  One vaginal delivery in 2015, 7 pounds 13 ounce female.  ALLERGIES:  Ceclor.  Please see the [place] form for family and social history.  PHYSICAL EXAMINATION:  VITAL SIGNS:  Temperature 98.2 and blood pressure 122/80. HEENT:  Unremarkable. NECK:  Supple without masses. LUNGS:  Clear. CARDIOVASCULAR:  Regular rate and rhythm without murmurs, rubs, gallops. BREASTS:  Without masses. PELVIC:  Term fundal height.  Fetal heart rate 142.  Cervix 2-3, 75%, vertex, -1.  Membranes intact. EXTREMITIES:  Unremarkable. NEUROLOGIC:  Unremarkable.  IMPRESSION:  Term pregnancy, favorable cervix for AROM induction.     [name] [name], M.D.     RMH/MEDQ  D:  12/26/2015  T:  12/26/2015  Job:  [id number]

## 2015-12-27 NOTE — Lactation Note (Signed)
This note was copied from a baby's chart. Lactation Consultation Note  Patient Name: [name] Rosemarie BeathSophia Brandes [id number]'XToday's Date: 12/27/2015 Reason for consult: Initial assessment;Difficult latch Mom very anxious because baby is having trouble latching and she worried she is hungry. Mom's 1st baby would not latch, Mom tried to pump/bottle feed but had LMS. Mom's nipples are flat, dimpled in center. RN had initiated #20 nipple shield, LC assisted Mom with positioning to obtain depth with latch using nipple shield. Baby demonstrated some good suckling bursts, scant amount of colostrum present in nipple shield. Due to Mom's history with 1st child she is considering supplementing, she will advise. Tried to [employer]-assure Mom. Discussed normal newborn behaviors in the 1st 24 hours and encouraged to give baby some time to transition. Encouraged to BF with feeding ques, cluster feeding discussed.  Basic teaching reviewed. Lactation brochure left for review, advised of OP services and encouraged Mom to schedule OP f/u due to history and use of nipple shield, advised of Support group. RN setting up DEBP for Mom to start post pumping. Encouraged to call for assist.   Maternal Data Has patient been taught Hand Expression?: Yes Does the patient have breastfeeding experience prior to this delivery?: No (1st baby would not latch, Mom tried to pump/bottle but had LMS)  Feeding Feeding Type: Breast Fed Length of feed: 10 min  LATCH Score/Interventions Latch: Repeated attempts needed to sustain latch, nipple held in mouth throughout feeding, stimulation needed to elicit sucking reflex. Intervention(s): Adjust position;Assist with latch;Breast massage;Breast compression  Audible Swallowing: A few with stimulation  Type of Nipple: Flat Intervention(s): Double electric pump  Comfort (Breast/Nipple): Soft / non-tender     Hold (Positioning): Assistance needed to correctly position infant at breast and maintain  latch. Intervention(s): Breastfeeding basics reviewed;Support Pillows;Position options;Skin to skin  LATCH Score: 6  Lactation Tools Discussed/Used Tools: Nipple Dorris CarnesShields;Pump Nipple shield size: 20 Breast pump type: Double-Electric Breast Pump   Consult Status Consult Status: Follow-up Date: 12/28/15 Follow-up type: In-patient    Alfred LevinsGranger, Naveen Lorusso Ann 12/27/2015, 8:01 PM

## 2015-12-27 NOTE — Progress Notes (Signed)
3/75/vtx, AROM>>clr af

## 2015-12-27 NOTE — Anesthesia Procedure Notes (Signed)

## 2015-12-28 LAB — CBC
HCT: 35.1 % — ABNORMAL LOW (ref 36.0–46.0)
Hemoglobin: 11.7 g/dL — ABNORMAL LOW (ref 12.0–15.0)
MCH: 30.7 pg (ref 26.0–34.0)
MCHC: 33.3 g/dL (ref 30.0–36.0)
MCV: 92.1 fL (ref 78.0–100.0)
Platelets: 217 10*3/uL (ref 150–400)
RBC: 3.81 MIL/uL — ABNORMAL LOW (ref 3.87–5.11)
RDW: 13.6 % (ref 11.5–15.5)
WBC: 12.2 10*3/uL — ABNORMAL HIGH (ref 4.0–10.5)

## 2015-12-28 NOTE — Anesthesia Postprocedure Evaluation (Signed)
Anesthesia Post Note  Patient: [name] CoyerSophia M [name]  Procedure(s) Performed: * No procedures listed *  Patient location during evaluation: Mother Baby Anesthesia Type: Epidural Level of consciousness: awake and alert and oriented Pain management: satisfactory to patient Vital Signs Assessment: post-procedure vital signs reviewed and stable Respiratory status: spontaneous breathing and nonlabored ventilation Cardiovascular status: stable Postop Assessment: no headache, no backache, no signs of nausea or vomiting, adequate PO intake and patient able to bend at knees (patient up walking) Anesthetic complications: no    Last Vitals:  Filed Vitals:   12/27/15 2010 12/28/15 0345  BP: 128/60 109/60  Pulse: 70 70  Temp: 36.7 C 36.6 C  Resp: 18 18    Last Pain:  Filed Vitals:   12/28/15 0611  PainSc: 8                  Liliahna Cudd

## 2015-12-28 NOTE — Progress Notes (Signed)
MOB was referred for history of depression/anxiety. * Referral screened out by Clinical Social Worker because none of the following criteria appear to apply: ~ History of anxiety/depression during this pregnancy, or of post-partum depression. ~ Diagnosis of anxiety and/or depression within last 3 years OR * MOB's symptoms currently being treated with medication and/or therapy. Please contact the Clinical Social Worker if needs arise, or if MOB requests.   

## 2015-12-28 NOTE — Progress Notes (Signed)
Patient doing well. No complaints.  BP 109/60 mmHg  Pulse 70  Temp(Src) 97.8 F (36.6 C) (Oral)  Resp 18  Ht 5' 7.5" (1.715 m)  Wt 86.183 kg (190 lb)  BMI 29.30 kg/m2  SpO2 100%  LMP 03/29/2015  Breastfeeding? Unknown Abdomen is soft and non tender  Results for orders placed or performed during the hospital encounter of 12/27/15 (from the past 24 hour(s))  CBC     Status: Abnormal   Collection Time: 12/28/15  6:11 AM  Result Value Ref Range   WBC 12.2 (H) 4.0 - 10.5 K/uL   RBC 3.81 (L) 3.87 - 5.11 MIL/uL   Hemoglobin 11.7 (L) 12.0 - 15.0 g/dL   HCT 40.935.1 (L) 81.136.0 - 91.446.0 %   MCV 92.1 78.0 - 100.0 fL   MCH 30.7 26.0 - 34.0 pg   MCHC 33.3 30.0 - 36.0 g/dL   RDW 78.213.6 95.611.5 - 21.315.5 %   Platelets 217 150 - 400 K/uL   Impression: PPD #1 Doing well Routine care Discharge home tomorrow

## 2015-12-29 ENCOUNTER — Ambulatory Visit: Payer: Self-pay

## 2015-12-29 MED ORDER — IBUPROFEN 800 MG PO TABS: 800.0000 mg | ORAL_TABLET | Freq: Three times a day (TID) | ORAL | Status: DC | PRN

## 2015-12-29 MED ORDER — OXYCODONE HCL 5 MG PO TABS: 5.0000 mg | ORAL_TABLET | ORAL | Status: DC | PRN

## 2015-12-29 NOTE — Lactation Note (Signed)
This note was copied from a baby's chart. Lactation Consultation Note Mom has #20 NS, I checked to make sure still correct size, fitted appropriately. Mom using DEBP and formula feeding. Using NS and inserting formula in NS. Formula feeding also in bottles. Mom has flat nipples. Rt. Nipple cracked in center. Red irritated sore nipples bilaterally, comfort gels given for cracked nipple. Hand pump given to evert nipples prior to applying NS. Shells given to wear between feedings in bra To assist in everting nipples. Tried to make OP f/u d/t wearing NS and flat nipples. Mom stated she has a 315 month old at home and has a babysitter that it is Spring break and will need to call and schedule later when she can.' stress importance of making OP services since using NS and having cracked nipples. Discussed engorgement, mastitis prevention, pumping, I&), supply and demand.  Patient Name: [name] Rosemarie BeathSophia [name] ZOXWR'UToday's Date: 12/29/2015 Reason for consult: Follow-up assessment   Maternal Data    Feeding Feeding Type: Bottle Fed - Formula Nipple Type: Slow - flow  LATCH Score/Interventions Intervention(s): Breast massage;Breast compression     Type of Nipple: Flat Intervention(s): Shells;Hand pump;Double electric pump  Comfort (Breast/Nipple): Engorged, cracked, bleeding, large blisters, severe discomfort Problem noted: Cracked, bleeding, blisters, bruises Intervention(s): Hand pump;Expressed breast milk to nipple     Intervention(s): Position options;Support Pillows;Breastfeeding basics reviewed     Lactation Tools Discussed/Used Tools: Shells;Nipple Shields;Pump;Comfort gels Nipple shield size: 20 Breast pump type: Double-Electric Breast Pump Pump Review: Setup, frequency, and cleaning;Milk Storage Initiated by:: RN/L. Mack Alvidrez RN manual Date initiated:: 12/29/15   Consult Status Consult Status: Complete Date: 12/29/15    Charyl DancerCARVER, Mahogony Gilchrest G 12/29/2015, 11:16 AM

## 2015-12-29 NOTE — Discharge Summary (Signed)
Obstetric Discharge Summary Reason for Admission: induction of labor Prenatal Procedures: none Intrapartum Procedures: spontaneous vaginal delivery Postpartum Procedures: none Complications-Operative and Postpartum: first degree perineal laceration HEMOGLOBIN  Date Value Ref Range Status  12/28/2015 11.7* 12.0 - 15.0 g/dL Final  46/96/295209/21/2016 84.112.7 g/dL Final   HCT  Date Value Ref Range Status  12/28/2015 35.1* 36.0 - 46.0 % Final  06/06/2015 37 % Final    Physical Exam:  General: alert, cooperative and appears stated age 3Lochia: appropriate Uterine Fundus: firm Incision: healing well, no significant drainage, no dehiscence DVT Evaluation: No evidence of DVT seen on physical exam.  Discharge Diagnoses: Term Pregnancy-delivered  Discharge Information: Date: 12/29/2015 Activity: pelvic rest Diet: routine Medications: Ibuprofen and Percocet Condition: improved Instructions: refer to practice specific booklet Discharge to: home   Newborn Data: Live born female  Birth Weight: 6 lb 5.8 oz (2885 g) APGAR: 8, 9  Home with mother.  Jaylise Peek L 12/29/2015, 7:57 AM

## 2015-12-31 DIAGNOSIS — Z00110 Health examination for newborn under 8 days old: Secondary | ICD-10-CM | POA: Diagnosis not present

## 2016-01-04 ENCOUNTER — Encounter (HOSPITAL_COMMUNITY): Payer: Self-pay | Admitting: *Deleted

## 2016-01-04 ENCOUNTER — Inpatient Hospital Stay (HOSPITAL_COMMUNITY): Payer: BLUE CROSS/BLUE SHIELD | Admitting: Certified Registered Nurse Anesthetist

## 2016-01-04 ENCOUNTER — Encounter (HOSPITAL_COMMUNITY)
Admission: AD | Disposition: A | Discharge status: Home / Self Care | Payer: Self-pay | Source: Ambulatory Visit | Attending: Obstetrics and Gynecology

## 2016-01-04 ENCOUNTER — Ambulatory Visit (HOSPITAL_COMMUNITY)
Admission: AD | Admit: 2016-01-04 | Discharge: 2016-01-04 | Disposition: A | Discharge status: Home / Self Care | Payer: BLUE CROSS/BLUE SHIELD | Source: Ambulatory Visit | Attending: Obstetrics and Gynecology | Admitting: Obstetrics and Gynecology

## 2016-01-04 DIAGNOSIS — Z87891 Personal history of nicotine dependence: Secondary | ICD-10-CM | POA: Diagnosis not present

## 2016-01-04 DIAGNOSIS — O8612 Endometritis following delivery: Secondary | ICD-10-CM | POA: Diagnosis not present

## 2016-01-04 DIAGNOSIS — F419 Anxiety disorder, unspecified: Secondary | ICD-10-CM | POA: Insufficient documentation

## 2016-01-04 DIAGNOSIS — Z00110 Health examination for newborn under 8 days old: Secondary | ICD-10-CM | POA: Diagnosis not present

## 2016-01-04 HISTORY — PX: DILATION AND CURETTAGE OF UTERUS: SHX78

## 2016-01-04 HISTORY — PX: DILATION AND EVACUATION: SHX1459

## 2016-01-04 LAB — CBC WITH DIFFERENTIAL/PLATELET
Basophils Absolute: 0.1 10*3/uL (ref 0.0–0.1)
Basophils Relative: 1 %
Eosinophils Absolute: 0.1 10*3/uL (ref 0.0–0.7)
Eosinophils Relative: 2 %
HCT: 38.2 % (ref 36.0–46.0)
Hemoglobin: 13.1 g/dL (ref 12.0–15.0)
Lymphocytes Relative: 47 %
Lymphs Abs: 3.3 10*3/uL (ref 0.7–4.0)
MCH: 31 pg (ref 26.0–34.0)
MCHC: 34.3 g/dL (ref 30.0–36.0)
MCV: 90.5 fL (ref 78.0–100.0)
Monocytes Absolute: 0.4 10*3/uL (ref 0.1–1.0)
Monocytes Relative: 5 %
Neutro Abs: 3.2 10*3/uL (ref 1.7–7.7)
Neutrophils Relative %: 45 %
Platelets: 229 10*3/uL (ref 150–400)
RBC: 4.22 MIL/uL (ref 3.87–5.11)
RDW: 12.9 % (ref 11.5–15.5)
WBC: 7.1 10*3/uL (ref 4.0–10.5)

## 2016-01-04 SURGERY — DILATION AND EVACUATION, UTERUS
Anesthesia: Monitor Anesthesia Care | Site: Vagina

## 2016-01-04 MED ORDER — DEXAMETHASONE SODIUM PHOSPHATE 4 MG/ML IJ SOLN
INTRAMUSCULAR | Status: DC | PRN
  Administered 2016-01-04: 4 mg via INTRAVENOUS

## 2016-01-04 MED ORDER — FENTANYL CITRATE (PF) 100 MCG/2ML IJ SOLN
INTRAMUSCULAR | Status: DC | PRN
  Administered 2016-01-04: 50 ug via INTRAVENOUS

## 2016-01-04 MED ORDER — DEXAMETHASONE SODIUM PHOSPHATE 4 MG/ML IJ SOLN
INTRAMUSCULAR | Status: AC
  Filled 2016-01-04: qty 1

## 2016-01-04 MED ORDER — LACTATED RINGERS IV BOLUS (SEPSIS)
1000.0000 mL | Freq: Once | INTRAVENOUS | Status: AC
  Administered 2016-01-04: 500 mL via INTRAVENOUS
  Administered 2016-01-04: 13:00:00 via INTRAVENOUS
  Administered 2016-01-04: 1000 mL via INTRAVENOUS

## 2016-01-04 MED ORDER — DEXAMETHASONE SODIUM PHOSPHATE 10 MG/ML IJ SOLN
INTRAMUSCULAR | Status: AC
  Filled 2016-01-04: qty 1

## 2016-01-04 MED ORDER — FENTANYL CITRATE (PF) 100 MCG/2ML IJ SOLN
INTRAMUSCULAR | Status: AC
  Filled 2016-01-04: qty 2

## 2016-01-04 MED ORDER — KETOROLAC TROMETHAMINE 30 MG/ML IJ SOLN
INTRAMUSCULAR | Status: DC | PRN
  Administered 2016-01-04: 30 mg via INTRAVENOUS

## 2016-01-04 MED ORDER — MIDAZOLAM HCL 2 MG/2ML IJ SOLN
INTRAMUSCULAR | Status: DC | PRN
  Administered 2016-01-04: 2 mg via INTRAVENOUS

## 2016-01-04 MED ORDER — DEXTROSE 5 % IV SOLN
2.0000 g | Freq: Once | INTRAVENOUS | Status: AC
  Administered 2016-01-04: 2 g via INTRAVENOUS
  Filled 2016-01-04: qty 2

## 2016-01-04 MED ORDER — LIDOCAINE HCL (CARDIAC) 20 MG/ML IV SOLN
INTRAVENOUS | Status: AC
  Filled 2016-01-04: qty 5

## 2016-01-04 MED ORDER — PROPOFOL 500 MG/50ML IV EMUL
INTRAVENOUS | Status: DC | PRN
  Administered 2016-01-04: 100 ug/kg/min via INTRAVENOUS

## 2016-01-04 MED ORDER — FAMOTIDINE IN NACL 20-0.9 MG/50ML-% IV SOLN
20.0000 mg | Freq: Once | INTRAVENOUS | Status: AC
  Administered 2016-01-04: 20 mg via INTRAVENOUS

## 2016-01-04 MED ORDER — PROPOFOL 10 MG/ML IV BOLUS
INTRAVENOUS | Status: DC | PRN
  Administered 2016-01-04 (×2): 20 mg via INTRAVENOUS

## 2016-01-04 MED ORDER — LIDOCAINE HCL (CARDIAC) 20 MG/ML IV SOLN
INTRAVENOUS | Status: DC | PRN
  Administered 2016-01-04: 80 mg via INTRAVENOUS

## 2016-01-04 MED ORDER — MIDAZOLAM HCL 2 MG/2ML IJ SOLN
INTRAMUSCULAR | Status: AC
  Filled 2016-01-04: qty 2

## 2016-01-04 MED ORDER — ONDANSETRON HCL 4 MG/2ML IJ SOLN
INTRAMUSCULAR | Status: AC
  Filled 2016-01-04: qty 2

## 2016-01-04 MED ORDER — FENTANYL CITRATE (PF) 100 MCG/2ML IJ SOLN
25.0000 ug | INTRAMUSCULAR | Status: DC | PRN
  Administered 2016-01-04 (×2): 50 ug via INTRAVENOUS
  Administered 2016-01-04: 25 ug via INTRAVENOUS

## 2016-01-04 MED ORDER — FAMOTIDINE IN NACL 20-0.9 MG/50ML-% IV SOLN
INTRAVENOUS | Status: AC
  Filled 2016-01-04: qty 50

## 2016-01-04 MED ORDER — AMOXICILLIN-POT CLAVULANATE 875-125 MG PO TABS: 1.0000 | ORAL_TABLET | Freq: Two times a day (BID) | ORAL | Status: DC

## 2016-01-04 MED ORDER — KETOROLAC TROMETHAMINE 30 MG/ML IJ SOLN
INTRAMUSCULAR | Status: AC
  Filled 2016-01-04: qty 1

## 2016-01-04 MED ORDER — ONDANSETRON HCL 4 MG/2ML IJ SOLN
INTRAMUSCULAR | Status: DC | PRN
  Administered 2016-01-04: 4 mg via INTRAVENOUS

## 2016-01-04 MED ORDER — PROPOFOL 10 MG/ML IV BOLUS
INTRAVENOUS | Status: AC
  Filled 2016-01-04: qty 40

## 2016-01-04 MED ORDER — LACTATED RINGERS IV SOLN
INTRAVENOUS | Status: DC | PRN
Start: 2016-01-04 — End: 2016-01-04
  Administered 2016-01-04 (×2): via INTRAVENOUS

## 2016-01-04 MED ORDER — ONDANSETRON HCL 4 MG/2ML IJ SOLN: 4.0000 mg | Freq: Once | INTRAMUSCULAR | Status: DC | PRN

## 2016-01-04 MED ORDER — FENTANYL CITRATE (PF) 100 MCG/2ML IJ SOLN
INTRAMUSCULAR | Status: AC
  Administered 2016-01-04: 25 ug via INTRAVENOUS
  Filled 2016-01-04: qty 2

## 2016-01-04 SURGICAL SUPPLY — 19 items
CATH ROBINSON RED A/P 16FR (CATHETERS) ×2 IMPLANT
CLOTH BEACON ORANGE TIMEOUT ST (SAFETY) ×2 IMPLANT
DECANTER SPIKE VIAL GLASS SM (MISCELLANEOUS) ×2 IMPLANT
GLOVE BIO SURGEON STRL SZ8 (GLOVE) ×2 IMPLANT
GLOVE BIOGEL PI IND STRL 7.0 (GLOVE) ×1 IMPLANT
GLOVE BIOGEL PI INDICATOR 7.0 (GLOVE) ×1
GLOVE SURG ORTHO 8.0 STRL STRW (GLOVE) ×2 IMPLANT
GOWN STRL REUS W/TWL LRG LVL3 (GOWN DISPOSABLE) ×4 IMPLANT
KIT BERKELEY 1ST TRIMESTER 3/8 (MISCELLANEOUS) ×2 IMPLANT
NS IRRIG 1000ML POUR BTL (IV SOLUTION) ×2 IMPLANT
PACK VAGINAL MINOR WOMEN LF (CUSTOM PROCEDURE TRAY) ×2 IMPLANT
PAD OB MATERNITY 4.3X12.25 (PERSONAL CARE ITEMS) ×2 IMPLANT
PAD PREP 24X48 CUFFED NSTRL (MISCELLANEOUS) ×2 IMPLANT
SET BERKELEY SUCTION TUBING (SUCTIONS) ×2 IMPLANT
TOWEL OR 17X24 6PK STRL BLUE (TOWEL DISPOSABLE) ×4 IMPLANT
VACURETTE 10 RIGID CVD (CANNULA) IMPLANT
VACURETTE 7MM CVD STRL WRAP (CANNULA) IMPLANT
VACURETTE 8 RIGID CVD (CANNULA) ×2 IMPLANT
VACURETTE 9 RIGID CVD (CANNULA) IMPLANT

## 2016-01-04 NOTE — Anesthesia Postprocedure Evaluation (Signed)
Anesthesia Post Note  Patient: [name] CoyerSophia M [name]  Procedure(s) Performed: Procedure(s) (LRB): DILATATION AND EVACUATION (N/A)  Patient location during evaluation: PACU Anesthesia Type: MAC Level of consciousness: awake and alert Pain management: pain level controlled Vital Signs Assessment: post-procedure vital signs reviewed and stable Respiratory status: spontaneous breathing, nonlabored ventilation, respiratory function stable and patient connected to nasal cannula oxygen Cardiovascular status: blood pressure returned to baseline and stable Postop Assessment: no signs of nausea or vomiting Anesthetic complications: no    Last Vitals:  Filed Vitals:   01/04/16 1530 01/04/16 1545  BP: 125/82 128/84  Pulse: 63 62  Temp:    Resp: 13 17    Last Pain:  Filed Vitals:   01/04/16 1550  PainSc: 8                  Maila Dukes JENNETTE

## 2016-01-04 NOTE — Anesthesia Preprocedure Evaluation (Signed)
Anesthesia Evaluation  Patient identified by MRN, date of birth, ID band Patient awake    Reviewed: Allergy & Precautions, NPO status , Patient's Chart, lab work & pertinent test results  History of Anesthesia Complications Negative for: history of anesthetic complications  Airway Mallampati: II  TM Distance: >3 FB Neck ROM: Full    Dental no notable dental hx. (+) Dental Advisory Given   Pulmonary former smoker,    Pulmonary exam normal breath sounds clear to auscultation       Cardiovascular hypertension (gestational), Normal cardiovascular exam Rhythm:Regular Rate:Normal     Neuro/Psych PSYCHIATRIC DISORDERS Anxiety negative neurological ROS     GI/Hepatic negative GI ROS, Neg liver ROS,   Endo/Other  negative endocrine ROS  Renal/GU negative Renal ROS  negative genitourinary   Musculoskeletal negative musculoskeletal ROS (+)   Abdominal   Peds negative pediatric ROS (+)  Hematology negative hematology ROS (+)   Anesthesia Other Findings   Reproductive/Obstetrics 8 days postpartum, retained products                             Anesthesia Physical Anesthesia Plan  ASA: II  Anesthesia Plan: MAC   Post-op Pain Management:    Induction: Intravenous  Airway Management Planned: Natural Airway and Nasal Cannula  Additional Equipment:   Intra-op Plan:   Post-operative Plan:   Informed Consent: I have reviewed the patients History and Physical, chart, labs and discussed the procedure including the risks, benefits and alternatives for the proposed anesthesia with the patient or authorized representative who has indicated his/her understanding and acceptance.   Dental advisory given  Plan Discussed with: CRNA  Anesthesia Plan Comments:         Anesthesia Quick Evaluation

## 2016-01-04 NOTE — Transfer of Care (Signed)
Immediate Anesthesia Transfer of Care Note  Patient: [name] [name]  Procedure(s) Performed: Procedure(s): DILATATION AND EVACUATION (N/A)  Patient Location: PACU  Anesthesia Type:MAC  Level of Consciousness: awake, alert  and oriented  Airway & Oxygen Therapy: Patient Spontanous Breathing and Patient connected to nasal cannula oxygen  Post-op Assessment: Report given to RN and Post -op Vital signs reviewed and stable  Post vital signs: Reviewed and stable  Last Vitals:  Filed Vitals:   01/04/16 1305  BP: 131/82  Pulse: 94  Temp: 36.7 C  Resp: 18    Complications: No apparent anesthesia complications

## 2016-01-04 NOTE — Brief Op Note (Signed)
01/04/2016  3:10 PM  PATIENT:  [name] [name]  25 y.o. female  PRE-OPERATIVE DIAGNOSIS:  retained products   POST-OPERATIVE DIAGNOSIS:  retained products   PROCEDURE:  Procedure(s): DILATATION AND EVACUATION (N/A)  SURGEON:  Surgeon(s) and Role:    * Candice Campavid Maclain Cohron, MD - Primary  PHYSICIAN ASSISTANT:   ASSISTANTS: none   ANESTHESIA:   MAC  EBL:     BLOOD ADMINISTERED:none  DRAINS: none   LOCAL MEDICATIONS USED:  NONE  SPECIMEN:  Source of Specimen:  POC  DISPOSITION OF SPECIMEN:  PATHOLOGY  COUNTS:  YES  TOURNIQUET:  * No tourniquets in log *  DICTATION: .Other Dictation: Dictation Number 1  PLAN OF CARE: Discharge to home after PACU  PATIENT DISPOSITION:  PACU - hemodynamically stable.   Delay start of Pharmacological VTE agent (>24hrs) due to surgical blood loss or risk of bleeding: not applicable

## 2016-01-04 NOTE — MAU Note (Signed)
[facility].  Ongoing bleeding, pain and waking up with chills/sweats. Vag Del 4/13. Baby is doing well.pt is pumping and bottle feeding.

## 2016-01-04 NOTE — H&P (Signed)
HPI: [name] [name] is a 25 y.o. [id number] who is about a week post vaginal delivery (12/27/15) presents to maternity admissions reporting chills, bleeding, and pain. She had an [place] in the office and it showed retained products of conception. She was sent here for a planned D&C by Dr Rana Snare. She reports vaginal bleeding, but no vaginal itching/burning, urinary symptoms, h/a, dizziness, n/v.   RN Note: Sent from office for [facility], has retained POC. Ongoing bleeding, pain and waking up with chills/sweats. Vag Del 4/13. Baby is doing well.pt is pumping and bottle feeding.            Past Medical History: Past Medical History  Diagnosis Date  . Anxiety   . ADHD (attention deficit hyperactivity disorder)   . Panic attacks   . Vaginal Pap smear, abnormal     Past obstetric history: OB History  Gravida Para Term Preterm AB SAB TAB Ectopic Multiple Living  0 2    # Outcome Date GA Lbr Len/2nd Weight Sex Delivery Anes PTL Lv  3 Term 12/27/15 [redacted]w[redacted]d 04:52 / 00:45 6 lb 5.8 oz (2.885 kg) F Vag-Spont EPI  Y  2 Term 09/07/14 [redacted]w[redacted]d 03:24 / 03:48 7 lb 13.8 oz (3.565 kg) F Vag-Spont EPI  Y  1 TAB               Past Surgical History: Past Surgical History  Procedure Laterality Date  . Wisdom teeth removal Bilateral     2010  . Induced abortion      Family History: Family History  Problem Relation Age of Onset  . Hypertension Mother   . Heart disease Father   . Cancer Father     testicular  . Hypertension Father   . Hyperlipidemia Father   . Heart disease Paternal Grandfather     Social History: Social History  Substance Use Topics  . Smoking status: Former Smoker    Types: Cigarettes    Quit date: 08/15/2013  . Smokeless tobacco: Never Used  . Alcohol Use: No     Comment: socially, not with preg     Allergies:  Allergies  Allergen Reactions  . Ceclor [Cefaclor] Hives    Age 73 mild rash    Meds:  Prescriptions prior to admission  Medication Sig Dispense Refill Last Dose  . ibuprofen (ADVIL,MOTRIN) 800 MG tablet Take 1 tablet (800 mg total) by mouth every 8 (eight) hours as needed for moderate pain. (Patient not taking: Reported on 01/04/2016) 30 tablet 0     I have reviewed patient's Past Medical Hx, Surgical Hx, Family Hx, Social Hx, medications and allergies.  ROS:  Review of Systems  Constitutional: Positive for fatigue. Negative for fever and chills.  Gastrointestinal: Positive for abdominal pain. Negative for nausea, vomiting, diarrhea and constipation.  Genitourinary: Positive for vaginal bleeding and pelvic pain. Negative for dysuria, flank pain and vaginal discharge.  Neurological: Negative for dizziness.   Other systems negative  Physical Exam  Patient Vitals for the past 24 hrs:  BP Temp Temp src Pulse Resp SpO2 Height Weight  01/04/16 1305 131/82 mmHg 98 F (36.7 C) Oral 94 18 97 % 5' 7.5" (1.715 m) 183 lb (83.008 kg)   Constitutional: Well-developed, well-nourished female in no acute distress.  Cardiovascular: normal rate and rhythm, no ectopy audible, S1 & S2 heard, no murmur Respiratory: normal effort, no distress. Lungs CTAB with no wheezes or crackles GI: Abd soft, non-tender. Nondistended. No  rebound, No guarding. Bowel Sounds audible  MS: Extremities nontender, no edema, normal ROM Neurologic: Alert and oriented x 4. Grossly nonfocal. GU: Neg CVAT. Skin: Warm and Dry Psych: Affect appropriate.  PELVIC EXAM: Deferred  No blood on pad  Labs:  Lab Results Last 24 Hours    Results for orders placed or performed during the hospital encounter of 01/04/16 (from the past 24 hour(s))  CBC with Differential/Platelet Status: None (Preliminary result)   Collection Time:  01/04/16 1:30 PM  Result Value Ref Range   WBC 7.1 4.0 - 10.5 K/uL   RBC 4.22 3.87 - 5.11 MIL/uL   Hemoglobin 13.1 12.0 - 15.0 g/dL   HCT 16.138.2 09.636.0 - 04.546.0 %   MCV 90.5 78.0 - 100.0 fL   MCH 31.0 26.0 - 34.0 pg   MCHC 34.3 30.0 - 36.0 g/dL   RDW 40.912.9 81.111.5 - 91.415.5 %   Platelets 229 150 - 400 K/uL   Neutrophils Relative % 45 %   Neutro Abs 3.2 1.7 - 7.7 K/uL   Lymphocytes Relative 47 %   Lymphs Abs 3.3 0.7 - 4.0 K/uL   Monocytes Relative 5 %   Monocytes Absolute 0.4 0.1 - 1.0 K/uL   Eosinophils Relative 2 %   Eosinophils Absolute 0.1 0.0 - 0.7 K/uL   Basophils Relative 1 %   Basophils Absolute 0.1 0.0 - 0.1 K/uL   Other PENDING %     --/--/O POS (04/13 0800)  Imaging:   Imaging Results    No results found.    MAU Course/MDM: I have ordered labs as follows: CBC, Diff Imaging ordered: done in office Results reviewed.  Consult Dr Rana SnareLowe.  Treatments in MAU included None.   Assessment: Retained products of conception  Plan: Prep for OR per Dr Rana SnareLowe Cefotetan IV preop    Medication List    ASK your doctor about these medications       ibuprofen 800 MG tablet  Commonly known as: ADVIL,MOTRIN  Take 1 tablet (800 mg total) by mouth every 8 (eight) hours as needed for moderate pain.       Encouraged to return here or to other Urgent Care/ED if she develops worsening of symptoms, increase in pain, fever, or other concerning symptoms.   Wynelle BourgeoisMarie Williams CNM, MSN Certified Nurse-Midwife 01/04/2016 2:18 PM  01/04/16 1440 I have discussed the above with that patient and her husband.  Plan D&E for retained POC and will DC home on Keflex.  She reports allergy to Ceclor but can take Amoxil.  R&B discussed and informed consent obtained. DL

## 2016-01-04 NOTE — MAU Provider Note (Signed)
Chief Complaint:  Vaginal Bleeding and Abdominal Pain   First Provider Initiated Contact with Patient 01/04/16 1409    HPI HPI: [name] CoyerSophia M [name] is a 25 y.o. [id number] who is about a week post vaginal delivery (12/27/15) presents to maternity admissions reporting chills, bleeding, and pain.  She had an US in the office and it showed retained products of conception.  She was sent here for a planned D&C by Dr Rana SnareLowe. She reports vaginal bleeding, but no vaginal itching/burning, urinary symptoms, h/a, dizziness, n/v.    [facility]. Ongoing bleeding, pain and waking up with chills/sweats. Vag Del 4/13. Baby is doing well.pt is pumping and bottle feeding.            Past Medical History: Past Medical History  Diagnosis Date  . Anxiety   . ADHD (attention deficit hyperactivity disorder)   . Panic attacks   . Vaginal Pap smear, abnormal     Past obstetric history: OB History  Gravida Para Term Preterm AB SAB TAB Ectopic Multiple Living  3 2 2  1  1   0 2    # Outcome Date GA Lbr Len/2nd Weight Sex Delivery Anes PTL Lv  3 Term 12/27/15 6686w0d 04:52 / 00:45 6 lb 5.8 oz (2.885 kg) F Vag-Spont EPI  Y  2 Term 09/07/14 1970w5d 03:24 / 03:48 7 lb 13.8 oz (3.565 kg) F Vag-Spont EPI  Y  1 TAB               Past Surgical History: Past Surgical History  Procedure Laterality Date  . Wisdom teeth removal Bilateral     2010  . Induced abortion      Family History: Family History  Problem Relation Age of Onset  . Hypertension Mother   . Heart disease Father   . Cancer Father     testicular  . Hypertension Father   . Hyperlipidemia Father   . Heart disease Paternal Grandfather     Social History: Social History  Substance Use Topics  . Smoking status: Former Smoker    Types: Cigarettes    Quit date: 08/15/2013  . Smokeless tobacco: Never Used  . Alcohol Use: No     Comment: socially, not with preg    Allergies:  Allergies  Allergen  Reactions  . Ceclor [Cefaclor] Hives    Age 63 mild rash    Meds:  Prescriptions prior to admission  Medication Sig Dispense Refill Last Dose  . ibuprofen (ADVIL,MOTRIN) 800 MG tablet Take 1 tablet (800 mg total) by mouth every 8 (eight) hours as needed for moderate pain. (Patient not taking: Reported on 01/04/2016) 30 tablet 0     I have reviewed patient's Past Medical Hx, Surgical Hx, Family Hx, Social Hx, medications and allergies.  ROS:  Review of Systems  Constitutional: Positive for fatigue. Negative for fever and chills.  Gastrointestinal: Positive for abdominal pain. Negative for nausea, vomiting, diarrhea and constipation.  Genitourinary: Positive for vaginal bleeding and pelvic pain. Negative for dysuria, flank pain and vaginal discharge.  Neurological: Negative for dizziness.   Other systems negative  Physical Exam  Patient Vitals for the past 24 hrs:  BP Temp Temp src Pulse Resp SpO2 Height Weight  01/04/16 1305 131/82 mmHg 98 F (36.7 C) Oral 94 18 97 % 5' 7.5" (1.715 m) 183 lb (83.008 kg)   Constitutional: Well-developed, well-nourished female in no acute distress.  Cardiovascular: normal rate and rhythm, no ectopy audible,  S1 & S2 heard, no murmur Respiratory: normal effort, no distress. Lungs CTAB with no wheezes or crackles GI: Abd soft, non-tender.  Nondistended.  No rebound, No guarding.  Bowel Sounds audible  MS: Extremities nontender, no edema, normal ROM Neurologic: Alert and oriented x 4.   Grossly nonfocal. GU: Neg CVAT. Skin:  Warm and Dry Psych:  Affect appropriate.  PELVIC EXAM: Deferred                          No blood on pad   Labs: Results for orders placed or performed during the hospital encounter of 01/04/16 (from the past 24 hour(s))  CBC with Differential/Platelet     Status: None (Preliminary result)   Collection Time: 01/04/16  1:30 PM  Result Value Ref Range   WBC 7.1 4.0 - 10.5 K/uL   RBC 4.22 3.87 - 5.11 MIL/uL   Hemoglobin 13.1  12.0 - 15.0 g/dL   HCT 78.2 95.6 - 21.3 %   MCV 90.5 78.0 - 100.0 fL   MCH 31.0 26.0 - 34.0 pg   MCHC 34.3 30.0 - 36.0 g/dL   RDW 08.6 57.8 - 46.9 %   Platelets 229 150 - 400 K/uL   Neutrophils Relative % 45 %   Neutro Abs 3.2 1.7 - 7.7 K/uL   Lymphocytes Relative 47 %   Lymphs Abs 3.3 0.7 - 4.0 K/uL   Monocytes Relative 5 %   Monocytes Absolute 0.4 0.1 - 1.0 K/uL   Eosinophils Relative 2 %   Eosinophils Absolute 0.1 0.0 - 0.7 K/uL   Basophils Relative 1 %   Basophils Absolute 0.1 0.0 - 0.1 K/uL   Other PENDING %   --/--/O POS (04/13 0800)  Imaging:  No results found.  MAU Course/MDM: I have ordered labs as follows:  CBC, Diff Imaging ordered: done in office Results reviewed.   Consult Dr Rana Snare.   Treatments in MAU included None.    Assessment: Retained products of conception  Plan: Prep for OR per Dr Rana Snare Cefotetan IV preop    Medication List    ASK your doctor about these medications        ibuprofen 800 MG tablet  Commonly known as:  ADVIL,MOTRIN  Take 1 tablet (800 mg total) by mouth every 8 (eight) hours as needed for moderate pain.       Encouraged to return here or to other Urgent Care/ED if she develops worsening of symptoms, increase in pain, fever, or other concerning symptoms.   Wynelle Bourgeois CNM, MSN Certified Nurse-Midwife 01/04/2016 2:18 PM

## 2016-01-04 NOTE — Discharge Instructions (Signed)
°  Post Anesthesia Home Care Instructions ° °Activity: °Get plenty of rest for the remainder of the day. A responsible adult should stay with you for 24 hours following the procedure.  °For the next 24 hours, DO NOT: °-Drive a car °-Operate machinery °-Drink alcoholic beverages °-Take any medication unless instructed by your physician °-Make any legal decisions or sign important papers. ° °Meals: °Start with liquid foods such as gelatin or soup. Progress to regular foods as tolerated. Avoid greasy, spicy, heavy foods. If nausea and/or vomiting occur, drink only clear liquids until the nausea and/or vomiting subsides. Call your physician if vomiting continues. ° °Special Instructions/Symptoms: °Your throat may feel dry or sore from the anesthesia or the breathing tube placed in your throat during surgery. If this causes discomfort, gargle with warm salt water. The discomfort should disappear within 24 hours. °DISCHARGE INSTRUCTIONS: D&C / D&E °The following instructions have been prepared to help you care for yourself upon your return home. °  °Personal hygiene: °• Use sanitary pads for vaginal drainage, not tampons. °• Shower the day after your procedure. °• NO tub baths, pools or Jacuzzis for 2-3 weeks. °• Wipe front to back after using the bathroom. ° °Activity and limitations: °• Do NOT drive or operate any equipment for 24 hours. The effects of anesthesia are still present and drowsiness may result. °• Do NOT rest in bed all day. °• Walking is encouraged. °• Walk up and down stairs slowly. °• You may resume your normal activity in one to two days or as indicated by your physician. ° °Sexual activity: NO intercourse for at least 2 weeks after the procedure, or as indicated by your physician. ° °Diet: Eat a light meal as desired this evening. You may resume your usual diet tomorrow. ° °Return to work: You may resume your work activities in one to two days or as indicated by your doctor. ° °What to expect after your  surgery: Expect to have vaginal bleeding/discharge for 2-3 days and spotting for up to 10 days. It is not unusual to have soreness for up to 1-2 weeks. You may have a slight burning sensation when you urinate for the first day. Mild cramps may continue for a couple of days. You may have a regular period in 2-6 weeks. ° °Call your doctor for any of the following: °• Excessive vaginal bleeding, saturating and changing one pad every hour. °• Inability to urinate 6 hours after discharge from hospital. °• Pain not relieved by pain medication. °• Fever of 100.4° F or greater. °• Unusual vaginal discharge or odor. ° ° Call for an appointment:  ° ° °Patient’s signature: ______________________ ° °Nurse’s signature ________________________ ° °Support person's signature_______________________ ° ° ° °

## 2016-01-04 NOTE — Op Note (Signed)
NAMRosemarie Beath:  [name], [name] [name]                ACCOUNT NO.:  [id number]  MEDICAL RECORD NO.:  [id number]  LOCATION:  [facility]                          FACILITY:  [facility]  PHYSICIAN:  Dineen KidDavid C. Rana SnareLowe, M.D.    DATE OF BIRTH:  01/30/1991  DATE OF PROCEDURE:  01/04/2016 DATE OF DISCHARGE:  01/04/2016                              OPERATIVE REPORT   PREOPERATIVE DIAGNOSIS:  Postpartum retained products of conception and endometritis.  POSTOPERATIVE DIAGNOSIS:  Postpartum retained products of conception and endometritis.  PROCEDURE:  Dilation and curettage and suction curettage.  SURGEON:  Dineen Kidavid C. Rana SnareLowe, MD  ANESTHESIA:  Monitored anesthetic care.  INDICATIONS:  [name] is 25 year old, postpartum, presented to the office with uterine tenderness and achy feeling, and low-grade fever, underwent an ultrasound showing some retained products of conception. She presents today for dilation and curettage or suction curettage for removal of these products.  Risks and benefits were discussed and informed consent was obtained.  DESCRIPTION OF PROCEDURE:  After adequate analgesia, the patient was given 2 g of cefotetan.  She is sterilely prepped and draped.  Bladder sterilely drained.  Graves speculum was placed.  Tenaculum placed on the anterior lip of the cervix.  Gentle sharp curettage was carried out retrieving small fragments of retained products of conception.  This was followed by gentle suction curettage with 8 mm suction curette retrieving blood clots and other probable retained products of conception until gritty surface was felt throughout the endometrial cavity.  Good uterine response was noted with minimal bleeding.  The curette was then removed.  Tenaculum removed from the cervix, stable with minimal bleeding.  Patient was transferred to recovery in stable condition.  Estimated blood loss was minimal.  Again, the patient received 2 g of cefotetan preoperatively and 30 mg  Toradol postoperatively.  DISPOSITION:  The patient will be discharged to home.  Follow up in the office 2-3 weeks.  Sent home with a prescription for Augmentin 875 to take twice a day for 7 days.  Told to return for increased pain, fever, or bleeding.     Dineen Kidavid C. Rana SnareLowe, M.D.     DCL/MEDQ  D:  01/04/2016  T:  01/04/2016  Job:  [id number]

## 2016-01-05 ENCOUNTER — Inpatient Hospital Stay (HOSPITAL_COMMUNITY)
Admission: AD | Admit: 2016-01-05 | Discharge: 2016-01-05 | Disposition: A | Discharge status: Home / Self Care | Payer: BLUE CROSS/BLUE SHIELD | Source: Ambulatory Visit | Attending: Obstetrics and Gynecology | Admitting: Obstetrics and Gynecology

## 2016-01-05 ENCOUNTER — Encounter (HOSPITAL_COMMUNITY): Payer: Self-pay | Admitting: *Deleted

## 2016-01-05 DIAGNOSIS — R202 Paresthesia of skin: Secondary | ICD-10-CM | POA: Insufficient documentation

## 2016-01-05 DIAGNOSIS — Z9889 Other specified postprocedural states: Secondary | ICD-10-CM | POA: Insufficient documentation

## 2016-01-05 DIAGNOSIS — M7918 Myalgia, other site: Secondary | ICD-10-CM

## 2016-01-05 NOTE — MAU Provider Note (Signed)
History     CSN: [id number] Arrival date and time: 01/05/16 0300 First Provider Initiated Contact with Patient 01/05/16 [phone]      Chief Complaint  Patient presents with  . Tingling   HPI [name] [name] is a 25 y.o. presenting with bilateral leg and hand tingling at home that has since resolved. She reports sitting in stiff positions and feels this might be related to that. Denies dizziness, syncope, lightheadedness. Denies red swollen leg, pain in legs,SOB  OB History    Gravida Para Term Preterm AB TAB SAB Ectopic Multiple Living   0 2      Past Medical History  Diagnosis Date  . Anxiety   . ADHD (attention deficit hyperactivity disorder)   . Panic attacks   . Vaginal Pap smear, abnormal     Past Surgical History  Procedure Laterality Date  . Wisdom teeth removal Bilateral     2010  . Induced abortion    . Dilation and curettage of uterus  01/04/16    Family History  Problem Relation Age of Onset  . Hypertension Mother   . Heart disease Father   . Cancer Father     testicular  . Hypertension Father   . Hyperlipidemia Father   . Heart disease Paternal Grandfather     Social History  Substance Use Topics  . Smoking status: Former Smoker    Types: Cigarettes    Quit date: 08/15/2013  . Smokeless tobacco: Never Used  . Alcohol Use: No     Comment: socially, not with preg    Allergies:  Allergies  Allergen Reactions  . Ceclor [Cefaclor] Hives    Age 25 mild rash    Prescriptions prior to admission  Medication Sig Dispense Refill Last Dose  . ibuprofen (ADVIL,MOTRIN) 200 MG tablet Take 400 mg by mouth every 6 (six) hours as needed.   01/04/2016 at Unknown time  . ibuprofen (ADVIL,MOTRIN) 800 MG tablet Take 1 tablet (800 mg total) by mouth every 8 (eight) hours as needed for moderate pain. 30 tablet 0 Past Week at Unknown time  . oxyCODONE-acetaminophen (PERCOCET/ROXICET) 5-325 MG tablet Take by mouth every 4 (four) hours as needed for severe  pain.   Past Week at Unknown time  . amoxicillin-clavulanate (AUGMENTIN) 875-125 MG tablet Take 1 tablet by mouth 2 (two) times daily. 14 tablet 0     Review of Systems  Constitutional: Negative for fever and chills.  Eyes: Negative for blurred vision and double vision.  Respiratory: Negative for cough and shortness of breath.   Cardiovascular: Negative for chest pain and orthopnea.  Gastrointestinal: Negative for nausea and vomiting.  Genitourinary: Negative for dysuria, frequency and flank pain.  Musculoskeletal: Negative for myalgias.  Skin: Negative for rash.  Neurological: Negative for dizziness, tingling, weakness and headaches.  Endo/Heme/Allergies: Does not bruise/bleed easily.  Psychiatric/Behavioral: Negative for depression and suicidal ideas. The patient is not nervous/anxious.    Physical Exam   Blood pressure 130/82, pulse 72, temperature 97.7 F (36.5 C), resp. rate 18, height 5' 7.5" (1.715 m), weight 188 lb 6.4 oz (85.458 kg), SpO2 99 %, not currently breastfeeding.  Physical Exam  Nursing note and vitals reviewed. Constitutional: She is oriented to person, place, and time. She appears well-developed and well-nourished. No distress.  HENT:  Head: Normocephalic and atraumatic.  Eyes: Conjunctivae are normal. No scleral icterus.  Neck: Normal range of motion. Neck supple.  Cardiovascular: Normal rate and intact  distal pulses.   Negative homan's sign bilaterally. Calves are equal and easily compressible without pain.   Respiratory: Effort normal and breath sounds normal. No respiratory distress. She has no wheezes. She exhibits no tenderness.  GI: Soft. There is no tenderness. There is no rebound and no guarding.  Genitourinary: Vagina normal.  Musculoskeletal: Normal range of motion. She exhibits no edema.  Neurological: She is alert and oriented to person, place, and time.  Skin: Skin is warm and dry. No rash noted.  Psychiatric: She has a normal mood and affect.     MAU Course  Procedures  MDM- most likely MSK related. Unlikely DVT given normal calf exam. Unlikely PE given normal HR and lack of hypoxia. Unlikely anemia in the setting of no other sx and a recently normal hgb.   4:56 AM discussed case with Dr. Rana SnareLowe who agreed with the work up and plan.   Assessment and Plan   Tingling in legs and arms-- resolved prior to arrival. Reassuring physical exam and vitals.  - likely msk related vs anxiety - discussed s/sx of DVT and symptoms  Federico FlakeKimberly Niles Sherese Heyward 01/05/2016, 4:32 AM

## 2016-01-05 NOTE — Progress Notes (Signed)
Dr Newton notified of pt's admission and status. Will see pt 

## 2016-01-05 NOTE — MAU Note (Signed)
SVD 4/13. Friday 4/21 had D&C for retained products. Now have tingling in both legs and alittle bit in arms but worse in legs. Started few hours ago. Now bleeding small amt dark blood. Vagina sore but no other pain

## 2016-01-05 NOTE — Progress Notes (Signed)
Dr Newton in earlier to discuss d/c plan with pt. Written and verbal d/c instructions given and understanding voiced. 

## 2016-01-08 ENCOUNTER — Encounter (HOSPITAL_COMMUNITY): Payer: Self-pay | Admitting: Obstetrics and Gynecology

## 2016-01-10 DIAGNOSIS — R197 Diarrhea, unspecified: Secondary | ICD-10-CM | POA: Diagnosis not present

## 2016-01-13 DIAGNOSIS — L22 Diaper dermatitis: Secondary | ICD-10-CM | POA: Diagnosis not present

## 2016-01-15 DIAGNOSIS — D509 Iron deficiency anemia, unspecified: Secondary | ICD-10-CM | POA: Diagnosis not present

## 2016-02-07 DIAGNOSIS — Z30430 Encounter for insertion of intrauterine contraceptive device: Secondary | ICD-10-CM | POA: Diagnosis not present

## 2016-02-07 DIAGNOSIS — Z3202 Encounter for pregnancy test, result negative: Secondary | ICD-10-CM | POA: Diagnosis not present

## 2016-02-07 DIAGNOSIS — Z1389 Encounter for screening for other disorder: Secondary | ICD-10-CM | POA: Diagnosis not present

## 2016-02-13 DIAGNOSIS — J069 Acute upper respiratory infection, unspecified: Secondary | ICD-10-CM | POA: Diagnosis not present

## 2016-03-11 ENCOUNTER — Ambulatory Visit (INDEPENDENT_AMBULATORY_CARE_PROVIDER_SITE_OTHER): Payer: BLUE CROSS/BLUE SHIELD | Admitting: Physician Assistant

## 2016-03-11 ENCOUNTER — Ambulatory Visit: Payer: BLUE CROSS/BLUE SHIELD | Admitting: Family Medicine

## 2016-03-11 VITALS — BP 140/90 | HR 96 | Temp 97.7°F | Resp 16 | Ht 67.5 in | Wt 164.0 lb

## 2016-03-11 DIAGNOSIS — F411 Generalized anxiety disorder: Secondary | ICD-10-CM

## 2016-03-11 DIAGNOSIS — F988 Other specified behavioral and emotional disorders with onset usually occurring in childhood and adolescence: Secondary | ICD-10-CM

## 2016-03-11 DIAGNOSIS — F909 Attention-deficit hyperactivity disorder, unspecified type: Secondary | ICD-10-CM | POA: Diagnosis not present

## 2016-03-11 MED ORDER — AMPHETAMINE-DEXTROAMPHETAMINE 20 MG PO TABS: 20.0000 mg | ORAL_TABLET | Freq: Two times a day (BID) | ORAL | Status: DC

## 2016-03-11 MED ORDER — CLONAZEPAM 1 MG PO TABS: 1.0000 mg | ORAL_TABLET | Freq: Every day | ORAL | Status: DC | PRN

## 2016-03-11 NOTE — Patient Instructions (Addendum)
     IF you received an x-ray today, you will receive an invoice from [facility]. Please contact [facility] at [phone] with questions or concerns regarding your invoice.   IF you received labwork today, you will receive an invoice from [facility]. Please contact [facility] at [phone] with questions or concerns regarding your invoice.   Our billing staff will not be able to assist you with questions regarding bills from these companies.  You will be contacted with the lab results as soon as they are available. The fastest way to get your results is to activate your My Chart account. Instructions are located on the last page of this paperwork. If you have not heard from us regarding the results in 2 weeks, please contact this office.    I have refilled the klonopin for 1 month, and the adderall for 3 months.   Please let me know if you have any questions or concerns in the meantime.  You will be able to make an appointment the day before at 4pm, unless this system changes.

## 2016-03-11 NOTE — Progress Notes (Signed)
[facility] [address], [place] [id number] [phone]  Date:  03/11/2016   Name:  [name] CoyerSophia M [name]   DOB:  Mar 17, 1991   MRN:  [id number]  PCP:  No primary care provider on file.    History of Present Illness:  [name] [name] is a 25 y.o. female patient who [facility] for cc of anxiety and ADHD.    [place]  Dr. Marcelle OverlieHolland of the adderrall and klonopin at [facility].     Klonopin: generalized anxiety disorder with panic, best controlled the anxiety.  Klonopin 3-4 times per week. 1mg .  No panic attacks in couple years.  She has attempted multiple SSRI.  She has tried lexapro, zoloft, seroquel, trazadone.  Has issues waking with trazodone  Since the beginning of college.   Psychologist: [facility], Dr. America BrownPeter Parrol.   She is currently not breastfeeding.   Adderall: middle school, and [facility]--given extra timing.  Inattentive moreso of symptoms.       EtOH: 2 glasses of wine twice per week Tobacco use: none Illicit drug use: none She attempts to walking, but no exercise at this time.    Patient Active Problem List   Diagnosis Date Noted  . Normal labor and delivery 12/27/2015  . Oligohydramnios 10/24/2015  . ADHD (attention deficit hyperactivity disorder) 10/27/2014  . GAD (generalized anxiety disorder) 10/27/2014  . Gestational hypertension 09/07/2014  . Normal labor 09/06/2014    Past Medical History  Diagnosis Date  . Anxiety   . ADHD (attention deficit hyperactivity disorder)   . Panic attacks   . Vaginal Pap smear, abnormal     Past Surgical History  Procedure Laterality Date  . Wisdom teeth removal Bilateral     2010  . Induced abortion    . Dilation and curettage of uterus  01/04/16  . Dilation and evacuation N/A 01/04/2016    Procedure: DILATATION AND EVACUATION;  Surgeon: Candice Campavid Lowe, MD;  Location: [facility];  Service: Gynecology;  Laterality: N/A;    Social History  Substance Use Topics   . Smoking status: Former Smoker    Types: Cigarettes    Quit date: 08/15/2013  . Smokeless tobacco: Never Used  . Alcohol Use: No     Comment: socially, not with preg    Family History  Problem Relation Age of Onset  . Hypertension Mother   . Heart disease Father   . Cancer Father     testicular  . Hypertension Father   . Hyperlipidemia Father   . Heart disease Paternal Grandfather     Allergies  Allergen Reactions  . Ceclor [Cefaclor] Hives    Age 62 mild rash    Medication list has been reviewed and updated.  Current Outpatient Prescriptions on File Prior to Visit  Medication Sig Dispense Refill  . ibuprofen (ADVIL,MOTRIN) 200 MG tablet Take 400 mg by mouth every 6 (six) hours as needed.    Marland Kitchen. ibuprofen (ADVIL,MOTRIN) 800 MG tablet Take 1 tablet (800 mg total) by mouth every 8 (eight) hours as needed for moderate pain. (Patient not taking: Reported on 03/11/2016) 30 tablet 0  . oxyCODONE-acetaminophen (PERCOCET/ROXICET) 5-325 MG tablet Take by mouth every 4 (four) hours as needed for severe pain. Reported on 03/11/2016     No current facility-administered medications on file prior to visit.    ROS ROS otherwise unremarkable unless listed above.   Physical Examination: BP 140/90 mmHg  Pulse 96  Temp(Src) 97.7 F (36.5 C) (Oral)  Resp 16  Ht 5' 7.5" (1.715 m)  Wt 164 lb (74.39 kg)  BMI 25.29 kg/m2  SpO2 100%  LMP 03/11/2016 (LMP Unknown) Ideal Body Weight: Weight in (lb) to have BMI = 25: 161.7  Physical Exam  Constitutional: She is oriented to person, place, and time. She appears well-developed and well-nourished. No distress.  HENT:  Head: Normocephalic and atraumatic.  Right Ear: External ear normal.  Left Ear: External ear normal.  Mouth/Throat: No oropharyngeal exudate.  Eyes: Conjunctivae and EOM are normal. Pupils are equal, round, and reactive to light.  Cardiovascular: Normal rate, regular rhythm and normal heart sounds.  Exam reveals no friction  rub.   No murmur heard. Pulmonary/Chest: Effort normal and breath sounds normal. No respiratory distress. She has no wheezes.  Genitourinary: Uterus normal. Pelvic exam was performed with patient supine. There is no rash on the right labia. There is no rash on the left labia.  Neurological: She is alert and oriented to person, place, and time. No cranial nerve deficit. Coordination normal.  Skin: She is not diaphoretic.  Psychiatric: She has a normal mood and affect. Her behavior is normal.   Assessment and Plan: [name] CoyerSophia M Oliveira is a 25 y.o. female who is here today for medication refill and to establish care. She will rtc in 3 months for recheck.  Advised to return in 6 months for full annual physical exam.  ADD (attention deficit disorder) - Plan: amphetamine-dextroamphetamine (ADDERALL) 20 MG tablet, DISCONTINUED: amphetamine-dextroamphetamine (ADDERALL) 20 MG tablet, DISCONTINUED: amphetamine-dextroamphetamine (ADDERALL) 20 MG tablet  Generalized anxiety disorder - Plan: clonazePAM (KLONOPIN) 1 MG tablet  Trena PlattStephanie English, PA-C [facility] 03/11/2016 8:40 AM

## 2016-04-17 ENCOUNTER — Other Ambulatory Visit: Payer: Self-pay | Admitting: Physician Assistant

## 2016-04-17 DIAGNOSIS — F411 Generalized anxiety disorder: Secondary | ICD-10-CM

## 2016-04-24 ENCOUNTER — Other Ambulatory Visit: Payer: Self-pay | Admitting: Physician Assistant

## 2016-04-24 DIAGNOSIS — F411 Generalized anxiety disorder: Secondary | ICD-10-CM

## 2016-04-25 ENCOUNTER — Other Ambulatory Visit: Payer: Self-pay | Admitting: Physician Assistant

## 2016-04-25 DIAGNOSIS — F411 Generalized anxiety disorder: Secondary | ICD-10-CM

## 2016-04-28 MED ORDER — CLONAZEPAM 1 MG PO TABS: 1.0000 mg | ORAL_TABLET | Freq: Every day | ORAL | 0 refills | Status: DC | PRN

## 2016-04-28 NOTE — Telephone Encounter (Signed)
Duplicate req. This was faxed already.

## 2016-04-28 NOTE — Telephone Encounter (Signed)
Faxed RF with confirmation. [name] CornfieldStephanie spoke to pt and advised we were faxing it.

## 2016-04-28 NOTE — Telephone Encounter (Signed)
Duplicate message. This was faxed in, see notes under 8/10 pt email.

## 2016-05-13 ENCOUNTER — Ambulatory Visit (INDEPENDENT_AMBULATORY_CARE_PROVIDER_SITE_OTHER): Payer: BLUE CROSS/BLUE SHIELD

## 2016-05-13 ENCOUNTER — Ambulatory Visit (INDEPENDENT_AMBULATORY_CARE_PROVIDER_SITE_OTHER): Payer: BLUE CROSS/BLUE SHIELD | Admitting: Family Medicine

## 2016-05-13 VITALS — BP 130/88 | HR 109 | Temp 98.5°F | Resp 18 | Ht 67.5 in | Wt 155.0 lb

## 2016-05-13 DIAGNOSIS — Z23 Encounter for immunization: Secondary | ICD-10-CM | POA: Diagnosis not present

## 2016-05-13 DIAGNOSIS — M25562 Pain in left knee: Secondary | ICD-10-CM | POA: Diagnosis not present

## 2016-05-13 IMAGING — DX DG KNEE COMPLETE 4+V*L*
4 series · 4 of 4 positions shown · non-contrast
Comparison: None.

CLINICAL DATA: Left knee pain.  No known injury.

EXAM:
LEFT KNEE - COMPLETE 4+ VIEW

[knee ap]
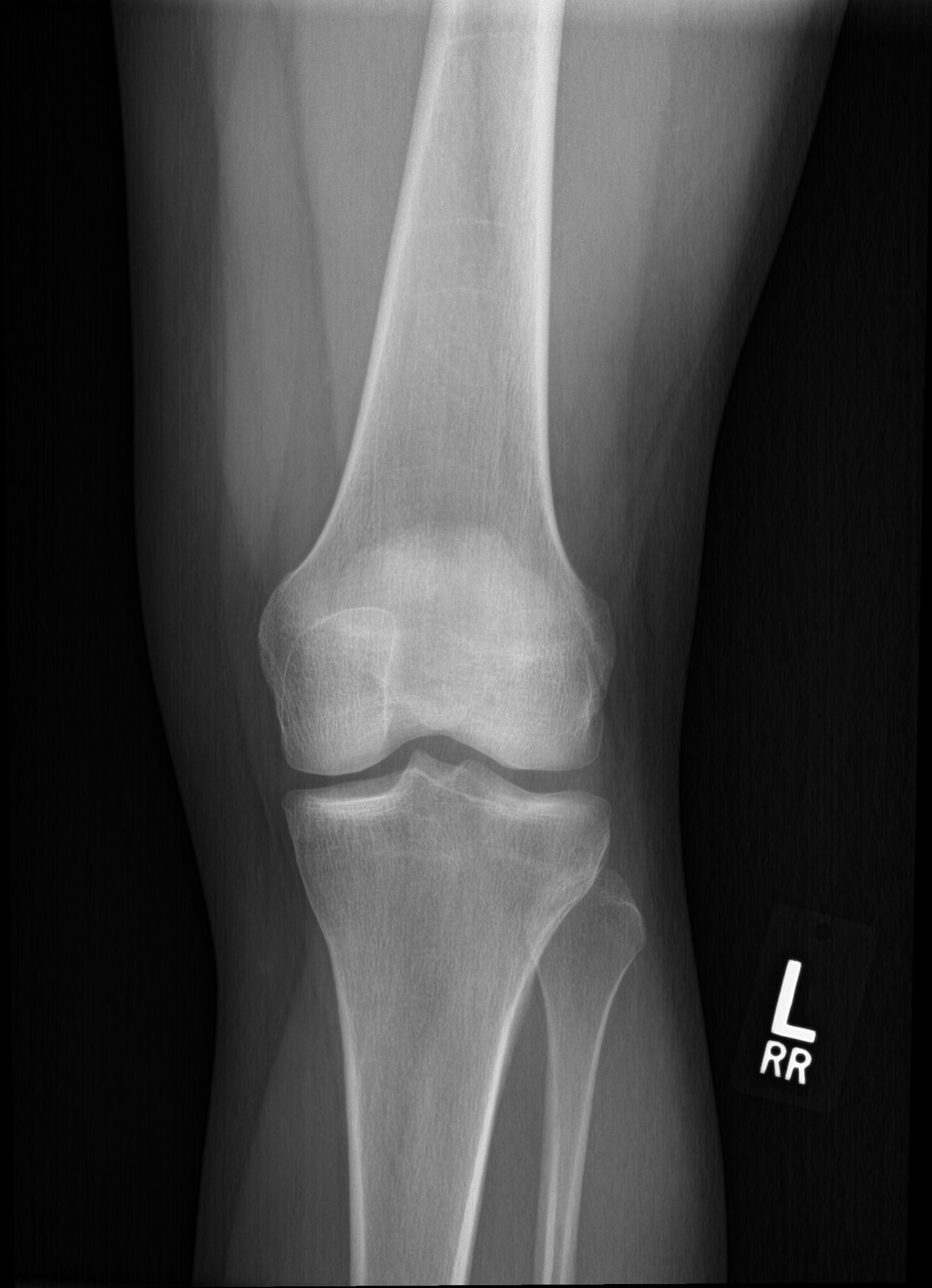

[knee obl (1 of 2)]
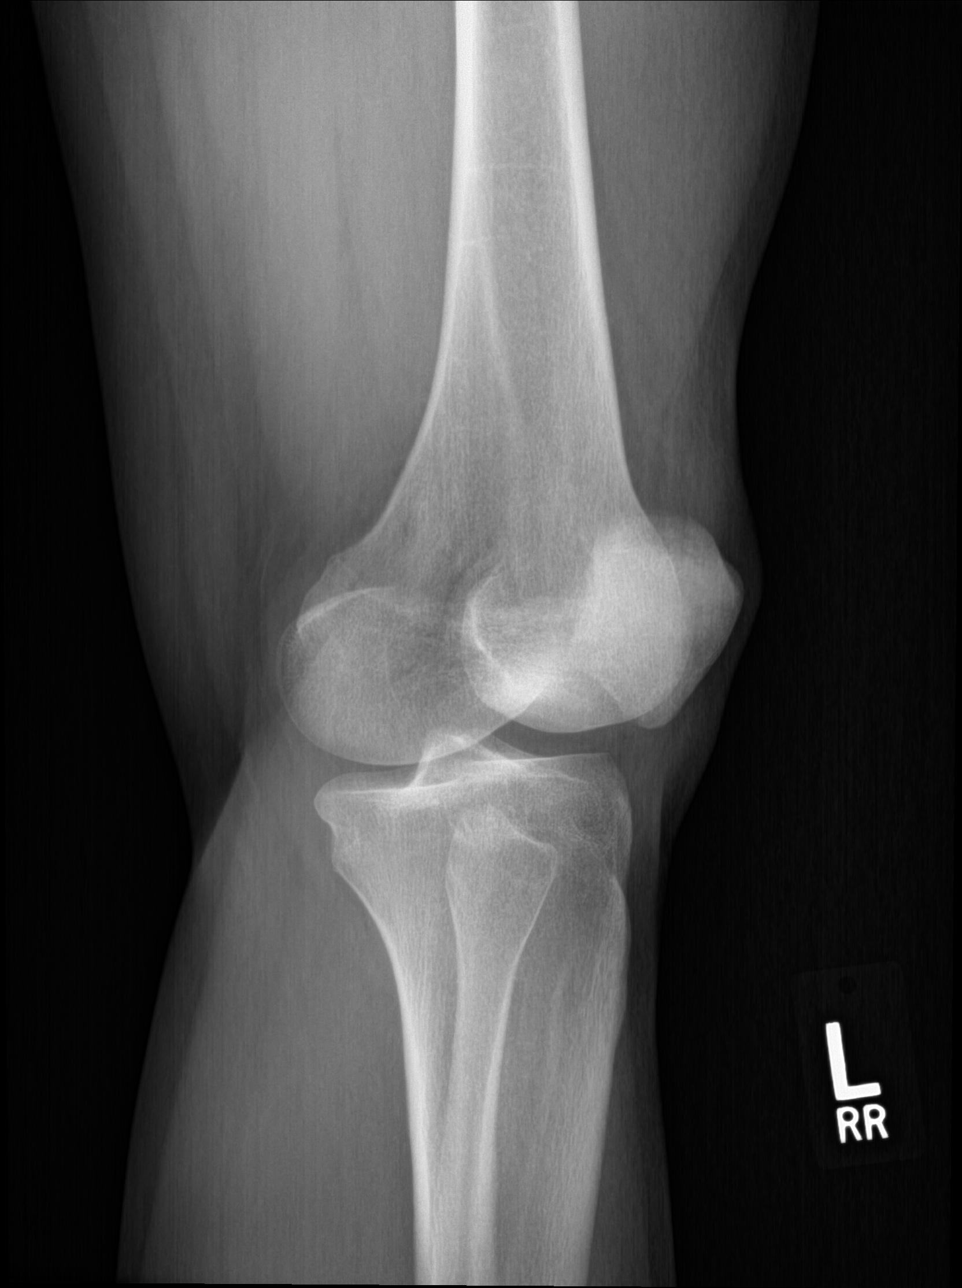

[knee obl (2 of 2)]
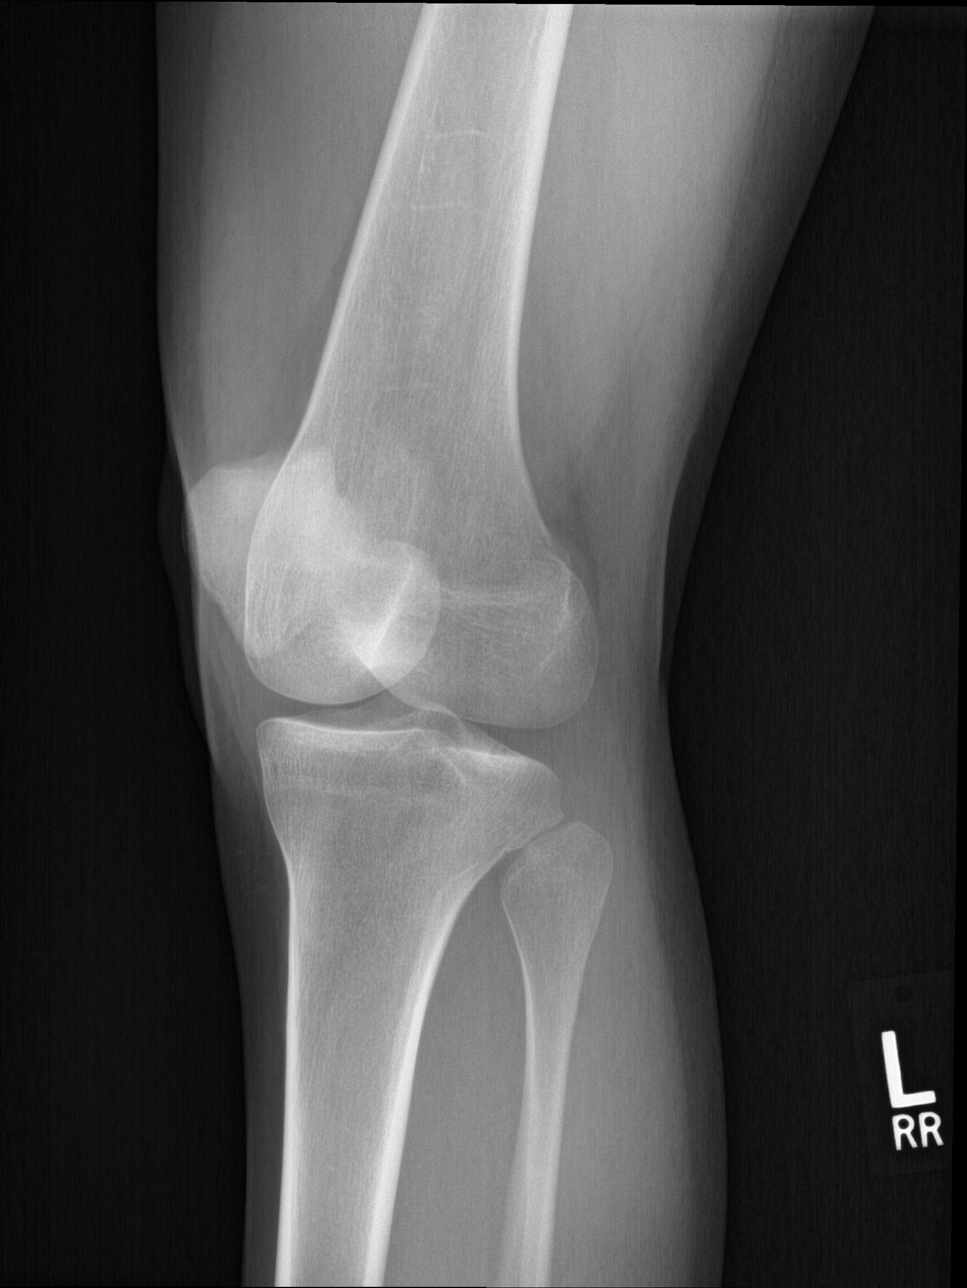

[knee lat]
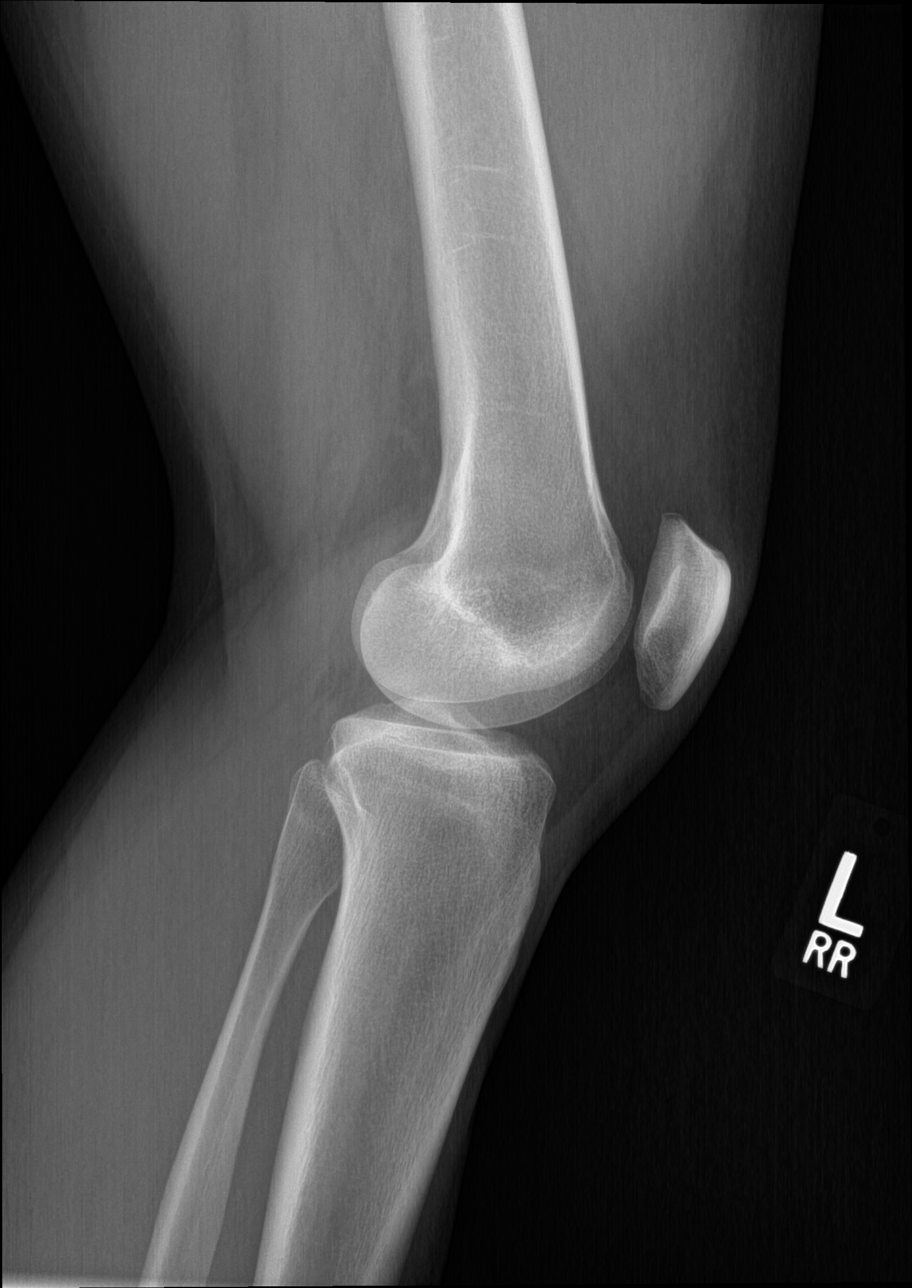

[4 of 4 positions shown; findings below may reference images not displayed]

FINDINGS: No evidence of fracture, dislocation, or joint effusion. No evidence
of arthropathy or other focal bone abnormality. Soft tissues are
unremarkable.
IMPRESSION: Negative.

## 2016-05-13 MED ORDER — PREDNISONE 20 MG PO TABS: ORAL_TABLET | ORAL | 0 refills | Status: DC

## 2016-05-13 NOTE — Patient Instructions (Addendum)
Take prednisone 3 pills times 3 days, 2 pills times 3 days, and 1 pill times 3 days.  Follow-up as needed!   IF you received an x-ray today, you will receive an invoice from [facility]. Please contact [facility] at [phone] with questions or concerns regarding your invoice.   IF you received labwork today, you will receive an invoice from [facility]. Please contact [facility] at [phone] with questions or concerns regarding your invoice.   Our billing staff will not be able to assist you with questions regarding bills from these companies.  You will be contacted with the lab results as soon as they are available. The fastest way to get your results is to activate your My Chart account. Instructions are located on the last page of this paperwork. If you have not heard from us regarding the results in 2 weeks, please contact this office.     Knee Pain Knee pain is a common problem. It can have many causes. The pain often goes away by following your doctor's home care instructions. Treatment for ongoing pain will depend on the cause of your pain. If your knee pain continues, more tests may be needed to diagnose your condition. Tests may include X-rays or other imaging studies of your knee. HOME CARE  Take medicines only as told by your doctor.  Rest your knee and keep it raised (elevated) while you are resting.  Do not do things that cause pain or make your pain worse.  Avoid activities where both feet leave the ground at the same time, such as running, jumping rope, or doing jumping jacks.  Apply ice to the knee area:  Put ice in a plastic bag.  Place a towel between your skin and the bag.  Leave the ice on for 20 minutes, 2-3 times a day.  Ask your doctor if you should wear an elastic knee support.  Sleep with a pillow under your knee.  Lose weight if you are overweight. Being overweight can make your knee hurt more.  Do not  use any tobacco products, including cigarettes, chewing tobacco, or electronic cigarettes. If you need help quitting, ask your doctor. Smoking may slow the healing of any bone and joint problems that you may have. GET HELP IF:  Your knee pain does not stop, it changes, or it gets worse.  You have a fever along with knee pain.  Your knee gives out or locks up.  Your knee becomes more swollen. GET HELP RIGHT AWAY IF:   Your knee feels hot to the touch.  You have chest pain or trouble breathing.   This information is not intended to replace advice given to you by your health care provider. Make sure you discuss any questions you have with your health care provider.   Document Released: 11/28/2008 Document Revised: 09/22/2014 Document Reviewed: 11/02/2013 Elsevier Interactive Patient Education [employer]2016 Elsevier Inc.

## 2016-05-13 NOTE — Progress Notes (Signed)
Patient ID: KAMREE WIENS, female    DOB: 31-Jul-1991, 25 y.o.   MRN: [id number]  PCP: No PCP Per Patient  Chief Complaint  Patient presents with  . Knee Pain    NKI. Started today.   . Flu Vaccine    Subjective:   HPI Presents for evaluation of knee pain left.  25 year old female presents with acute onset of left knee pain without known injury. She has two small children and uncertain of how she injured knee. The pain started around noon today and has progressively worsened.  Knee pain is described as aching. Pain 0/10-8/10. Pain is worst with flexion of left leg and with dorsiflexion left foot.  . Social History   Social History  . Marital status: Married    Spouse name: N/A  . Number of children: N/A  . Years of education: N/A   Occupational History  . Not on file.   Social History Main Topics  . Smoking status: Former Smoker    Types: Cigarettes    Quit date: 08/15/2013  . Smokeless tobacco: Never Used  . Alcohol use No     Comment: socially, not with preg  . Drug use: No  . Sexual activity: Not Currently    Birth control/ protection: None   Other Topics Concern  . Not on file   Social History Narrative  . No narrative on file    . Family History  Problem Relation Age of Onset  . Hypertension Mother   . Heart disease Father   . Cancer Father     testicular  . Hypertension Father   . Hyperlipidemia Father   . Heart disease Paternal Grandfather      Review of Systems  Musculoskeletal:       SEE HPI    Patient Active Problem List   Diagnosis Date Noted  . Normal labor and delivery 12/27/2015  . Oligohydramnios 10/24/2015  . ADHD (attention deficit hyperactivity disorder) 10/27/2014  . GAD (generalized anxiety disorder) 10/27/2014  . Gestational hypertension 09/07/2014  . Normal labor 09/06/2014     Prior to Admission medications   Medication Sig Start Date End Date Taking? Authorizing Provider  amphetamine-dextroamphetamine (ADDERALL)  20 MG tablet Take 1 tablet (20 mg total) by mouth 2 (two) times daily. Fill after 60 days 03/11/16  Yes Stephanie D English, PA  clonazePAM (KLONOPIN) 1 MG tablet Take 1 mg by mouth 2 (two) times daily.   Yes Historical Provider, MD  clonazePAM (KLONOPIN) 1 MG tablet Take 1 tablet (1 mg total) by mouth daily as needed for anxiety. 04/28/16  Yes Stephanie D English, PA  ibuprofen (ADVIL,MOTRIN) 200 MG tablet Take 400 mg by mouth every 6 (six) hours as needed.   Yes Historical Provider, MD  oxyCODONE-acetaminophen (PERCOCET/ROXICET) 5-325 MG tablet Take by mouth every 4 (four) hours as needed for severe pain. Reported on 03/11/2016    Historical Provider, MD     Allergies  Allergen Reactions  . Ceclor [Cefaclor] Hives    Age 70 mild rash       Objective:  Physical Exam  Constitutional: She is oriented to person, place, and time. She appears well-developed and well-nourished.  HENT:  Head: Normocephalic and atraumatic.  Right Ear: External ear normal.  Left Ear: External ear normal.  Musculoskeletal: She exhibits tenderness. She exhibits no edema.  Erythema present at the anterior medial patellar region of the knee.  No evidence of effusion. Tenderness with palpation the medial lateral region at  the knee. Increased tenderness with flexion of the leg and dorsiflexion of the foot. Decrease tenderness with extension of leg and plantar flexion of the foot.   Neurological: She is alert and oriented to person, place, and time.  Skin: Skin is warm and dry.   . Vitals:   05/13/16 1743  BP: 130/88  Pulse: (!) 109  Resp: 18  Temp: 98.5 F (36.9 C)   .Dg Knee Complete 4 Views Left  Result Date: 05/13/2016 CLINICAL DATA:  Left knee pain.  No known injury. EXAM: LEFT KNEE - COMPLETE 4+ VIEW COMPARISON:  None. FINDINGS: No evidence of fracture, dislocation, or joint effusion. No evidence of arthropathy or other focal bone abnormality. Soft tissues are unremarkable. IMPRESSION: Negative.  Electronically Signed   By: Burman NievesWilliam  Stevens M.D.   On: 05/13/2016 18:41   Assessment & Plan:  1. Influenza vaccine administered - Flu Vaccine QUAD 36+ mos IM 2. Left knee pain, no acute or significant findings on x-ray or exam. Treat knee for inflammation. - DG Knee Complete 4 Views Left . . predniSONE (DELTASONE) 20 MG tablet    Sig: Take 3 PO QAM x3days, 2 PO QAM x3days, 1 PO QAM x3days   Follow-up if no improvement or worsens.  Godfrey PickKimberly S. Tiburcio PeaHarris, MSN, FNP-C [facility]

## 2016-05-15 ENCOUNTER — Ambulatory Visit (INDEPENDENT_AMBULATORY_CARE_PROVIDER_SITE_OTHER): Payer: BLUE CROSS/BLUE SHIELD | Admitting: Family Medicine

## 2016-05-15 VITALS — BP 102/66 | HR 80 | Temp 98.9°F | Resp 18 | Ht 67.5 in | Wt 157.0 lb

## 2016-05-15 DIAGNOSIS — F988 Other specified behavioral and emotional disorders with onset usually occurring in childhood and adolescence: Secondary | ICD-10-CM

## 2016-05-15 DIAGNOSIS — L739 Follicular disorder, unspecified: Secondary | ICD-10-CM | POA: Diagnosis not present

## 2016-05-15 DIAGNOSIS — F411 Generalized anxiety disorder: Secondary | ICD-10-CM

## 2016-05-15 DIAGNOSIS — F909 Attention-deficit hyperactivity disorder, unspecified type: Secondary | ICD-10-CM

## 2016-05-15 MED ORDER — AMPHETAMINE-DEXTROAMPHETAMINE 20 MG PO TABS: 20.0000 mg | ORAL_TABLET | Freq: Two times a day (BID) | ORAL | 0 refills | Status: DC

## 2016-05-15 MED ORDER — DOXYCYCLINE HYCLATE 100 MG PO CAPS: 100.0000 mg | ORAL_CAPSULE | Freq: Two times a day (BID) | ORAL | 0 refills | Status: DC

## 2016-05-15 MED ORDER — CLONAZEPAM 1 MG PO TABS: 1.0000 mg | ORAL_TABLET | Freq: Every day | ORAL | 2 refills | Status: DC | PRN

## 2016-05-15 NOTE — Patient Instructions (Signed)
     IF you received an x-ray today, you will receive an invoice from [facility]. Please contact [facility] at [phone] with questions or concerns regarding your invoice.   IF you received labwork today, you will receive an invoice from [facility]. Please contact [facility] at [phone] with questions or concerns regarding your invoice.   Our billing staff will not be able to assist you with questions regarding bills from these companies.  You will be contacted with the lab results as soon as they are available. The fastest way to get your results is to activate your My Chart account. Instructions are located on the last page of this paperwork. If you have not heard from us regarding the results in 2 weeks, please contact this office.      

## 2016-05-15 NOTE — Progress Notes (Signed)
Subjective:    Patient ID: [name] CoyerSophia M [name], female    DOB: [birth year], 25 y.o.   MRN: [id number]  By signing my name below, I, Javier Dockerobert Ryan Halas, attest that this documentation has been prepared under the direction and in the presence of Oswaldo DoneKristy Reis Goga, MD. Electronically Signed: Javier Dockerobert Ryan Halas, ER Scribe. 05/15/2016. 11:22 AM.  05/15/2016  Other (abcess on bikini area)  HPI HPI Comments: [name] [name] is a 25 y.o. female who [facility] complaining of a slightly discolored brownish cyst in her groin that she noticed three weeks ago. She has a past hx of abscesses. She denies fever or chills. Her four month old child has been in the hospital repeatedly over the last several months for surgery. She also requested refill of her adderrall and klonopin. She states she has been having increasing anxiety due to her childs health issues. She previously saw Dr. Patsy Lageropland, but since Dr. Patsy Lageropland left the practice she has been looking for another provider, and would like to establish with me. She is aware that she can only fill those medications with one provider.   Review of Systems  Constitutional: Negative for chills, diaphoresis, fatigue and fever.  Eyes: Negative for visual disturbance.  Respiratory: Negative for cough and shortness of breath.   Cardiovascular: Negative for chest pain, palpitations and leg swelling.  Gastrointestinal: Negative for abdominal pain, constipation, diarrhea, nausea and vomiting.  Endocrine: Negative for cold intolerance, heat intolerance, polydipsia, polyphagia and polyuria.  Skin: Positive for color change. Negative for wound.  Neurological: Negative for dizziness, tremors, seizures, syncope, facial asymmetry, speech difficulty, weakness, light-headedness, numbness and headaches.  Psychiatric/Behavioral: Negative for dysphoric mood. The patient is nervous/anxious.     Past Medical History:  Diagnosis Date  . ADHD (attention deficit hyperactivity disorder)   .  Anxiety   . Panic attacks   . Vaginal Pap smear, abnormal    Past Surgical History:  Procedure Laterality Date  . DILATION AND CURETTAGE OF UTERUS  01/04/16  . DILATION AND EVACUATION N/A 01/04/2016   Procedure: DILATATION AND EVACUATION;  Surgeon: Candice Campavid Lowe, MD;  Location: [facility];  Service: Gynecology;  Laterality: N/A;  . INDUCED ABORTION    . wisdom teeth removal Bilateral    2010   Allergies  Allergen Reactions  . Ceclor [Cefaclor] Hives    Age 24 mild rash    Social History   Social History  . Marital status: Married    Spouse name: N/A  . Number of children: N/A  . Years of education: N/A   Occupational History  . Not on file.   Social History Main Topics  . Smoking status: Former Smoker    Types: Cigarettes    Quit date: 08/15/2013  . Smokeless tobacco: Never Used  . Alcohol use No     Comment: socially, not with preg  . Drug use: No  . Sexual activity: Not Currently    Birth control/ protection: None   Other Topics Concern  . Not on file   Social History Narrative  . No narrative on file   Family History  Problem Relation Age of Onset  . Hypertension Mother   . Heart disease Father   . Cancer Father     testicular  . Hypertension Father   . Hyperlipidemia Father   . Heart disease Paternal Grandfather        Objective:    BP 102/66 (BP Location: Right Arm, Patient Position: Sitting, Cuff Size: Small)  Pulse 80   Temp 98.9 F (37.2 C) (Oral)   Resp 18   Ht 5' 7.5" (1.715 m)   Wt 157 lb (71.2 kg)   SpO2 99%   BMI 24.23 kg/m  Physical Exam  Constitutional: She is oriented to person, place, and time. She appears well-developed and well-nourished. No distress.  HENT:  Head: Normocephalic and atraumatic.  Right Ear: External ear normal.  Left Ear: External ear normal.  Nose: Nose normal.  Mouth/Throat: Oropharynx is clear and moist.  Eyes: Conjunctivae and EOM are normal. Pupils are equal, round, and reactive to light.  Neck: Normal  range of motion. Neck supple. Carotid bruit is not present. No thyromegaly present.  Cardiovascular: Normal rate, regular rhythm, normal heart sounds and intact distal pulses.  Exam reveals no gallop and no friction rub.   No murmur heard. Pulmonary/Chest: Effort normal and breath sounds normal. No respiratory distress. She has no wheezes. She has no rales.  Abdominal: Soft. Bowel sounds are normal. She exhibits no distension and no mass. There is no tenderness. There is no rebound and no guarding.  Musculoskeletal: Normal range of motion.  Lymphadenopathy:    She has no cervical adenopathy.  Neurological: She is alert and oriented to person, place, and time. No cranial nerve deficit. Coordination normal.  Skin: Skin is warm and dry. No rash noted. She is not diaphoretic. No erythema. No pallor.  +subcutaneous cystic lesion mobile and non-tender without erythema or induration.  Not consistent with lymph node.  Psychiatric: She has a normal mood and affect. Her behavior is normal.  Nursing note and vitals reviewed.  Results for orders placed or performed during the hospital encounter of 01/04/16  CBC with Differential/Platelet  Result Value Ref Range   WBC 7.1 4.0 - 10.5 K/uL   RBC 4.22 3.87 - 5.11 MIL/uL   Hemoglobin 13.1 12.0 - 15.0 g/dL   HCT 45.4 09.8 - 11.9 %   MCV 90.5 78.0 - 100.0 fL   MCH 31.0 26.0 - 34.0 pg   MCHC 34.3 30.0 - 36.0 g/dL   RDW 14.7 82.9 - 56.2 %   Platelets 229 150 - 400 K/uL   Neutrophils Relative % 45 %   Neutro Abs 3.2 1.7 - 7.7 K/uL   Lymphocytes Relative 47 %   Lymphs Abs 3.3 0.7 - 4.0 K/uL   Monocytes Relative 5 %   Monocytes Absolute 0.4 0.1 - 1.0 K/uL   Eosinophils Relative 2 %   Eosinophils Absolute 0.1 0.0 - 0.7 K/uL   Basophils Relative 1 %   Basophils Absolute 0.1 0.0 - 0.1 K/uL       Assessment & Plan:   1. Folliculitis   2. ADD (attention deficit disorder)   3. Generalized anxiety disorder     No orders of the defined types were  placed in this encounter.  Meds ordered this encounter  Medications  . doxycycline (VIBRAMYCIN) 100 MG capsule    Sig: Take 1 capsule (100 mg total) by mouth 2 (two) times daily.    Dispense:  14 capsule    Refill:  0  . DISCONTD: amphetamine-dextroamphetamine (ADDERALL) 20 MG tablet    Sig: Take 1 tablet (20 mg total) by mouth 2 (two) times daily.    Dispense:  60 tablet    Refill:  0  . DISCONTD: amphetamine-dextroamphetamine (ADDERALL) 20 MG tablet    Sig: Take 1 tablet (20 mg total) by mouth 2 (two) times daily. Please fill 60 days after prescribed.  Dispense:  60 tablet    Refill:  0  . amphetamine-dextroamphetamine (ADDERALL) 20 MG tablet    Sig: Take 1 tablet (20 mg total) by mouth 2 (two) times daily. Please fill 30 days after prescribed.    Dispense:  60 tablet    Refill:  0  . clonazePAM (KLONOPIN) 1 MG tablet    Sig: Take 1 tablet (1 mg total) by mouth daily as needed for anxiety.    Dispense:  30 tablet    Refill:  2    Return in about 6 months (around 11/12/2016) for recheck ADHD, anxiety.    I personally performed the services described in this documentation, which was scribed in my presence. The recorded information has been reviewed and considered.  Haidyn Kilburg Paulita Fujita, M.D. [facility] [address] [place], [place]  [address] [phone] phone [phone] fax

## 2016-05-16 ENCOUNTER — Ambulatory Visit: Payer: BLUE CROSS/BLUE SHIELD | Admitting: Nurse Practitioner

## 2016-08-15 ENCOUNTER — Encounter: Payer: Self-pay | Admitting: Family Medicine

## 2016-08-21 NOTE — Telephone Encounter (Signed)
Patient called in stating that she needs a 3 month supply on her Adderall.  Her call back number is [phone]

## 2016-08-26 ENCOUNTER — Ambulatory Visit (INDEPENDENT_AMBULATORY_CARE_PROVIDER_SITE_OTHER): Payer: BLUE CROSS/BLUE SHIELD | Admitting: Family Medicine

## 2016-08-26 ENCOUNTER — Encounter: Payer: Self-pay | Admitting: Family Medicine

## 2016-08-26 VITALS — BP 132/64 | HR 113 | Temp 97.7°F | Resp 16 | Ht 67.0 in | Wt 152.0 lb

## 2016-08-26 DIAGNOSIS — F411 Generalized anxiety disorder: Secondary | ICD-10-CM | POA: Diagnosis not present

## 2016-08-26 DIAGNOSIS — F902 Attention-deficit hyperactivity disorder, combined type: Secondary | ICD-10-CM | POA: Diagnosis not present

## 2016-08-26 MED ORDER — AMPHETAMINE-DEXTROAMPHETAMINE 20 MG PO TABS: 20.0000 mg | ORAL_TABLET | Freq: Three times a day (TID) | ORAL | 0 refills | Status: DC

## 2016-08-26 NOTE — Patient Instructions (Signed)
     IF you received an x-ray today, you will receive an invoice from [facility]. Please contact [facility] at [phone] with questions or concerns regarding your invoice.   IF you received labwork today, you will receive an invoice from [facility]. Please contact [facility] at [phone] with questions or concerns regarding your invoice.   Our billing staff will not be able to assist you with questions regarding bills from these companies.  You will be contacted with the lab results as soon as they are available. The fastest way to get your results is to activate your My Chart account. Instructions are located on the last page of this paperwork. If you have not heard from us regarding the results in 2 weeks, please contact this office.      

## 2016-08-26 NOTE — Progress Notes (Signed)
Subjective:    Patient ID: [name] [name], female    DOB: 28-Mar-1991, 25 y.o.   MRN: [id number]  08/26/2016  Medication Check (Adderall)   HPI This 25 y.o. female presents for four month follow-up of ADHD and anxiety.  Doing well.  [name] left the hospital; daughter is doing well. Trying to get into graduate school. Also helping aunt's congressional campaign.   Overall doing well.  Seven months old and [age] years old.  Youngest is really chilled out.  Wanting to add a third dose of Adderall.  In college, took way more of Adderall in general. Classes after kids go to bed.  Also, interested in taking Trazodone again.   Chronic insomnia; less of a thing; not much of an issue.  Would take Klonopin to calm down at night. Seemed calm with Trazodone than with Klonopin; definitely had insomnia with the children.  Husband covers the night shift. Wakes up at 5:00am; goes to bed at 9:00pm.  Taking 5:00am; would take it at 10:00am; 3:00pm.  Husband in law school.    Search of the [place] Controlled Substance Registry: no fills in six months.   [name], changed 10/08/1989    BP Readings from Last 3 Encounters:  08/26/16 132/64  05/15/16 102/66  05/13/16 130/88   Wt Readings from Last 3 Encounters:  08/26/16 152 lb (68.9 kg)  05/15/16 157 lb (71.2 kg)  05/13/16 155 lb (70.3 kg)   Immunization History  Administered Date(s) Administered  . Influenza,inj,Quad PF,36+ Mos 05/13/2016  . Influenza-Unspecified 06/15/2014  . Tdap 06/15/2014    Review of Systems  Constitutional: Negative for chills, diaphoresis, fatigue and fever.  Eyes: Negative for visual disturbance.  Respiratory: Negative for cough and shortness of breath.   Cardiovascular: Negative for chest pain, palpitations and leg swelling.  Gastrointestinal: Negative for abdominal pain, constipation, diarrhea, nausea and vomiting.  Endocrine: Negative for cold intolerance, heat intolerance, polydipsia, polyphagia and  polyuria.  Neurological: Negative for dizziness, tremors, seizures, syncope, facial asymmetry, speech difficulty, weakness, light-headedness, numbness and headaches.  Psychiatric/Behavioral: Positive for decreased concentration and sleep disturbance. Negative for dysphoric mood, self-injury and suicidal ideas. The patient is nervous/anxious.     Past Medical History:  Diagnosis Date  . ADHD (attention deficit hyperactivity disorder)   . Anxiety   . Panic attacks   . Vaginal Pap smear, abnormal    Past Surgical History:  Procedure Laterality Date  . DILATION AND CURETTAGE OF UTERUS  01/04/16  . DILATION AND EVACUATION N/A 01/04/2016   Procedure: DILATATION AND EVACUATION;  Surgeon: Candice Campavid Lowe, MD;  Location: [facility];  Service: Gynecology;  Laterality: N/A;  . INDUCED ABORTION    . wisdom teeth removal Bilateral    2010   Allergies  Allergen Reactions  . Ceclor [Cefaclor] Hives    Age 54 mild rash   Current Outpatient Prescriptions  Medication Sig Dispense Refill  . amphetamine-dextroamphetamine (ADDERALL) 20 MG tablet Take 1 tablet (20 mg total) by mouth 3 (three) times daily. 90 tablet 0  . clonazePAM (KLONOPIN) 1 MG tablet Take 1 tablet (1 mg total) by mouth daily as needed for anxiety. 30 tablet 2  . clonazePAM (KLONOPIN) 1 MG tablet Take 1 mg by mouth 2 (two) times daily.     No current facility-administered medications for this visit.    Social History   Social History  . Marital status: Married    Spouse name: N/A  . Number of children: N/A  . Years  of education: N/A   Occupational History  . Not on file.   Social History Main Topics  . Smoking status: Former Smoker    Types: Cigarettes    Quit date: 08/15/2013  . Smokeless tobacco: Never Used  . Alcohol use No     Comment: socially, not with preg  . Drug use: No  . Sexual activity: Not Currently    Birth control/ protection: None   Other Topics Concern  . Not on file   Social History Narrative  . No  narrative on file   Family History  Problem Relation Age of Onset  . Hypertension Mother   . Heart disease Father   . Cancer Father     testicular  . Hypertension Father   . Hyperlipidemia Father   . Heart disease Paternal Grandfather        Objective:    BP 132/64 (BP Location: Left Arm, Patient Position: Sitting, Cuff Size: Normal)   Pulse (!) 113   Temp 97.7 F (36.5 C)   Resp 16   Ht 5\' 7"  (1.702 m)   Wt 152 lb (68.9 kg)   SpO2 97%   BMI 23.81 kg/m  Physical Exam  Constitutional: She is oriented to person, place, and time. She appears well-developed and well-nourished. No distress.  HENT:  Head: Normocephalic and atraumatic.  Right Ear: External ear normal.  Left Ear: External ear normal.  Nose: Nose normal.  Mouth/Throat: Oropharynx is clear and moist.  Eyes: Conjunctivae and EOM are normal. Pupils are equal, round, and reactive to light.  Neck: Normal range of motion. Neck supple. Carotid bruit is not present. No thyromegaly present.  Cardiovascular: Normal rate, regular rhythm, normal heart sounds and intact distal pulses.  Exam reveals no gallop and no friction rub.   No murmur heard. Pulmonary/Chest: Effort normal and breath sounds normal. She has no wheezes. She has no rales.  Abdominal: Soft. Bowel sounds are normal. She exhibits no distension and no mass. There is no tenderness. There is no rebound and no guarding.  Lymphadenopathy:    She has no cervical adenopathy.  Neurological: She is alert and oriented to person, place, and time. No cranial nerve deficit.  Skin: Skin is warm and dry. No rash noted. She is not diaphoretic. No erythema. No pallor.  Psychiatric: She has a normal mood and affect. Her behavior is normal.   Results for orders placed or performed during the hospital encounter of 01/04/16  CBC with Differential/Platelet  Result Value Ref Range   WBC 7.1 4.0 - 10.5 K/uL   RBC 4.22 3.87 - 5.11 MIL/uL   Hemoglobin 13.1 12.0 - 15.0 g/dL   HCT  40.9 81.1 - 91.4 %   MCV 90.5 78.0 - 100.0 fL   MCH 31.0 26.0 - 34.0 pg   MCHC 34.3 30.0 - 36.0 g/dL   RDW 78.2 95.6 - 21.3 %   Platelets 229 150 - 400 K/uL   Neutrophils Relative % 45 %   Neutro Abs 3.2 1.7 - 7.7 K/uL   Lymphocytes Relative 47 %   Lymphs Abs 3.3 0.7 - 4.0 K/uL   Monocytes Relative 5 %   Monocytes Absolute 0.4 0.1 - 1.0 K/uL   Eosinophils Relative 2 %   Eosinophils Absolute 0.1 0.0 - 0.7 K/uL   Basophils Relative 1 %   Basophils Absolute 0.1 0.0 - 0.1 K/uL       Assessment & Plan:   1. Attention deficit hyperactivity disorder (ADHD), combined type  2. GAD (generalized anxiety disorder)    -agreeable to increasing Adderall 20mg  to tid dosing yet advised minimal benefit to doses greater than 40mg ; usually may suffer with further side effects. -now that stressors from daughter have improved and anxiety has improved, recommend avoiding clonazepam.  Recommend exercise, avoiding caffeine. Pt agreeable. -face to face counseling for 25 minutes with greater than 50% of time dedicated to counseling and coordination of care.   No orders of the defined types were placed in this encounter.  Meds ordered this encounter  Medications  . DISCONTD: amphetamine-dextroamphetamine (ADDERALL) 20 MG tablet    Sig: Take 1 tablet (20 mg total) by mouth 3 (three) times daily.    Dispense:  90 tablet    Refill:  0  . DISCONTD: amphetamine-dextroamphetamine (ADDERALL) 20 MG tablet    Sig: Take 1 tablet (20 mg total) by mouth 3 (three) times daily.    Dispense:  90 tablet    Refill:  0    Fill 30 days after prescribed  . DISCONTD: amphetamine-dextroamphetamine (ADDERALL) 20 MG tablet    Sig: Take 1 tablet (20 mg total) by mouth 3 (three) times daily.    Dispense:  90 tablet    Refill:  0    Fill 60 days after prescribed  . amphetamine-dextroamphetamine (ADDERALL) 20 MG tablet    Sig: Take 1 tablet (20 mg total) by mouth 3 (three) times daily.    Dispense:  90 tablet    Refill:   0    Return in about 3 months (around 11/24/2016) for recheck.   Jeydan Barner Paulita FujitaMartin Verdelle Valtierra, M.D. [facility]  [place] [address] [place], [place]  [id number] [phone] phone [phone] fax

## 2016-08-27 ENCOUNTER — Ambulatory Visit: Payer: Self-pay | Admitting: Family Medicine

## 2016-10-23 ENCOUNTER — Telehealth: Payer: Self-pay

## 2016-10-23 NOTE — Telephone Encounter (Signed)
Need PA on add med for quant limit  [phone] Id [id number]

## 2016-11-05 NOTE — Telephone Encounter (Signed)
Sent to plan via cover my meds. Key code is [id number].  Waiting for response.

## 2016-11-11 NOTE — Telephone Encounter (Signed)
LMVM for patient to call us back if she has any additional medication coverage.

## 2016-11-11 NOTE — Telephone Encounter (Signed)
Request was cancelled by cover my meds on 11/05/2016 stating that patient does not have active med coverage with them.

## 2016-11-12 NOTE — Telephone Encounter (Signed)
Called [facility] and got PA for Adderall 20mg  #90.  PA is authorized until 11/13/2019.  Patient aware.

## 2016-11-12 NOTE — Telephone Encounter (Signed)
Patient's husband called back with additional insurance information. [facility] [phone] member ID [id number] Primary card holder is Herschell Dimesichard J. Macinnes.  Patient is a dependant on the card.  I will call now to see if I can initiate PA.

## 2016-11-18 ENCOUNTER — Ambulatory Visit (INDEPENDENT_AMBULATORY_CARE_PROVIDER_SITE_OTHER): Payer: BLUE CROSS/BLUE SHIELD | Admitting: Family Medicine

## 2016-11-18 VITALS — BP 128/80 | HR 85 | Temp 98.0°F | Resp 16 | Ht 67.0 in | Wt 145.0 lb

## 2016-11-18 DIAGNOSIS — F902 Attention-deficit hyperactivity disorder, combined type: Secondary | ICD-10-CM | POA: Diagnosis not present

## 2016-11-18 DIAGNOSIS — F411 Generalized anxiety disorder: Secondary | ICD-10-CM | POA: Diagnosis not present

## 2016-11-18 DIAGNOSIS — F5104 Psychophysiologic insomnia: Secondary | ICD-10-CM | POA: Diagnosis not present

## 2016-11-18 MED ORDER — AMPHETAMINE-DEXTROAMPHETAMINE 20 MG PO TABS: 20.0000 mg | ORAL_TABLET | Freq: Three times a day (TID) | ORAL | 0 refills | Status: DC

## 2016-11-18 MED ORDER — TRAZODONE HCL 50 MG PO TABS: 25.0000 mg | ORAL_TABLET | Freq: Every day | ORAL | 3 refills | Status: DC

## 2016-11-18 NOTE — Patient Instructions (Signed)
     IF you received an x-ray today, you will receive an invoice from [facility]. Please contact [facility] at [phone] with questions or concerns regarding your invoice.   IF you received labwork today, you will receive an invoice from [facility]. Please contact [facility] at [phone] with questions or concerns regarding your invoice.   Our billing staff will not be able to assist you with questions regarding bills from these companies.  You will be contacted with the lab results as soon as they are available. The fastest way to get your results is to activate your My Chart account. Instructions are located on the last page of this paperwork. If you have not heard from us regarding the results in 2 weeks, please contact this office.     

## 2016-11-18 NOTE — Progress Notes (Signed)
Subjective:    Patient ID: [name] [name], female    DOB: 10-03-1990, 25 y.o.   MRN: [id number]  11/18/2016  Follow-up (pt want to get back on trazdone) and Medication Refill (adderall)   HPI This 26 y.o. female presents for evaluation of ADD; increased Adderall to 20mg  tid at last visit.  Now has baby sleeping through the night.  Daughter has ENT appointment this morning; husband took daughter.  Applied to get into nursing school; GPA too low; plans to take a couple of more classes to improve GPA.  Lupe Carney Shay/[place] [facility] for talk therapy. Appointment on 11-30-16 for counseling; Caralyn Guile recommend her; family friend; in charge of [place].  Has been a while with talk therapy; last experience was not a good match.  Has time ot do it.  Youngest ten months old; both in school until 12:00pm.  Taking classes on line through [employer] prerequisite courses for nursing.  Getting gum surgery in April.    Now that daughter is sleeping through the night, would like to take Trazodone; seemed to help with anxiety.  Both sisters take it and love it.  No insomnia with Adderall.  Can still go to sleep after taking it.  Trazodone has seemed to help in the past with anxiety; providers could take up to four of them or could just take one.  Did not ned any clonazepam when taking Trazodone.    10/25/16 Adderall 20mg  90 [facility] 10/25/16 Adderall 20mg  [address] 2/8 Diazepam 10mg  6 Lutins DDS  Current schedule,  Going to bed super early 8:45pm Gets up at 5:15 10:ooam 2:00pm Has three more hours break.   Husband does dinner and bedtime.   Immunization History  Administered Date(s) Administered  . Influenza,inj,Quad PF,36+ Mos 05/13/2016  . Influenza-Unspecified 06/15/2014  . Tdap 06/15/2014   BP Readings from Last 3 Encounters:  11/18/16 128/80  08/26/16 132/64  05/15/16 102/66   Wt Readings from Last 3 Encounters:  11/18/16 145 lb (65.8 kg)  08/26/16 152 lb (68.9 kg)  05/15/16  157 lb (71.2 kg)    Review of Systems  Constitutional: Negative for chills, diaphoresis, fatigue and fever.  Eyes: Negative for visual disturbance.  Respiratory: Negative for cough and shortness of breath.   Cardiovascular: Negative for chest pain, palpitations and leg swelling.  Gastrointestinal: Negative for abdominal pain, constipation, diarrhea, nausea and vomiting.  Endocrine: Negative for cold intolerance, heat intolerance, polydipsia, polyphagia and polyuria.  Neurological: Negative for dizziness, tremors, seizures, syncope, facial asymmetry, speech difficulty, weakness, light-headedness, numbness and headaches.  Psychiatric/Behavioral: Positive for decreased concentration and sleep disturbance. Negative for self-injury and suicidal ideas. The patient is nervous/anxious.     Past Medical History:  Diagnosis Date  . ADHD (attention deficit hyperactivity disorder)   . Anxiety   . Panic attacks   . Vaginal Pap smear, abnormal    Past Surgical History:  Procedure Laterality Date  . DILATION AND CURETTAGE OF UTERUS  01/04/16  . DILATION AND EVACUATION N/A 01/04/2016   Procedure: DILATATION AND EVACUATION;  Surgeon: Candice Camp, MD;  Location: [facility];  Service: Gynecology;  Laterality: N/A;  . INDUCED ABORTION    . wisdom teeth removal Bilateral    2010   Allergies  Allergen Reactions  . Ceclor [Cefaclor] Hives    Age 67 mild rash    Social History   Social History  . Marital status: Married    Spouse name: N/A  . Number of children: N/A  .  Years of education: N/A   Occupational History  . Not on file.   Social History Main Topics  . Smoking status: Former Smoker    Types: Cigarettes    Quit date: 08/15/2013  . Smokeless tobacco: Never Used  . Alcohol use No     Comment: socially, not with preg  . Drug use: No  . Sexual activity: Not Currently    Birth control/ protection: None   Other Topics Concern  . Not on file   Social History Narrative  . No narrative  on file   Family History  Problem Relation Age of Onset  . Hypertension Mother   . Heart disease Father   . Cancer Father     testicular  . Hypertension Father   . Hyperlipidemia Father   . Heart disease Paternal Grandfather        Objective:    BP 128/80   Pulse 85   Temp 98 F (36.7 C) (Oral)   Resp 16   Ht 5\' 7"  (1.702 m)   Wt 145 lb (65.8 kg)   SpO2 100%   BMI 22.71 kg/m  Physical Exam  Constitutional: She is oriented to person, place, and time. She appears well-developed and well-nourished. No distress.  HENT:  Head: Normocephalic and atraumatic.  Right Ear: External ear normal.  Left Ear: External ear normal.  Nose: Nose normal.  Mouth/Throat: Oropharynx is clear and moist.  Eyes: Conjunctivae and EOM are normal. Pupils are equal, round, and reactive to light.  Neck: Normal range of motion. Neck supple. Carotid bruit is not present. No thyromegaly present.  Cardiovascular: Normal rate, regular rhythm, normal heart sounds and intact distal pulses.  Exam reveals no gallop and no friction rub.   No murmur heard. Pulmonary/Chest: Effort normal and breath sounds normal. She has no wheezes. She has no rales.  Abdominal: Soft. Bowel sounds are normal. She exhibits no distension and no mass. There is no tenderness. There is no rebound and no guarding.  Lymphadenopathy:    She has no cervical adenopathy.  Neurological: She is alert and oriented to person, place, and time. No cranial nerve deficit.  Skin: Skin is warm and dry. No rash noted. She is not diaphoretic. No erythema. No pallor.  Psychiatric: She has a normal mood and affect. Her behavior is normal.   Depression screen [facility] 2/[facility] 11/18/2016 08/26/2016 05/15/2016 05/13/2016 03/11/2016  Decreased Interest 0 0 0 0 0  Down, Depressed, Hopeless 0 0 0 0 0  PHQ - 2 Score 0 0 0 0 0        Assessment & Plan:   1. GAD (generalized anxiety disorder)   2. Attention deficit hyperactivity disorder (ADHD), combined type   3.  Psychophysiological insomnia    -stable; anxiety has improved with improvement of daughter's acute health issues. -doing well with tid Adderall; refills provided. -rx for Trazodone provided; pt has weaned off of Klonopin at this time but would like Trazodone to help with sleep when needed. -starting psychotherapy; also starting regular exercise regimen for stress management with noticeable improvement in stress/anxiety. -30 minutes face-to face encounter with greater than 50% of time dedicated to counseling and coordination of care.   No orders of the defined types were placed in this encounter.  Meds ordered this encounter  Medications  . traZODone (DESYREL) 50 MG tablet    Sig: Take 0.5-1 tablets (25-50 mg total) by mouth at bedtime.    Dispense:  30 tablet    Refill:  3  . DISCONTD: amphetamine-dextroamphetamine (ADDERALL) 20 MG tablet    Sig: Take 1 tablet (20 mg total) by mouth 3 (three) times daily.    Dispense:  90 tablet    Refill:  0  . DISCONTD: amphetamine-dextroamphetamine (ADDERALL) 20 MG tablet    Sig: Take 1 tablet (20 mg total) by mouth 3 (three) times daily.    Dispense:  90 tablet    Refill:  0    DO NOT FILL FOR 30 DAYS AFTER PRESCRIBED  . amphetamine-dextroamphetamine (ADDERALL) 20 MG tablet    Sig: Take 1 tablet (20 mg total) by mouth 3 (three) times daily.    Dispense:  90 tablet    Refill:  0    DO NOT FILL FOR 60 DAYS AFTER PRESCRIBED    Return in about 3 months (around 02/18/2017) for recheck.   Aalyah Mansouri Paulita Fujita, M.D. Primary [facility] at [facility] previously [facility] [address] [place], [place]  [address] [phone] phone [phone] fax

## 2016-11-21 ENCOUNTER — Ambulatory Visit: Payer: BLUE CROSS/BLUE SHIELD | Admitting: Psychology

## 2016-11-24 ENCOUNTER — Ambulatory Visit: Payer: Self-pay | Admitting: Psychology

## 2016-12-01 ENCOUNTER — Ambulatory Visit: Payer: BLUE CROSS/BLUE SHIELD | Admitting: Psychology

## 2016-12-01 ENCOUNTER — Ambulatory Visit: Payer: Self-pay | Admitting: Psychology

## 2016-12-09 ENCOUNTER — Encounter: Payer: Self-pay | Admitting: Family Medicine

## 2016-12-09 ENCOUNTER — Ambulatory Visit: Payer: Self-pay | Admitting: Family Medicine

## 2016-12-09 DIAGNOSIS — G4700 Insomnia, unspecified: Secondary | ICD-10-CM | POA: Insufficient documentation

## 2016-12-18 ENCOUNTER — Ambulatory Visit: Payer: Self-pay | Admitting: Psychology

## 2017-01-13 HISTORY — PX: GUM SURGERY: SHX658

## 2017-01-30 ENCOUNTER — Ambulatory Visit (INDEPENDENT_AMBULATORY_CARE_PROVIDER_SITE_OTHER): Payer: BLUE CROSS/BLUE SHIELD | Admitting: Psychology

## 2017-01-30 DIAGNOSIS — F411 Generalized anxiety disorder: Secondary | ICD-10-CM | POA: Diagnosis not present

## 2017-02-06 ENCOUNTER — Encounter: Payer: Self-pay | Admitting: Family Medicine

## 2017-02-06 ENCOUNTER — Ambulatory Visit (INDEPENDENT_AMBULATORY_CARE_PROVIDER_SITE_OTHER): Payer: BLUE CROSS/BLUE SHIELD | Admitting: Family Medicine

## 2017-02-06 VITALS — BP 122/85 | HR 82 | Temp 97.8°F | Resp 17 | Ht 68.5 in | Wt 139.0 lb

## 2017-02-06 DIAGNOSIS — F5104 Psychophysiologic insomnia: Secondary | ICD-10-CM

## 2017-02-06 DIAGNOSIS — F902 Attention-deficit hyperactivity disorder, combined type: Secondary | ICD-10-CM | POA: Diagnosis not present

## 2017-02-06 DIAGNOSIS — J069 Acute upper respiratory infection, unspecified: Secondary | ICD-10-CM

## 2017-02-06 DIAGNOSIS — F411 Generalized anxiety disorder: Secondary | ICD-10-CM

## 2017-02-06 DIAGNOSIS — J301 Allergic rhinitis due to pollen: Secondary | ICD-10-CM | POA: Diagnosis not present

## 2017-02-06 MED ORDER — AMPHETAMINE-DEXTROAMPHETAMINE 20 MG PO TABS: 20.0000 mg | ORAL_TABLET | Freq: Three times a day (TID) | ORAL | 0 refills | Status: DC

## 2017-02-06 NOTE — Patient Instructions (Addendum)
  Recommend Flonase nasal spray and Mucinex for cough.   IF you received an x-ray today, you will receive an invoice from [facility]. Please contact [facility] at [phone] with questions or concerns regarding your invoice.   IF you received labwork today, you will receive an invoice from [facility]. Please contact [facility] at [phone] with questions or concerns regarding your invoice.   Our billing staff will not be able to assist you with questions regarding bills from these companies.  You will be contacted with the lab results as soon as they are available. The fastest way to get your results is to activate your My Chart account. Instructions are located on the last page of this paperwork. If you have not heard from us regarding the results in 2 weeks, please contact this office.

## 2017-02-06 NOTE — Progress Notes (Signed)
Subjective:    Patient ID: [name] [name], female    DOB: 12/23/1990, 26 y.o.   MRN: [id number]  02/06/2017  Follow-up (medication ); Anxiety; ADHD; and Insomnia   HPI This 26 y.o. female presents for evaluation of anxiety, ADD, insomnia.  Seeing  [facility] at [facility].  Tiny baby got MRSA badly; had to undergo admission for one week.  Facial MRSA.  Started as a small pimple and then spread rapidly.  Likes new therapist.  Guiding patient on how to handle situations.  Kids are about to be out for school.  Still taking online classes; did not get into nursing school; reapplying.  Trying to get part-time job; infant can go to school.  Going to [facility] for counseling.   No fever/chills/sweats.  Ear pain B; +ST.  +rhinorrhea; +nasal congestion.  +cough.  Feels horrible.  Children with cough and similar symptoms.  +PND.  No n/v/d.  +achy in shoulders. Onset for two days. Similar symptoms prior to surgery.  +sneezing.  No medications for allergies.   Both daughters have also been sick, so not sure if allergy related or due to acute illness.  S/p gum surgery; so miserable.  So mad due to pain.  No mouth pain.     Immunization History  Administered Date(s) Administered  . Influenza,inj,Quad PF,36+ Mos 05/13/2016  . Influenza-Unspecified 06/15/2014  . Tdap 06/15/2014    Review of Systems  Constitutional: Positive for fatigue. Negative for chills, diaphoresis and fever.  HENT: Positive for congestion, ear pain, postnasal drip, rhinorrhea and sore throat.   Eyes: Negative for visual disturbance.  Respiratory: Positive for cough. Negative for shortness of breath.   Cardiovascular: Negative for chest pain, palpitations and leg swelling.  Gastrointestinal: Negative for abdominal pain, constipation, diarrhea, nausea and vomiting.  Endocrine: Negative for cold intolerance, heat intolerance, polydipsia, polyphagia and polyuria.  Neurological: Negative for dizziness, tremors, seizures, syncope,  facial asymmetry, speech difficulty, weakness, light-headedness, numbness and headaches.  Psychiatric/Behavioral: Positive for decreased concentration and sleep disturbance. Negative for agitation, dysphoric mood, self-injury and suicidal ideas. The patient is nervous/anxious.     Past Medical History:  Diagnosis Date  . ADHD (attention deficit hyperactivity disorder)   . Anxiety   . Panic attacks   . Vaginal Pap smear, abnormal    Past Surgical History:  Procedure Laterality Date  . DILATION AND CURETTAGE OF UTERUS  01/04/16  . DILATION AND EVACUATION N/A 01/04/2016   Procedure: DILATATION AND EVACUATION;  Surgeon: Candice Campavid Lowe, MD;  Location: [facility];  Service: Gynecology;  Laterality: N/A;  . INDUCED ABORTION    . wisdom teeth removal Bilateral    2010   Allergies  Allergen Reactions  . Ceclor [Cefaclor] Hives    Age 26 mild rash    Social History   Social History  . Marital status: Married    Spouse name: N/A  . Number of children: N/A  . Years of education: N/A   Occupational History  . Not on file.   Social History Main Topics  . Smoking status: Former Smoker    Types: Cigarettes    Quit date: 08/15/2013  . Smokeless tobacco: Never Used  . Alcohol use No     Comment: socially, not with preg  . Drug use: No  . Sexual activity: Not Currently    Birth control/ protection: None   Other Topics Concern  . Not on file   Social History Narrative  . No narrative on file   Family History  Problem Relation Age of Onset  . Hypertension Mother   . Heart disease Father   . Cancer Father        testicular  . Hypertension Father   . Hyperlipidemia Father   . Heart disease Paternal Grandfather        Objective:    BP 122/85   Pulse 82   Temp 97.8 F (36.6 C) (Oral)   Resp 17   Ht 5' 8.5" (1.74 m)   Wt 139 lb (63 kg)   SpO2 98%   BMI 20.83 kg/m  Physical Exam  Constitutional: She is oriented to person, place, and time. She appears well-developed and  well-nourished. No distress.  HENT:  Head: Normocephalic and atraumatic.  Right Ear: External ear normal.  Left Ear: External ear normal.  Nose: Nose normal.  Mouth/Throat: Oropharynx is clear and moist.  Eyes: Conjunctivae and EOM are normal. Pupils are equal, round, and reactive to light.  Neck: Normal range of motion. Neck supple. Carotid bruit is not present. No thyromegaly present.  Cardiovascular: Normal rate, regular rhythm, normal heart sounds and intact distal pulses.  Exam reveals no gallop and no friction rub.   No murmur heard. Pulmonary/Chest: Effort normal and breath sounds normal. She has no wheezes. She has no rales.  Abdominal: Soft. Bowel sounds are normal. She exhibits no distension and no mass. There is no tenderness. There is no rebound and no guarding.  Lymphadenopathy:    She has no cervical adenopathy.  Neurological: She is alert and oriented to person, place, and time. No cranial nerve deficit.  Skin: Skin is warm and dry. No rash noted. She is not diaphoretic. No erythema. No pallor.  Psychiatric: She has a normal mood and affect. Her behavior is normal. Judgment and thought content normal.        Assessment & Plan:   1. Attention deficit hyperactivity disorder (ADHD), combined type   2. GAD (generalized anxiety disorder)   3. Psychophysiological insomnia   4. Seasonal allergic rhinitis due to pollen   5. Acute upper respiratory infection    -controlled ADHD; refills x 3 provided; RTC six months for follow-up; call in 3 months for refills. -anxiety moderately controlled at this time with psychotherapy.  Continue regular exercise for stress management. -continue Trazodone for insomnia. -acute illness currently. Recommend rest, fluids, Ibuprofen,Mucinex.   -also suffers with allergic rhinitis; advised to initiate nasal steroid and oral antihistamine.  No orders of the defined types were placed in this encounter.  Meds ordered this encounter  Medications    . DISCONTD: amphetamine-dextroamphetamine (ADDERALL) 20 MG tablet    Sig: Take 1 tablet (20 mg total) by mouth 3 (three) times daily.    Dispense:  90 tablet    Refill:  0    DO NOT FILL FOR 60 DAYS AFTER PRESCRIBED  . DISCONTD: amphetamine-dextroamphetamine (ADDERALL) 20 MG tablet    Sig: Take 1 tablet (20 mg total) by mouth 3 (three) times daily.    Dispense:  90 tablet    Refill:  0    DO NOT FILL FOR 30 DAYS AFTER PRESCRIBED  . amphetamine-dextroamphetamine (ADDERALL) 20 MG tablet    Sig: Take 1 tablet (20 mg total) by mouth 3 (three) times daily.    Dispense:  90 tablet    Refill:  0    Return in about 6 months (around 08/09/2017) for recheck ADHD.   Yidel Teuscher Paulita Fujita, M.D. Primary Care at [facility] [address] [place], [place]  [address] [phone] phone [phone] fax

## 2017-02-11 ENCOUNTER — Ambulatory Visit: Payer: Self-pay | Admitting: Family Medicine

## 2017-02-12 ENCOUNTER — Ambulatory Visit: Payer: Self-pay | Admitting: Psychology

## 2017-02-19 ENCOUNTER — Encounter: Payer: Self-pay | Admitting: Family Medicine

## 2017-02-20 ENCOUNTER — Ambulatory Visit (INDEPENDENT_AMBULATORY_CARE_PROVIDER_SITE_OTHER): Payer: BLUE CROSS/BLUE SHIELD | Admitting: Psychology

## 2017-02-20 DIAGNOSIS — F411 Generalized anxiety disorder: Secondary | ICD-10-CM | POA: Diagnosis not present

## 2017-02-24 ENCOUNTER — Ambulatory Visit (INDEPENDENT_AMBULATORY_CARE_PROVIDER_SITE_OTHER): Payer: BLUE CROSS/BLUE SHIELD | Admitting: Family Medicine

## 2017-02-24 ENCOUNTER — Ambulatory Visit (INDEPENDENT_AMBULATORY_CARE_PROVIDER_SITE_OTHER): Payer: BLUE CROSS/BLUE SHIELD

## 2017-02-24 ENCOUNTER — Encounter: Payer: Self-pay | Admitting: Family Medicine

## 2017-02-24 ENCOUNTER — Telehealth: Payer: Self-pay | Admitting: *Deleted

## 2017-02-24 VITALS — BP 104/68 | HR 111 | Temp 99.1°F | Resp 16 | Ht 67.0 in | Wt 135.0 lb

## 2017-02-24 DIAGNOSIS — R059 Cough, unspecified: Secondary | ICD-10-CM

## 2017-02-24 DIAGNOSIS — J181 Lobar pneumonia, unspecified organism: Secondary | ICD-10-CM | POA: Diagnosis not present

## 2017-02-24 DIAGNOSIS — J189 Pneumonia, unspecified organism: Secondary | ICD-10-CM

## 2017-02-24 DIAGNOSIS — R509 Fever, unspecified: Secondary | ICD-10-CM | POA: Diagnosis not present

## 2017-02-24 DIAGNOSIS — J9801 Acute bronchospasm: Secondary | ICD-10-CM | POA: Diagnosis not present

## 2017-02-24 DIAGNOSIS — R05 Cough: Secondary | ICD-10-CM

## 2017-02-24 IMAGING — DX DG CHEST 2V
2 series · 2 of 2 positions shown · non-contrast
Comparison: None in PACs

CLINICAL DATA: Cough, fever, abnormal chest exam in the right mid
lung.

EXAM:
CHEST  2 VIEW

[chest pa]
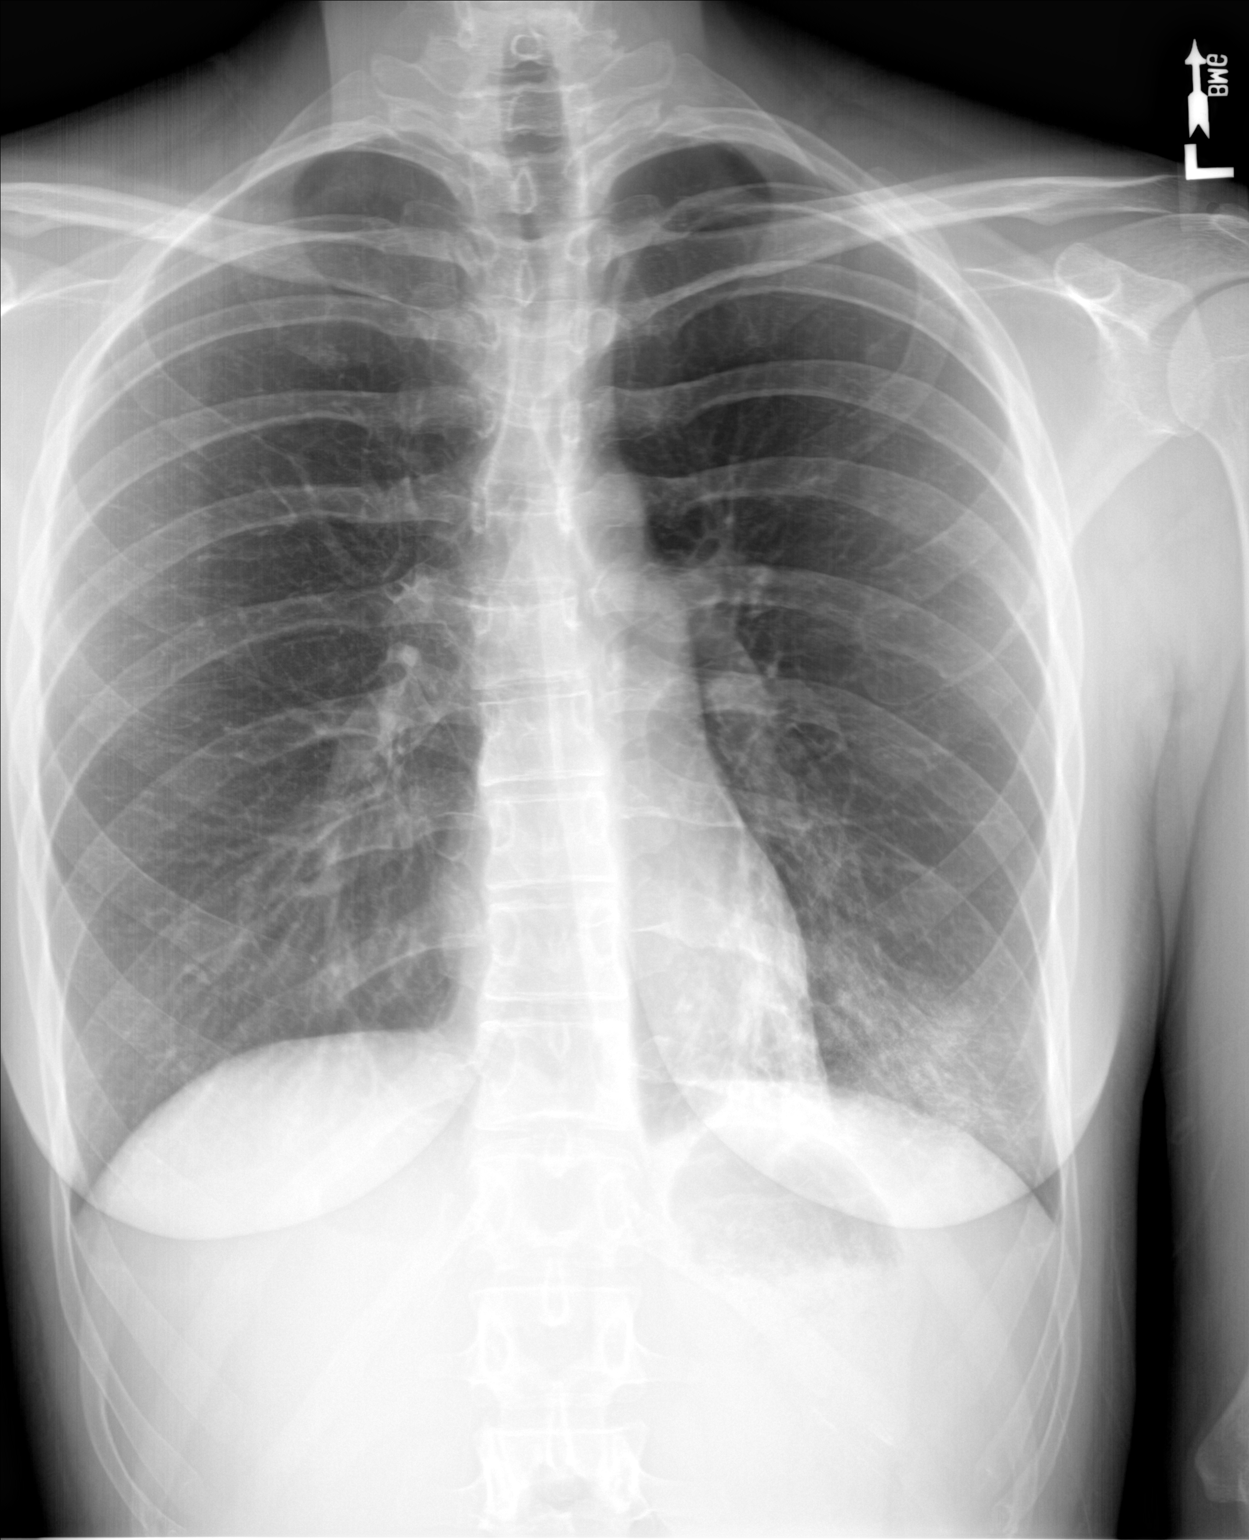

[chest lat]
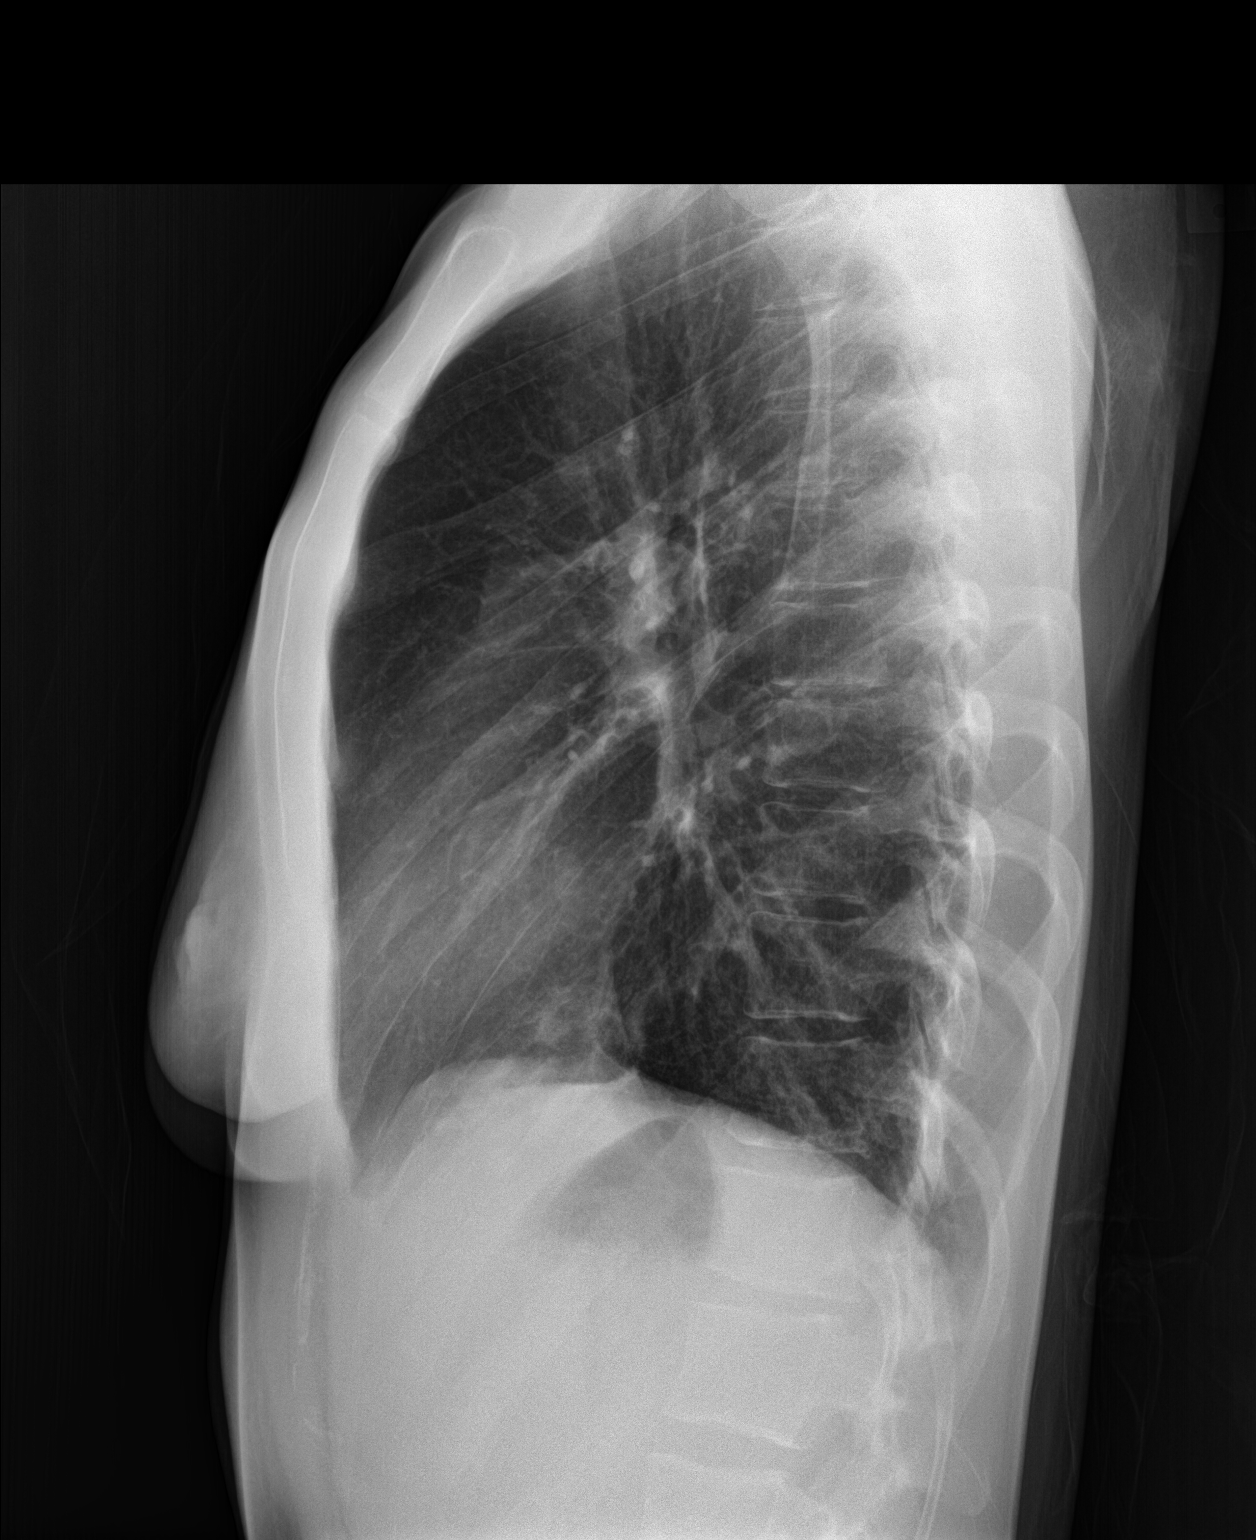

[2 of 2 positions shown; findings below may reference images not displayed]

FINDINGS: The lungs are adequately inflated. There is an infiltrate in the
lingula or anterior aspect of the left lower lobe. The right lobe is
clear. The heart and pulmonary vascularity are normal. There is no
pleural effusion. The bony structures are unremarkable.
IMPRESSION: Lingular or anterior left lower lobe pneumonia. Follow-up
radiographs are recommended if the patient's symptoms persist
following anticipated antibiotic therapy.

## 2017-02-24 MED ORDER — CLONAZEPAM 0.5 MG PO TABS: 0.2500 mg | ORAL_TABLET | Freq: Every day | ORAL | 0 refills | Status: DC | PRN

## 2017-02-24 MED ORDER — ALBUTEROL SULFATE HFA 108 (90 BASE) MCG/ACT IN AERS: 1.0000 | INHALATION_SPRAY | Freq: Four times a day (QID) | RESPIRATORY_TRACT | 0 refills | Status: DC | PRN

## 2017-02-24 MED ORDER — AZITHROMYCIN 250 MG PO TABS
ORAL_TABLET | ORAL | 0 refills | Status: DC
Start: 2017-02-24 — End: 2017-06-29

## 2017-02-24 NOTE — Telephone Encounter (Signed)
[facility].

## 2017-02-24 NOTE — Progress Notes (Signed)
Subjective:  By signing my name below, I, [name] [name], attest that this documentation has been prepared under the direction and in the presence of [name] FloodJeffrey R Angelina Venard, MD Electronically Signed: Charline BillsEssence [name], ED Scribe 02/24/2017 at 4:35 PM.   Patient ID: [name] [name], female    DOB: 1990-10-22, 26 y.o.   MRN: [id number]  Chief Complaint  Patient presents with  . Cough  . Chills   HPI [name] [name] is a 26 y.o. female who presents to Primary Care at [facility] complaining of dry cough for several weeks. Pt reports associated symptoms of fever with Tmax of 101 F in the mornings, chest pain only with coughing, wheezing, chills for the past 2 days, hoarseness for 1 week that has since resolved. She has taken ibuprofen without relief of fever. Pt states that her daughters ages 2 and 3 have been ill with similar symptoms. No personal h/o asthma but both of her daughters have asthma.   Patient Active Problem List   Diagnosis Date Noted  . Insomnia 12/09/2016  . ADHD (attention deficit hyperactivity disorder) 10/27/2014  . GAD (generalized anxiety disorder) 10/27/2014  . Other acne 05/03/2008  . Allergic rhinitis 01/27/2008   Past Medical History:  Diagnosis Date  . ADHD (attention deficit hyperactivity disorder)   . Anxiety   . Panic attacks   . Vaginal Pap smear, abnormal    Past Surgical History:  Procedure Laterality Date  . DILATION AND CURETTAGE OF UTERUS  01/04/16  . DILATION AND EVACUATION N/A 01/04/2016   Procedure: DILATATION AND EVACUATION;  Surgeon: Candice Campavid Lowe, MD;  Location: [facility];  Service: Gynecology;  Laterality: N/A;  . INDUCED ABORTION    . wisdom teeth removal Bilateral    2010   Allergies  Allergen Reactions  . Ceclor [Cefaclor] Hives    Age 15 mild rash   Prior to Admission medications   Medication Sig Start Date End Date Taking? Authorizing Provider  amphetamine-dextroamphetamine (ADDERALL) 20 MG tablet Take 1 tablet (20 mg total) by mouth 3 (three) times  daily. 02/06/17  Yes Ethelda ChickSmith, Kristi M, MD  traZODone (DESYREL) 50 MG tablet Take 0.5-1 tablets (25-50 mg total) by mouth at bedtime. 11/18/16  Yes Ethelda ChickSmith, Kristi M, MD   Social History   Social History  . Marital status: Married    Spouse name: N/A  . Number of children: N/A  . Years of education: N/A   Occupational History  . Not on file.   Social History Main Topics  . Smoking status: Former Smoker    Types: Cigarettes    Quit date: 08/15/2013  . Smokeless tobacco: Never Used  . Alcohol use No     Comment: socially, not with preg  . Drug use: No  . Sexual activity: Not Currently    Birth control/ protection: None   Other Topics Concern  . Not on file   Social History Narrative  . No narrative on file   Review of Systems  Constitutional: Positive for chills and fever.  HENT: Positive for voice change.   Respiratory: Positive for cough and wheezing.   Cardiovascular: Positive for chest pain (only with coughing).      Objective:   Physical Exam  Constitutional: She is oriented to person, place, and time. She appears well-developed and well-nourished. No distress.  HENT:  Head: Normocephalic and atraumatic.  Right Ear: Hearing, tympanic membrane, external ear and ear canal normal.  Left Ear: Hearing, tympanic membrane, external ear and ear canal normal.  Nose: Nose normal.  Mouth/Throat: Oropharynx is clear and moist. No oropharyngeal exudate.  Eyes: Conjunctivae and EOM are normal. Pupils are equal, round, and reactive to light.  Cardiovascular: Normal rate, regular rhythm, normal heart sounds and intact distal pulses.   No murmur heard. Pulmonary/Chest: Effort normal and breath sounds normal. No respiratory distress. She has no wheezes. She has no rhonchi.  Faint rhonchi, R middle lobe, L lower lobe.   Neurological: She is alert and oriented to person, place, and time.  Skin: Skin is warm and dry. No rash noted.  Psychiatric: She has a normal mood and affect. Her  behavior is normal.  Vitals reviewed.  Vitals:   02/24/17 1459  BP: 104/68  Pulse: (!) 111  Resp: 16  Temp: 99.1 F (37.3 C)  TempSrc: Oral  SpO2: 98%  Weight: 135 lb (61.2 kg)  Height: 5\' 7"  (1.702 m)   Dg Chest 2 View  Result Date: 02/24/2017 CLINICAL DATA:  Cough, fever, abnormal chest exam in the right mid lung. EXAM: CHEST  2 VIEW COMPARISON:  None in PACs FINDINGS: The lungs are adequately inflated. There is an infiltrate in the lingula or anterior aspect of the left lower lobe. The right lobe is clear. The heart and pulmonary vascularity are normal. There is no pleural effusion. The bony structures are unremarkable. IMPRESSION: Lingular or anterior left lower lobe pneumonia. Follow-up radiographs are recommended if the patient's symptoms persist following anticipated antibiotic therapy. Electronically Signed   By: David  [place] M.D.   On: 02/24/2017 17:09      Assessment & Plan:    [name] [name] is a 26 y.o. female Community acquired pneumonia of left lower lobe of lung (HCC) - Plan: DG Chest 2 View, azithromycin (ZITHROMAX) 250 MG tablet  Cough  Fever, unspecified  Bronchospasm - Plan: albuterol (PROVENTIL HFA;VENTOLIN HFA) 108 (90 Base) MCG/ACT inhaler  Left lower lobe pneumonia. O2 sat reassuring, does not appear toxic. Outpatient treatment initially with azithromycin, Mucinex DM. Reported episodic wheezing, overall clear on exam. Albuterol provided if needed, ER/RTC precautions if worsening, otherwise recheck in 48-72 hours.  Meds ordered this encounter  Medications  . azithromycin (ZITHROMAX) 250 MG tablet    Sig: Take 2 pills by mouth on day 1, then 1 pill by mouth per day on days 2 through 5.    Dispense:  6 tablet    Refill:  0  . albuterol (PROVENTIL HFA;VENTOLIN HFA) 108 (90 Base) MCG/ACT inhaler    Sig: Inhale 1-2 puffs into the lungs every 6 (six) hours as needed for wheezing or shortness of breath.    Dispense:  1 Inhaler    Refill:  0   Patient  Instructions   Start azithromycin, Mucinex or Mucinex DM as needed for cough, Tylenol or Motrin as needed for fever. If you do have wheezing, can use albuterol inhaler up to every 4 hours if needed. Recheck in 2-3 days, sooner or emergency room if worse.   Community-Acquired Pneumonia, Adult Pneumonia is an infection of the lungs. There are different types of pneumonia. One type can develop while a person is in a hospital. A different type, called community-acquired pneumonia, develops in people who are not, or have not recently been, in the hospital or other health care facility. What are the causes? Pneumonia may be caused by bacteria, viruses, or funguses. Community-acquired pneumonia is often caused by Streptococcus pneumonia bacteria. These bacteria are often passed from one person to another by breathing in droplets from the  cough or sneeze of an infected person. What increases the risk? The condition is more likely to develop in:  People who havechronic diseases, such as chronic obstructive pulmonary disease (COPD), asthma, congestive heart failure, cystic fibrosis, diabetes, or kidney disease.  People who haveearly-stage or late-stage HIV.  People who havesickle cell disease.  People who havehad their spleen removed (splenectomy).  People who havepoor Administrator.  People who havemedical conditions that increase the risk of breathing in (aspirating) secretions their own mouth and nose.  People who havea weakened immune system (immunocompromised).  People who smoke.  People whotravel to areas where pneumonia-causing germs commonly exist.  People whoare around animal habitats or animals that have pneumonia-causing germs, including birds, bats, rabbits, cats, and farm animals.  What are the signs or symptoms? Symptoms of this condition include:  Adry cough.  A wet (productive) cough.  Fever.  Sweating.  Chest pain, especially when breathing deeply or  coughing.  Rapid breathing or difficulty breathing.  Shortness of breath.  Shaking chills.  Fatigue.  Muscle aches.  How is this diagnosed? Your health care provider will take a medical history and perform a physical exam. You may also have other tests, including:  Imaging studies of your chest, including X-rays.  Tests to check your blood oxygen level and other blood gases.  Other tests on blood, mucus (sputum), fluid around your lungs (pleural fluid), and urine.  If your pneumonia is severe, other tests may be done to identify the specific cause of your illness. How is this treated? The type of treatment that you receive depends on many factors, such as the cause of your pneumonia, the medicines you take, and other medical conditions that you have. For most adults, treatment and recovery from pneumonia may occur at home. In some cases, treatment must happen in a hospital. Treatment may include:  Antibiotic medicines, if the pneumonia was caused by bacteria.  Antiviral medicines, if the pneumonia was caused by a virus.  Medicines that are given by mouth or through an IV tube.  Oxygen.  Respiratory therapy.  Although rare, treating severe pneumonia may include:  Mechanical ventilation. This is done if you are not breathing well on your own and you cannot maintain a safe blood oxygen level.  Thoracentesis. This procedureremoves fluid around one lung or both lungs to help you breathe better.  Follow these instructions at home:  Take over-the-counter and prescription medicines only as told by your health care provider. ? Only takecough medicine if you are losing sleep. Understand that cough medicine can prevent your body's natural ability to remove mucus from your lungs. ? If you were prescribed an antibiotic medicine, take it as told by your health care provider. Do not stop taking the antibiotic even if you start to feel better.  Sleep in a semi-upright position at  night. Try sleeping in a reclining chair, or place a few pillows under your head.  Do not use tobacco products, including cigarettes, chewing tobacco, and e-cigarettes. If you need help quitting, ask your health care provider.  Drink enough water to keep your urine clear or pale yellow. This will help to thin out mucus secretions in your lungs. How is this prevented? There are ways that you can decrease your risk of developing community-acquired pneumonia. Consider getting a pneumococcal vaccine if:  You are older than 27 years of age.  You are older than 27 years of age and are undergoing cancer treatment, have chronic lung disease, or have other  medical conditions that affect your immune system. Ask your health care provider if this applies to you.  There are different types and schedules of pneumococcal vaccines. Ask your health care provider which vaccination option is best for you. You may also prevent community-acquired pneumonia if you take these actions:  Get an influenza vaccine every year. Ask your health care provider which type of influenza vaccine is best for you.  Go to the dentist on a regular basis.  Wash your hands often. Use hand sanitizer if soap and water are not available.  Contact a health care provider if:  You have a fever.  You are losing sleep because you cannot control your cough with cough medicine. Get help right away if:  You have worsening shortness of breath.  You have increased chest pain.  Your sickness becomes worse, especially if you are an older adult or have a weakened immune system.  You cough up blood. This information is not intended to replace advice given to you by your health care provider. Make sure you discuss any questions you have with your health care provider. Document Released: 09/01/2005 Document Revised: 01/10/2016 Document Reviewed: 12/27/2014 Elsevier Interactive Patient Education  2017 [employer] Inc.  Cough,  Adult Coughing is a reflex that clears your throat and your airways. Coughing helps to heal and protect your lungs. It is normal to cough occasionally, but a cough that happens with other symptoms or lasts a long time may be a sign of a condition that needs treatment. A cough may last only 2-3 weeks (acute), or it may last longer than 8 weeks (chronic). What are the causes? Coughing is commonly caused by:  Breathing in substances that irritate your lungs.  A viral or bacterial respiratory infection.  Allergies.  Asthma.  Postnasal drip.  Smoking.  Acid backing up from the stomach into the esophagus (gastroesophageal reflux).  Certain medicines.  Chronic lung problems, including COPD (or rarely, lung cancer).  Other medical conditions such as heart failure.  Follow these instructions at home: Pay attention to any changes in your symptoms. Take these actions to help with your discomfort:  Take medicines only as told by your health care provider. ? If you were prescribed an antibiotic medicine, take it as told by your health care provider. Do not stop taking the antibiotic even if you start to feel better. ? Talk with your health care provider before you take a cough suppressant medicine.  Drink enough fluid to keep your urine clear or pale yellow.  If the air is dry, use a cold steam vaporizer or humidifier in your bedroom or your home to help loosen secretions.  Avoid anything that causes you to cough at work or at home.  If your cough is worse at night, try sleeping in a semi-upright position.  Avoid cigarette smoke. If you smoke, quit smoking. If you need help quitting, ask your health care provider.  Avoid caffeine.  Avoid alcohol.  Rest as needed.  Contact a health care provider if:  You have new symptoms.  You cough up pus.  Your cough does not get better after 2-3 weeks, or your cough gets worse.  You cannot control your cough with suppressant medicines  and you are losing sleep.  You develop pain that is getting worse or pain that is not controlled with pain medicines.  You have a fever.  You have unexplained weight loss.  You have night sweats. Get help right away if:  You cough up  blood.  You have difficulty breathing.  Your heartbeat is very fast. This information is not intended to replace advice given to you by your health care provider. Make sure you discuss any questions you have with your health care provider. Document Released: 02/28/2011 Document Revised: 02/07/2016 Document Reviewed: 11/08/2014 Elsevier Interactive Patient Education  2017 [employer].   IF you received an x-ray today, you will receive an invoice from [facility]. Please contact [facility] at [phone] with questions or concerns regarding your invoice.   IF you received labwork today, you will receive an invoice from [facility]. Please contact [facility] at [phone] with questions or concerns regarding your invoice.   Our billing staff will not be able to assist you with questions regarding bills from these companies.  You will be contacted with the lab results as soon as they are available. The fastest way to get your results is to activate your My Chart account. Instructions are located on the last page of this paperwork. If you have not heard from [place] regarding the results in 2 weeks, please contact this office.       I personally performed the services described in this documentation, which was scribed in my presence. The recorded information has been reviewed and considered for accuracy and completeness, addended by me as needed, and agree with information above.  Signed,   Meredith Staggers, MD Primary Care at [facility].  02/24/17 5:28 PM

## 2017-02-24 NOTE — Telephone Encounter (Signed)
CALLED IN PRESCRIPTION

## 2017-02-24 NOTE — Patient Instructions (Addendum)
Start azithromycin, Mucinex or Mucinex DM as needed for cough, Tylenol or Motrin as needed for fever. If you do have wheezing, can use albuterol inhaler up to every 4 hours if needed. Recheck in 2-3 days, sooner or emergency room if worse.   Community-Acquired Pneumonia, Adult Pneumonia is an infection of the lungs. There are different types of pneumonia. One type can develop while a person is in a hospital. A different type, called community-acquired pneumonia, develops in people who are not, or have not recently been, in the hospital or other health care facility. What are the causes? Pneumonia may be caused by bacteria, viruses, or funguses. Community-acquired pneumonia is often caused by Streptococcus pneumonia bacteria. These bacteria are often passed from one person to another by breathing in droplets from the cough or sneeze of an infected person. What increases the risk? The condition is more likely to develop in:  People who havechronic diseases, such as chronic obstructive pulmonary disease (COPD), asthma, congestive heart failure, cystic fibrosis, diabetes, or kidney disease.  People who haveearly-stage or late-stage HIV.  People who havesickle cell disease.  People who havehad their spleen removed (splenectomy).  People who havepoor Administratordental hygiene.  People who havemedical conditions that increase the risk of breathing in (aspirating) secretions their own mouth and nose.  People who havea weakened immune system (immunocompromised).  People who smoke.  People whotravel to areas where pneumonia-causing germs commonly exist.  People whoare around animal habitats or animals that have pneumonia-causing germs, including birds, bats, rabbits, cats, and farm animals.  What are the signs or symptoms? Symptoms of this condition include:  Adry cough.  A wet (productive) cough.  Fever.  Sweating.  Chest pain, especially when breathing deeply or coughing.  Rapid  breathing or difficulty breathing.  Shortness of breath.  Shaking chills.  Fatigue.  Muscle aches.  How is this diagnosed? Your health care provider will take a medical history and perform a physical exam. You may also have other tests, including:  Imaging studies of your chest, including X-rays.  Tests to check your blood oxygen level and other blood gases.  Other tests on blood, mucus (sputum), fluid around your lungs (pleural fluid), and urine.  If your pneumonia is severe, other tests may be done to identify the specific cause of your illness. How is this treated? The type of treatment that you receive depends on many factors, such as the cause of your pneumonia, the medicines you take, and other medical conditions that you have. For most adults, treatment and recovery from pneumonia may occur at home. In some cases, treatment must happen in a hospital. Treatment may include:  Antibiotic medicines, if the pneumonia was caused by bacteria.  Antiviral medicines, if the pneumonia was caused by a virus.  Medicines that are given by mouth or through an IV tube.  Oxygen.  Respiratory therapy.  Although rare, treating severe pneumonia may include:  Mechanical ventilation. This is done if you are not breathing well on your own and you cannot maintain a safe blood oxygen level.  Thoracentesis. This procedureremoves fluid around one lung or both lungs to help you breathe better.  Follow these instructions at home:  Take over-the-counter and prescription medicines only as told by your health care provider. ? Only takecough medicine if you are losing sleep. Understand that cough medicine can prevent your body's natural ability to remove mucus from your lungs. ? If you were prescribed an antibiotic medicine, take it as told by your health care  provider. Do not stop taking the antibiotic even if you start to feel better.  Sleep in a semi-upright position at night. Try sleeping in  a reclining chair, or place a few pillows under your head.  Do not use tobacco products, including cigarettes, chewing tobacco, and e-cigarettes. If you need help quitting, ask your health care provider.  Drink enough water to keep your urine clear or pale yellow. This will help to thin out mucus secretions in your lungs. How is this prevented? There are ways that you can decrease your risk of developing community-acquired pneumonia. Consider getting a pneumococcal vaccine if:  You are older than 27 years of age.  You are older than 26 years of age and are undergoing cancer treatment, have chronic lung disease, or have other medical conditions that affect your immune system. Ask your health care provider if this applies to you.  There are different types and schedules of pneumococcal vaccines. Ask your health care provider which vaccination option is best for you. You may also prevent community-acquired pneumonia if you take these actions:  Get an influenza vaccine every year. Ask your health care provider which type of influenza vaccine is best for you.  Go to the dentist on a regular basis.  Wash your hands often. Use hand sanitizer if soap and water are not available.  Contact a health care provider if:  You have a fever.  You are losing sleep because you cannot control your cough with cough medicine. Get help right away if:  You have worsening shortness of breath.  You have increased chest pain.  Your sickness becomes worse, especially if you are an older adult or have a weakened immune system.  You cough up blood. This information is not intended to replace advice given to you by your health care provider. Make sure you discuss any questions you have with your health care provider. Document Released: 09/01/2005 Document Revised: 01/10/2016 Document Reviewed: 12/27/2014 Elsevier Interactive Patient Education  2017 [employer] Inc[employer]  Cough, Adult Coughing is a reflex that  clears your throat and your airways. Coughing helps to heal and protect your lungs. It is normal to cough occasionally, but a cough that happens with other symptoms or lasts a long time may be a sign of a condition that needs treatment. A cough may last only 2-3 weeks (acute), or it may last longer than 8 weeks (chronic). What are the causes? Coughing is commonly caused by:  Breathing in substances that irritate your lungs.  A viral or bacterial respiratory infection.  Allergies.  Asthma.  Postnasal drip.  Smoking.  Acid backing up from the stomach into the esophagus (gastroesophageal reflux).  Certain medicines.  Chronic lung problems, including COPD (or rarely, lung cancer).  Other medical conditions such as heart failure.  Follow these instructions at home: Pay attention to any changes in your symptoms. Take these actions to help with your discomfort:  Take medicines only as told by your health care provider. ? If you were prescribed an antibiotic medicine, take it as told by your health care provider. Do not stop taking the antibiotic even if you start to feel better. ? Talk with your health care provider before you take a cough suppressant medicine.  Drink enough fluid to keep your urine clear or pale yellow.  If the air is dry, use a cold steam vaporizer or humidifier in your bedroom or your home to help loosen secretions.  Avoid anything that causes you to cough at work  or at home.  If your cough is worse at night, try sleeping in a semi-upright position.  Avoid cigarette smoke. If you smoke, quit smoking. If you need help quitting, ask your health care provider.  Avoid caffeine.  Avoid alcohol.  Rest as needed.  Contact a health care provider if:  You have new symptoms.  You cough up pus.  Your cough does not get better after 2-3 weeks, or your cough gets worse.  You cannot control your cough with suppressant medicines and you are losing sleep.  You  develop pain that is getting worse or pain that is not controlled with pain medicines.  You have a fever.  You have unexplained weight loss.  You have night sweats. Get help right away if:  You cough up blood.  You have difficulty breathing.  Your heartbeat is very fast. This information is not intended to replace advice given to you by your health care provider. Make sure you discuss any questions you have with your health care provider. Document Released: 02/28/2011 Document Revised: 02/07/2016 Document Reviewed: 11/08/2014 Elsevier Interactive Patient Education  2017 [employer].   IF you received an x-ray today, you will receive an invoice from [facility]. Please contact [facility] at [phone] with questions or concerns regarding your invoice.   IF you received labwork today, you will receive an invoice from [place]. Please contact [facility] at [phone] with questions or concerns regarding your invoice.   Our billing staff will not be able to assist you with questions regarding bills from these companies.  You will be contacted with the lab results as soon as they are available. The fastest way to get your results is to activate your My Chart account. Instructions are located on the last page of this paperwork. If you have not heard from [place] regarding the results in 2 weeks, please contact this office.

## 2017-02-26 ENCOUNTER — Ambulatory Visit (INDEPENDENT_AMBULATORY_CARE_PROVIDER_SITE_OTHER): Payer: BLUE CROSS/BLUE SHIELD | Admitting: Family Medicine

## 2017-02-26 ENCOUNTER — Encounter: Payer: Self-pay | Admitting: Family Medicine

## 2017-02-26 VITALS — BP 108/66 | HR 93 | Temp 98.1°F | Resp 18 | Ht 67.0 in | Wt 135.0 lb

## 2017-02-26 DIAGNOSIS — J181 Lobar pneumonia, unspecified organism: Secondary | ICD-10-CM

## 2017-02-26 DIAGNOSIS — J189 Pneumonia, unspecified organism: Secondary | ICD-10-CM

## 2017-02-26 DIAGNOSIS — R059 Cough, unspecified: Secondary | ICD-10-CM

## 2017-02-26 DIAGNOSIS — R05 Cough: Secondary | ICD-10-CM | POA: Diagnosis not present

## 2017-02-26 MED ORDER — HYDROCODONE-HOMATROPINE 5-1.5 MG/5ML PO SYRP: ORAL_SOLUTION | ORAL | 0 refills | Status: DC

## 2017-02-26 NOTE — Progress Notes (Signed)
By signing my name below, I, Mesha Guinyard, attest that this documentation has been prepared under the direction and in the presence of Meredith Staggers, MD.  Electronically Signed: Arvilla Market, Medical Scribe. 02/26/17. 2:35 PM.  Subjective:    Patient ID: [name] [name], female    DOB: Aug 13, 1991, 26 y.o.   MRN: [id number]  HPI Chief Complaint  Patient presents with  . Pneumonia  . Follow-up    HPI Comments: [name] [name] is a 26 y.o. female who presents to Primary Care at [facility] for left lower lobe vs lingular PNA follow-up. She was started on azithromycin, mucinex for cough.  Reports feeling worse with cough, some congestion, myalgias, chills, and fatigue. Her cough keeps her up at night and keeps the people in her house up. She's complaint with azithromycin with relief of her sxs and mucinex with little relief. She has also used albuterol at night without any relief of her sxs, and she's alternating between tylenol and ibuprofen.  She has been eating and drinking fine. Pt is not taking klonopin.  Patient Active Problem List   Diagnosis Date Noted  . Insomnia 12/09/2016  . ADHD (attention deficit hyperactivity disorder) 10/27/2014  . GAD (generalized anxiety disorder) 10/27/2014  . Other acne 05/03/2008  . Allergic rhinitis 01/27/2008   Past Medical History:  Diagnosis Date  . ADHD (attention deficit hyperactivity disorder)   . Anxiety   . Panic attacks   . Vaginal Pap smear, abnormal    Past Surgical History:  Procedure Laterality Date  . DILATION AND CURETTAGE OF UTERUS  01/04/16  . DILATION AND EVACUATION N/A 01/04/2016   Procedure: DILATATION AND EVACUATION;  Surgeon: Candice Camp, MD;  Location: [facility];  Service: Gynecology;  Laterality: N/A;  . INDUCED ABORTION    . wisdom teeth removal Bilateral    2010   Allergies  Allergen Reactions  . Ceclor [Cefaclor] Hives    Age 36 mild rash   Prior to Admission medications   Medication Sig Start Date End Date  Taking? Authorizing Provider  albuterol (PROVENTIL HFA;VENTOLIN HFA) 108 (90 Base) MCG/ACT inhaler Inhale 1-2 puffs into the lungs every 6 (six) hours as needed for wheezing or shortness of breath. 02/24/17  Yes Shade Flood, MD  amphetamine-dextroamphetamine (ADDERALL) 20 MG tablet Take 1 tablet (20 mg total) by mouth 3 (three) times daily. 02/06/17  Yes Ethelda Chick, MD  azithromycin (ZITHROMAX) 250 MG tablet Take 2 pills by mouth on day 1, then 1 pill by mouth per day on days 2 through 5. 02/24/17  Yes Shade Flood, MD  clonazePAM (KLONOPIN) 0.5 MG tablet Take 0.5-1 tablets (0.25-0.5 mg total) by mouth daily as needed for anxiety. 02/24/17  Yes Ethelda Chick, MD  traZODone (DESYREL) 50 MG tablet Take 0.5-1 tablets (25-50 mg total) by mouth at bedtime. 11/18/16  Yes Ethelda Chick, MD   Social History   Social History  . Marital status: Married    Spouse name: N/A  . Number of children: N/A  . Years of education: N/A   Occupational History  . Not on file.   Social History Main Topics  . Smoking status: Former Smoker    Types: Cigarettes    Quit date: 08/15/2013  . Smokeless tobacco: Never Used  . Alcohol use No     Comment: socially, not with preg  . Drug use: No  . Sexual activity: Not Currently    Birth control/ protection: None   Other Topics Concern  .  Not on file   Social History Narrative  . No narrative on file   Review of Systems  Constitutional: Positive for chills and fatigue. Negative for appetite change.  HENT: Positive for congestion. Negative for trouble swallowing.   Respiratory: Positive for cough.   Musculoskeletal: Positive for myalgias.  Psychiatric/Behavioral: Positive for sleep disturbance.   Objective:  Physical Exam  Constitutional: She is oriented to person, place, and time. She appears well-developed and well-nourished. No distress.  HENT:  Head: Normocephalic and atraumatic.  Right Ear: Hearing, tympanic membrane, external ear and  ear canal normal.  Left Ear: Hearing, tympanic membrane, external ear and ear canal normal.  Nose: Nose normal.  Mouth/Throat: Oropharynx is clear and moist. No oropharyngeal exudate.  Eyes: Conjunctivae and EOM are normal. Pupils are equal, round, and reactive to light.  Cardiovascular: Normal rate, regular rhythm, normal heart sounds and intact distal pulses.  Exam reveals no gallop and no friction rub.   No murmur heard. Pulmonary/Chest: Effort normal. No respiratory distress. She has no decreased breath sounds. She has no wheezes. She has rhonchi in the left lower field. She has no rales.  Few coarse breath sounds in RLL  Lymphadenopathy:    She has no cervical adenopathy.  Neurological: She is alert and oriented to person, place, and time.  Skin: Skin is warm and dry. No rash noted.  Psychiatric: She has a normal mood and affect. Her behavior is normal.  Vitals reviewed.   Vitals:   02/26/17 1419  BP: 108/66  Pulse: 93  Resp: 18  Temp: 98.1 F (36.7 C)  TempSrc: Oral  SpO2: 100%  Weight: 135 lb (61.2 kg)  Height: 5\' 7"  (1.702 m)   Body mass index is 21.14 kg/m. Assessment & Plan:    [name] CoyerSophia M [name] is a 26 y.o. female Pneumonia of left lower lobe due to infectious organism (HCC)  Cough - Plan: HYDROcodone-homatropine (HYCODAN) 5-1.5 MG/5ML syrup Some worsening fatigue, but not dyspneic, O2 sat and temp reassuring. Still appears to primarily have rhonchi on exam. Has albuterol if needed for wheezing.  -Continue Mucinex during the day, Hycodan given for nighttime use, or during the day if needed for more severe coughing fits.   -As long as she is improving, can follow-up in 4 weeks for repeat chest x-ray, return sooner if any persistent fever, or continued subjective worsening.  Meds ordered this encounter  Medications  . HYDROcodone-homatropine (HYCODAN) 5-1.5 MG/5ML syrup    Sig: 1078m by mouth a bedtime as needed for cough.    Dispense:  120 mL    Refill:  0   Patient  Instructions    Although I still hear congestion with a pneumonia today, your oxygen and temperature are normal. Continue Mucinex during the day, albuterol if needed for wheezing, but can also start hydrocodone cough syrup at night. Do not combine that with other sedating medications. If you do need to use that medicine once or twice during the day, do not exceed every 4-6 hours.  If any continued worsening into Saturday including any fever or shortness of breath, return for repeat x-ray and assessment. Plan for follow-up in 4 weeks to repeat x-rays, sooner if you are not improving or any worsening.    Community-Acquired Pneumonia, Adult Pneumonia is an infection of the lungs. There are different types of pneumonia. One type can develop while a person is in a hospital. A different type, called community-acquired pneumonia, develops in people who are not, or have not recently  been, in the hospital or other health care facility. What are the causes? Pneumonia may be caused by bacteria, viruses, or funguses. Community-acquired pneumonia is often caused by Streptococcus pneumonia bacteria. These bacteria are often passed from one person to another by breathing in droplets from the cough or sneeze of an infected person. What increases the risk? The condition is more likely to develop in:  People who havechronic diseases, such as chronic obstructive pulmonary disease (COPD), asthma, congestive heart failure, cystic fibrosis, diabetes, or kidney disease.  People who haveearly-stage or late-stage HIV.  People who havesickle cell disease.  People who havehad their spleen removed (splenectomy).  People who havepoor Administrator.  People who havemedical conditions that increase the risk of breathing in (aspirating) secretions their own mouth and nose.  People who havea weakened immune system (immunocompromised).  People who smoke.  People whotravel to areas where pneumonia-causing  germs commonly exist.  People whoare around animal habitats or animals that have pneumonia-causing germs, including birds, bats, rabbits, cats, and farm animals.  What are the signs or symptoms? Symptoms of this condition include:  Adry cough.  A wet (productive) cough.  Fever.  Sweating.  Chest pain, especially when breathing deeply or coughing.  Rapid breathing or difficulty breathing.  Shortness of breath.  Shaking chills.  Fatigue.  Muscle aches.  How is this diagnosed? Your health care provider will take a medical history and perform a physical exam. You may also have other tests, including:  Imaging studies of your chest, including X-rays.  Tests to check your blood oxygen level and other blood gases.  Other tests on blood, mucus (sputum), fluid around your lungs (pleural fluid), and urine.  If your pneumonia is severe, other tests may be done to identify the specific cause of your illness. How is this treated? The type of treatment that you receive depends on many factors, such as the cause of your pneumonia, the medicines you take, and other medical conditions that you have. For most adults, treatment and recovery from pneumonia may occur at home. In some cases, treatment must happen in a hospital. Treatment may include:  Antibiotic medicines, if the pneumonia was caused by bacteria.  Antiviral medicines, if the pneumonia was caused by a virus.  Medicines that are given by mouth or through an IV tube.  Oxygen.  Respiratory therapy.  Although rare, treating severe pneumonia may include:  Mechanical ventilation. This is done if you are not breathing well on your own and you cannot maintain a safe blood oxygen level.  Thoracentesis. This procedureremoves fluid around one lung or both lungs to help you breathe better.  Follow these instructions at home:  Take over-the-counter and prescription medicines only as told by your health care provider. ? Only  takecough medicine if you are losing sleep. Understand that cough medicine can prevent your body's natural ability to remove mucus from your lungs. ? If you were prescribed an antibiotic medicine, take it as told by your health care provider. Do not stop taking the antibiotic even if you start to feel better.  Sleep in a semi-upright position at night. Try sleeping in a reclining chair, or place a few pillows under your head.  Do not use tobacco products, including cigarettes, chewing tobacco, and e-cigarettes. If you need help quitting, ask your health care provider.  Drink enough water to keep your urine clear or pale yellow. This will help to thin out mucus secretions in your lungs. How is this prevented? There are  ways that you can decrease your risk of developing community-acquired pneumonia. Consider getting a pneumococcal vaccine if:  You are older than 27 years of age.  You are older than 27 years of age and are undergoing cancer treatment, have chronic lung disease, or have other medical conditions that affect your immune system. Ask your health care provider if this applies to you.  There are different types and schedules of pneumococcal vaccines. Ask your health care provider which vaccination option is best for you. You may also prevent community-acquired pneumonia if you take these actions:  Get an influenza vaccine every year. Ask your health care provider which type of influenza vaccine is best for you.  Go to the dentist on a regular basis.  Wash your hands often. Use hand sanitizer if soap and water are not available.  Contact a health care provider if:  You have a fever.  You are losing sleep because you cannot control your cough with cough medicine. Get help right away if:  You have worsening shortness of breath.  You have increased chest pain.  Your sickness becomes worse, especially if you are an older adult or have a weakened immune system.  You cough up  blood. This information is not intended to replace advice given to you by your health care provider. Make sure you discuss any questions you have with your health care provider. Document Released: 09/01/2005 Document Revised: 01/10/2016 Document Reviewed: 12/27/2014 Elsevier Interactive Patient Education  2017 [employer].   IF you received an x-ray today, you will receive an invoice from [facility]. Please contact [facility] at [phone] with questions or concerns regarding your invoice.   IF you received labwork today, you will receive an invoice from [place]. Please contact [facility] at [phone] with questions or concerns regarding your invoice.   Our billing staff will not be able to assist you with questions regarding bills from these companies.  You will be contacted with the lab results as soon as they are available. The fastest way to get your results is to activate your My Chart account. Instructions are located on the last page of this paperwork. If you have not heard from [place] regarding the results in 2 weeks, please contact this office.       I personally performed the services described in this documentation, which was scribed in my presence. The recorded information has been reviewed and considered for accuracy and completeness, addended by me as needed, and agree with information above.  Signed,   Meredith Staggers, MD Primary Care at [facility].  02/27/17 3:52 PM

## 2017-02-26 NOTE — Patient Instructions (Addendum)
Although I still hear congestion with a pneumonia today, your oxygen and temperature are normal. Continue Mucinex during the day, albuterol if needed for wheezing, but can also start hydrocodone cough syrup at night. Do not combine that with other sedating medications. If you do need to use that medicine once or twice during the day, do not exceed every 4-6 hours.  If any continued worsening into Saturday including any fever or shortness of breath, return for repeat x-ray and assessment. Plan for follow-up in 4 weeks to repeat x-rays, sooner if you are not improving or any worsening.    Community-Acquired Pneumonia, Adult Pneumonia is an infection of the lungs. There are different types of pneumonia. One type can develop while a person is in a hospital. A different type, called community-acquired pneumonia, develops in people who are not, or have not recently been, in the hospital or other health care facility. What are the causes? Pneumonia may be caused by bacteria, viruses, or funguses. Community-acquired pneumonia is often caused by Streptococcus pneumonia bacteria. These bacteria are often passed from one person to another by breathing in droplets from the cough or sneeze of an infected person. What increases the risk? The condition is more likely to develop in:  People who havechronic diseases, such as chronic obstructive pulmonary disease (COPD), asthma, congestive heart failure, cystic fibrosis, diabetes, or kidney disease.  People who haveearly-stage or late-stage HIV.  People who havesickle cell disease.  People who havehad their spleen removed (splenectomy).  People who havepoor Administratordental hygiene.  People who havemedical conditions that increase the risk of breathing in (aspirating) secretions their own mouth and nose.  People who havea weakened immune system (immunocompromised).  People who smoke.  People whotravel to areas where pneumonia-causing germs commonly  exist.  People whoare around animal habitats or animals that have pneumonia-causing germs, including birds, bats, rabbits, cats, and farm animals.  What are the signs or symptoms? Symptoms of this condition include:  Adry cough.  A wet (productive) cough.  Fever.  Sweating.  Chest pain, especially when breathing deeply or coughing.  Rapid breathing or difficulty breathing.  Shortness of breath.  Shaking chills.  Fatigue.  Muscle aches.  How is this diagnosed? Your health care provider will take a medical history and perform a physical exam. You may also have other tests, including:  Imaging studies of your chest, including X-rays.  Tests to check your blood oxygen level and other blood gases.  Other tests on blood, mucus (sputum), fluid around your lungs (pleural fluid), and urine.  If your pneumonia is severe, other tests may be done to identify the specific cause of your illness. How is this treated? The type of treatment that you receive depends on many factors, such as the cause of your pneumonia, the medicines you take, and other medical conditions that you have. For most adults, treatment and recovery from pneumonia may occur at home. In some cases, treatment must happen in a hospital. Treatment may include:  Antibiotic medicines, if the pneumonia was caused by bacteria.  Antiviral medicines, if the pneumonia was caused by a virus.  Medicines that are given by mouth or through an IV tube.  Oxygen.  Respiratory therapy.  Although rare, treating severe pneumonia may include:  Mechanical ventilation. This is done if you are not breathing well on your own and you cannot maintain a safe blood oxygen level.  Thoracentesis. This procedureremoves fluid around one lung or both lungs to help you breathe better.  Follow these  instructions at home:  Take over-the-counter and prescription medicines only as told by your health care provider. ? Only takecough  medicine if you are losing sleep. Understand that cough medicine can prevent your body's natural ability to remove mucus from your lungs. ? If you were prescribed an antibiotic medicine, take it as told by your health care provider. Do not stop taking the antibiotic even if you start to feel better.  Sleep in a semi-upright position at night. Try sleeping in a reclining chair, or place a few pillows under your head.  Do not use tobacco products, including cigarettes, chewing tobacco, and e-cigarettes. If you need help quitting, ask your health care provider.  Drink enough water to keep your urine clear or pale yellow. This will help to thin out mucus secretions in your lungs. How is this prevented? There are ways that you can decrease your risk of developing community-acquired pneumonia. Consider getting a pneumococcal vaccine if:  You are older than 27 years of age.  You are older than 27 years of age and are undergoing cancer treatment, have chronic lung disease, or have other medical conditions that affect your immune system. Ask your health care provider if this applies to you.  There are different types and schedules of pneumococcal vaccines. Ask your health care provider which vaccination option is best for you. You may also prevent community-acquired pneumonia if you take these actions:  Get an influenza vaccine every year. Ask your health care provider which type of influenza vaccine is best for you.  Go to the dentist on a regular basis.  Wash your hands often. Use hand sanitizer if soap and water are not available.  Contact a health care provider if:  You have a fever.  You are losing sleep because you cannot control your cough with cough medicine. Get help right away if:  You have worsening shortness of breath.  You have increased chest pain.  Your sickness becomes worse, especially if you are an older adult or have a weakened immune system.  You cough up blood. This  information is not intended to replace advice given to you by your health care provider. Make sure you discuss any questions you have with your health care provider. Document Released: 09/01/2005 Document Revised: 01/10/2016 Document Reviewed: 12/27/2014 Elsevier Interactive Patient Education  2017 [employer].   IF you received an x-ray today, you will receive an invoice from [facility]. Please contact [facility] at [phone] with questions or concerns regarding your invoice.   IF you received labwork today, you will receive an invoice from [facility]. Please contact [facility] at [phone] with questions or concerns regarding your invoice.   Our billing staff will not be able to assist you with questions regarding bills from these companies.  You will be contacted with the lab results as soon as they are available. The fastest way to get your results is to activate your My Chart account. Instructions are located on the last page of this paperwork. If you have not heard from [place] regarding the results in 2 weeks, please contact this office.

## 2017-03-06 ENCOUNTER — Ambulatory Visit (INDEPENDENT_AMBULATORY_CARE_PROVIDER_SITE_OTHER): Payer: BLUE CROSS/BLUE SHIELD | Admitting: Psychology

## 2017-03-06 DIAGNOSIS — F411 Generalized anxiety disorder: Secondary | ICD-10-CM

## 2017-03-09 ENCOUNTER — Ambulatory Visit: Payer: Self-pay | Admitting: Psychology

## 2017-03-09 ENCOUNTER — Encounter: Payer: Self-pay | Admitting: Family Medicine

## 2017-03-24 ENCOUNTER — Ambulatory Visit: Payer: Self-pay | Admitting: Psychology

## 2017-04-02 ENCOUNTER — Ambulatory Visit: Payer: Self-pay | Admitting: Family Medicine

## 2017-04-03 ENCOUNTER — Ambulatory Visit: Payer: Self-pay | Admitting: Psychology

## 2017-04-06 ENCOUNTER — Ambulatory Visit: Payer: Self-pay | Admitting: Family Medicine

## 2017-04-10 ENCOUNTER — Ambulatory Visit: Payer: Self-pay | Admitting: Family Medicine

## 2017-04-15 ENCOUNTER — Other Ambulatory Visit: Payer: Self-pay | Admitting: Family Medicine

## 2017-04-15 ENCOUNTER — Encounter: Payer: Self-pay | Admitting: Family Medicine

## 2017-04-15 DIAGNOSIS — F902 Attention-deficit hyperactivity disorder, combined type: Secondary | ICD-10-CM

## 2017-04-24 ENCOUNTER — Encounter: Payer: Self-pay | Admitting: Family Medicine

## 2017-05-01 ENCOUNTER — Ambulatory Visit (INDEPENDENT_AMBULATORY_CARE_PROVIDER_SITE_OTHER): Payer: BLUE CROSS/BLUE SHIELD | Admitting: Psychology

## 2017-05-01 DIAGNOSIS — F411 Generalized anxiety disorder: Secondary | ICD-10-CM

## 2017-05-04 MED ORDER — AMPHETAMINE-DEXTROAMPHETAMINE 20 MG PO TABS: 20.0000 mg | ORAL_TABLET | Freq: Three times a day (TID) | ORAL | 0 refills | Status: DC

## 2017-06-03 ENCOUNTER — Ambulatory Visit: Payer: BLUE CROSS/BLUE SHIELD | Admitting: Psychology

## 2017-06-18 ENCOUNTER — Ambulatory Visit: Payer: BLUE CROSS/BLUE SHIELD | Admitting: Psychology

## 2017-06-26 ENCOUNTER — Encounter: Payer: Self-pay | Admitting: Physician Assistant

## 2017-06-29 ENCOUNTER — Ambulatory Visit (INDEPENDENT_AMBULATORY_CARE_PROVIDER_SITE_OTHER): Payer: BLUE CROSS/BLUE SHIELD | Admitting: Physician Assistant

## 2017-06-29 ENCOUNTER — Encounter: Payer: Self-pay | Admitting: Physician Assistant

## 2017-06-29 VITALS — BP 108/70 | HR 90 | Temp 98.4°F | Resp 16 | Ht 67.0 in | Wt 139.6 lb

## 2017-06-29 DIAGNOSIS — Z1329 Encounter for screening for other suspected endocrine disorder: Secondary | ICD-10-CM

## 2017-06-29 DIAGNOSIS — Z0000 Encounter for general adult medical examination without abnormal findings: Secondary | ICD-10-CM

## 2017-06-29 DIAGNOSIS — Z130 Encounter for screening for diseases of the blood and blood-forming organs and certain disorders involving the immune mechanism: Secondary | ICD-10-CM

## 2017-06-29 DIAGNOSIS — J9801 Acute bronchospasm: Secondary | ICD-10-CM | POA: Diagnosis not present

## 2017-06-29 DIAGNOSIS — Z13220 Encounter for screening for lipoid disorders: Secondary | ICD-10-CM

## 2017-06-29 DIAGNOSIS — Z23 Encounter for immunization: Secondary | ICD-10-CM | POA: Diagnosis not present

## 2017-06-29 LAB — CBC WITH DIFFERENTIAL/PLATELET
Basophils Absolute: 0 10*3/uL (ref 0.0–0.2)
Basos: 0 %
EOS (ABSOLUTE): 0.2 10*3/uL (ref 0.0–0.4)
Eos: 4 %
Hematocrit: 41 % (ref 34.0–46.6)
Hemoglobin: 13.7 g/dL (ref 11.1–15.9)
Immature Grans (Abs): 0 10*3/uL (ref 0.0–0.1)
Immature Granulocytes: 0 %
Lymphocytes Absolute: 1.7 10*3/uL (ref 0.7–3.1)
Lymphs: 28 %
MCH: 31 pg (ref 26.6–33.0)
MCHC: 33.4 g/dL (ref 31.5–35.7)
MCV: 93 fL (ref 79–97)
Monocytes Absolute: 0.6 10*3/uL (ref 0.1–0.9)
Monocytes: 10 %
Neutrophils Absolute: 3.4 10*3/uL (ref 1.4–7.0)
Neutrophils: 58 %
Platelets: 312 10*3/uL (ref 150–379)
RBC: 4.42 x10E6/uL (ref 3.77–5.28)
RDW: 13.3 % (ref 12.3–15.4)
WBC: 5.9 10*3/uL (ref 3.4–10.8)

## 2017-06-29 LAB — CMP14+EGFR
ALT: 13 IU/L (ref 0–32)
AST: 16 IU/L (ref 0–40)
Albumin/Globulin Ratio: 1.7 (ref 1.2–2.2)
Albumin: 4.5 g/dL (ref 3.5–5.5)
Alkaline Phosphatase: 81 IU/L (ref 39–117)
BUN/Creatinine Ratio: 11 (ref 9–23)
BUN: 8 mg/dL (ref 6–20)
Bilirubin Total: 0.8 mg/dL (ref 0.0–1.2)
CO2: 25 mmol/L (ref 20–29)
Calcium: 9.5 mg/dL (ref 8.7–10.2)
Chloride: 103 mmol/L (ref 96–106)
Creatinine, Ser: 0.72 mg/dL (ref 0.57–1.00)
GFR calc Af Amer: 133 mL/min/{1.73_m2} (ref >59)
GFR calc non Af Amer: 115 mL/min/{1.73_m2} (ref >59)
Globulin, Total: 2.6 g/dL (ref 1.5–4.5)
Glucose: 80 mg/dL (ref 65–99)
Potassium: 4.4 mmol/L (ref 3.5–5.2)
Sodium: 141 mmol/L (ref 134–144)
Total Protein: 7.1 g/dL (ref 6.0–8.5)

## 2017-06-29 LAB — POCT URINALYSIS DIP (MANUAL ENTRY)
Bilirubin, UA: NEGATIVE
Glucose, UA: NEGATIVE mg/dL
Ketones, POC UA: NEGATIVE mg/dL
Leukocytes, UA: NEGATIVE
Nitrite, UA: NEGATIVE
Protein Ur, POC: NEGATIVE mg/dL
Spec Grav, UA: 1.02 (ref 1.010–1.025)
Urobilinogen, UA: 0.2 E.U./dL
pH, UA: 8.5 — AB (ref 5.0–8.0)

## 2017-06-29 LAB — LIPID PANEL
Chol/HDL Ratio: 2.9 ratio (ref 0.0–4.4)
Cholesterol, Total: 110 mg/dL (ref 100–199)
HDL: 38 mg/dL — ABNORMAL LOW (ref >39)
LDL Calculated: 61 mg/dL (ref 0–99)
Triglycerides: 55 mg/dL (ref 0–149)
VLDL Cholesterol Cal: 11 mg/dL (ref 5–40)

## 2017-06-29 MED ORDER — ALBUTEROL SULFATE HFA 108 (90 BASE) MCG/ACT IN AERS: 1.0000 | INHALATION_SPRAY | Freq: Four times a day (QID) | RESPIRATORY_TRACT | 0 refills | Status: DC | PRN

## 2017-06-29 NOTE — Patient Instructions (Addendum)
We will contact you with the results of your lab work.  Delsym for cough. Stay well hydrated - drink 2 liters of water daily.   For sore throat try using a honey-based tea. Use 3 teaspoons of honey with juice squeezed from half lemon. Place shaved pieces of ginger into 1/2-1 cup of water and warm over stove top. Then mix the ingredients and repeat every 4 hours as needed.  Cough Syrup Recipe: Sweet Lemon & Honey Thyme  Ingredients a handful of fresh thyme sprigs   1 pint of water (2 cups)  1/2 cup honey (raw is best, but regular will do)  1/2 lemon chopped Instructions 1. Place the lemon in the pint jar and cover with the honey. The honey will macerate the lemons and draw out liquids which taste so delicious! 2. Meanwhile, toss the thyme leaves into a saucepan and cover them with the water. 3. Bring the water to a gentle simmer and reduce it to half, about a cup of tea. 4. When the tea is reduced and cooled a bit, strain the sprigs & leaves, add it into the pint jar and stir it well. 5. Give it a shake and use a spoonful as needed. 6. Store your homemade cough syrup in the refrigerator for about a month.  What causes a cough? In adults, common causes of a cough include: ?An infection of the airways or lungs (such as the common cold) ?Postnasal drip - Postnasal drip is when mucus from the nose drips down or flows along the back of the throat. Postnasal drip can happen when people have: .A cold .Allergies .A sinus infection - The sinuses are hollow areas in the bones of the face that open into the nose. ?Lung conditions, like asthma and chronic obstructive pulmonary disease (COPD) - Both of these conditions can make it hard to breathe. COPD is usually caused by smoking. ?Acid reflux - Acid reflux is when the acid that is normally in your stomach backs up into your esophagus (the tube that carries food from your mouth to your stomach). ?A side effect from blood pressure medicines called  "ACE inhibitors" ?Smoking cigarettes  Is there anything I can do on my own to get rid of my cough? Yes. To help get rid of your cough, you can: ?Use a humidifier in your bedroom ?Use an over-the-counter cough medicine, or suck on cough drops or hard candy ?Stop smoking, if you smoke ?If you have allergies, avoid the things you are allergic to (like pollen, dust, animals, or mold) If you have acid reflux, your doctor or nurse will tell you which lifestyle changes can help reduce symptoms.    Health Maintenance, Female Adopting a healthy lifestyle and getting preventive care can go a long way to promote health and wellness. Talk with your health care provider about what schedule of regular examinations is right for you. This is a good chance for you to check in with your provider about disease prevention and staying healthy. In between checkups, there are plenty of things you can do on your own. Experts have done a lot of research about which lifestyle changes and preventive measures are most likely to keep you healthy. Ask your health care provider for more information. Weight and diet Eat a healthy diet  Be sure to include plenty of vegetables, fruits, low-fat dairy products, and lean protein.  Do not eat a lot of foods high in solid fats, added sugars, or salt.  Get regular exercise. This  is one of the most important things you can do for your health. ? Most adults should exercise for at least 150 minutes each week. The exercise should increase your heart rate and make you sweat (moderate-intensity exercise). ? Most adults should also do strengthening exercises at least twice a week. This is in addition to the moderate-intensity exercise.  Maintain a healthy weight  Body mass index (BMI) is a measurement that can be used to identify possible weight problems. It estimates body fat based on height and weight. Your health care provider can help determine your BMI and help you achieve or  maintain a healthy weight.  For females 7 years of age and older: ? A BMI below 18.5 is considered underweight. ? A BMI of 18.5 to 24.9 is normal. ? A BMI of 25 to 29.9 is considered overweight. ? A BMI of 30 and above is considered obese.  Watch levels of cholesterol and blood lipids  You should start having your blood tested for lipids and cholesterol at 27 years of age, then have this test every 5 years.  You may need to have your cholesterol levels checked more often if: ? Your lipid or cholesterol levels are high. ? You are older than 27 years of age. ? You are at high risk for heart disease.  Cancer screening Lung Cancer  Lung cancer screening is recommended for adults 34-67 years old who are at high risk for lung cancer because of a history of smoking.  A yearly low-dose CT scan of the lungs is recommended for people who: ? Currently smoke. ? Have quit within the past 15 years. ? Have at least a 30-pack-year history of smoking. A pack year is smoking an average of one pack of cigarettes a day for 1 year.  Yearly screening should continue until it has been 15 years since you quit.  Yearly screening should stop if you develop a health problem that would prevent you from having lung cancer treatment.  Breast Cancer  Practice breast self-awareness. This means understanding how your breasts normally appear and feel.  It also means doing regular breast self-exams. Let your health care provider know about any changes, no matter how small.  If you are in your 20s or 30s, you should have a clinical breast exam (CBE) by a health care provider every 1-3 years as part of a regular health exam.  If you are 62 or older, have a CBE every year. Also consider having a breast X-ray (mammogram) every year.  If you have a family history of breast cancer, talk to your health care provider about genetic screening.  If you are at high risk for breast cancer, talk to your health care  provider about having an MRI and a mammogram every year.  Breast cancer gene (BRCA) assessment is recommended for women who have family members with BRCA-related cancers. BRCA-related cancers include: ? Breast. ? Ovarian. ? Tubal. ? Peritoneal cancers.  Results of the assessment will determine the need for genetic counseling and BRCA1 and BRCA2 testing.  Cervical Cancer Your health care provider may recommend that you be screened regularly for cancer of the pelvic organs (ovaries, uterus, and vagina). This screening involves a pelvic examination, including checking for microscopic changes to the surface of your cervix (Pap test). You may be encouraged to have this screening done every 3 years, beginning at age 84.  For women ages 44-65, health care providers may recommend pelvic exams and Pap testing every 3 years,  or they may recommend the Pap and pelvic exam, combined with testing for human papilloma virus (HPV), every 5 years. Some types of HPV increase your risk of cervical cancer. Testing for HPV may also be done on women of any age with unclear Pap test results.  Other health care providers may not recommend any screening for nonpregnant women who are considered low risk for pelvic cancer and who do not have symptoms. Ask your health care provider if a screening pelvic exam is right for you.  If you have had past treatment for cervical cancer or a condition that could lead to cancer, you need Pap tests and screening for cancer for at least 20 years after your treatment. If Pap tests have been discontinued, your risk factors (such as having a new sexual partner) need to be reassessed to determine if screening should resume. Some women have medical problems that increase the chance of getting cervical cancer. In these cases, your health care provider may recommend more frequent screening and Pap tests.  Colorectal Cancer  This type of cancer can be detected and often prevented.  Routine  colorectal cancer screening usually begins at 27 years of age and continues through 27 years of age.  Your health care provider may recommend screening at an earlier age if you have risk factors for colon cancer.  Your health care provider may also recommend using home test kits to check for hidden blood in the stool.  A small camera at the end of a tube can be used to examine your colon directly (sigmoidoscopy or colonoscopy). This is done to check for the earliest forms of colorectal cancer.  Routine screening usually begins at age 74.  Direct examination of the colon should be repeated every 5-10 years through 27 years of age. However, you may need to be screened more often if early forms of precancerous polyps or small growths are found.  Skin Cancer  Check your skin from head to toe regularly.  Tell your health care provider about any new moles or changes in moles, especially if there is a change in a mole's shape or color.  Also tell your health care provider if you have a mole that is larger than the size of a pencil eraser.  Always use sunscreen. Apply sunscreen liberally and repeatedly throughout the day.  Protect yourself by wearing long sleeves, pants, a wide-brimmed hat, and sunglasses whenever you are outside.  Heart disease, diabetes, and high blood pressure  High blood pressure causes heart disease and increases the risk of stroke. High blood pressure is more likely to develop in: ? People who have blood pressure in the high end of the normal range (130-139/85-89 mm Hg). ? People who are overweight or obese. ? People who are African American.  If you are 49-43 years of age, have your blood pressure checked every 3-5 years. If you are 57 years of age or older, have your blood pressure checked every year. You should have your blood pressure measured twice-once when you are at a hospital or clinic, and once when you are not at a hospital or clinic. Record the average of the  two measurements. To check your blood pressure when you are not at a hospital or clinic, you can use: ? An automated blood pressure machine at a pharmacy. ? A home blood pressure monitor.  If you are between 36 years and 105 years old, ask your health care provider if you should take aspirin to prevent strokes.  Have regular diabetes screenings. This involves taking a blood sample to check your fasting blood sugar level. ? If you are at a normal weight and have a low risk for diabetes, have this test once every three years after 27 years of age. ? If you are overweight and have a high risk for diabetes, consider being tested at a younger age or more often. Preventing infection Hepatitis B  If you have a higher risk for hepatitis B, you should be screened for this virus. You are considered at high risk for hepatitis B if: ? You were born in a country where hepatitis B is common. Ask your health care provider which countries are considered high risk. ? Your parents were born in a high-risk country, and you have not been immunized against hepatitis B (hepatitis B vaccine). ? You have HIV or AIDS. ? You use needles to inject street drugs. ? You live with someone who has hepatitis B. ? You have had sex with someone who has hepatitis B. ? You get hemodialysis treatment. ? You take certain medicines for conditions, including cancer, organ transplantation, and autoimmune conditions.  Hepatitis C  Blood testing is recommended for: ? Everyone born from [birth year] through 1966. ? Anyone with known risk factors for hepatitis C.  Sexually transmitted infections (STIs)  You should be screened for sexually transmitted infections (STIs) including gonorrhea and chlamydia if: ? You are sexually active and are younger than 27 years of age. ? You are older than 27 years of age and your health care provider tells you that you are at risk for this type of infection. ? Your sexual activity has changed since you  were last screened and you are at an increased risk for chlamydia or gonorrhea. Ask your health care provider if you are at risk.  If you do not have HIV, but are at risk, it may be recommended that you take a prescription medicine daily to prevent HIV infection. This is called pre-exposure prophylaxis (PrEP). You are considered at risk if: ? You are sexually active and do not regularly use condoms or know the HIV status of your partner(s). ? You take drugs by injection. ? You are sexually active with a partner who has HIV.  Talk with your health care provider about whether you are at high risk of being infected with HIV. If you choose to begin PrEP, you should first be tested for HIV. You should then be tested every 3 months for as long as you are taking PrEP. Pregnancy  If you are premenopausal and you may become pregnant, ask your health care provider about preconception counseling.  If you may become pregnant, take 400 to 800 micrograms (mcg) of folic acid every day.  If you want to prevent pregnancy, talk to your health care provider about birth control (contraception). Osteoporosis and menopause  Osteoporosis is a disease in which the bones lose minerals and strength with aging. This can result in serious bone fractures. Your risk for osteoporosis can be identified using a bone density scan.  If you are 6 years of age or older, or if you are at risk for osteoporosis and fractures, ask your health care provider if you should be screened.  Ask your health care provider whether you should take a calcium or vitamin D supplement to lower your risk for osteoporosis.  Menopause may have certain physical symptoms and risks.  Hormone replacement therapy may reduce some of these symptoms and risks. Talk to your health  care provider about whether hormone replacement therapy is right for you. Follow these instructions at home:  Schedule regular health, dental, and eye exams.  Stay current  with your immunizations.  Do not use any tobacco products including cigarettes, chewing tobacco, or electronic cigarettes.  If you are pregnant, do not drink alcohol.  If you are breastfeeding, limit how much and how often you drink alcohol.  Limit alcohol intake to no more than 1 drink per day for nonpregnant women. One drink equals 12 ounces of beer, 5 ounces of wine, or 1 ounces of hard liquor.  Do not use street drugs.  Do not share needles.  Ask your health care provider for help if you need support or information about quitting drugs.  Tell your health care provider if you often feel depressed.  Tell your health care provider if you have ever been abused or do not feel safe at home. This information is not intended to replace advice given to you by your health care provider. Make sure you discuss any questions you have with your health care provider. Document Released: 03/17/2011 Document Revised: 02/07/2016 Document Reviewed: 06/05/2015 Elsevier Interactive Patient Education  2018 [employer].  IF you received an x-ray today, you will receive an invoice from [facility]. Please contact [facility] at [phone] with questions or concerns regarding your invoice.   IF you received labwork today, you will receive an invoice from [facility]. Please contact [facility] at [phone] with questions or concerns regarding your invoice.   Our billing staff will not be able to assist you with questions regarding bills from these companies.  You will be contacted with the lab results as soon as they are available. The fastest way to get your results is to activate your My Chart account. Instructions are located on the last page of this paperwork. If you have not heard from [place] regarding the results in 2 weeks, please contact this office.

## 2017-06-29 NOTE — Progress Notes (Signed)
[facility] [address], [place] [address] [phone]  Date:  06/29/2017   Name:  [name] [name]   DOB:  11/25/1990   MRN:  [id number]  PCP:  Smith, Kristi M, MD    Chief Complaint: Annual Exam (no pap, [facility])   History of Present Illness:  This is a 26 y.o. female with PMH ADHD, GAD, insomnia, acne, allergic rhinnitis who is presenting for CPE. She takes adderall and trazadone. Followed by Dr. Smith   Gum surgery in May for gum implants. Doing great.  She has 2 kids: 2 and 1 years old.  She is fasting today.   Complaints: cough x 2 days. She had a cold over the weekend - mostly better. Recently treated for CAP with Z-pack.  LMP: IUD x 2017. She does not have periods.  Contraception:  IUD Last pap: 2017. negative - Gets done at [facility].  Sexual history: Married. not currently active. Husband is her only sexual partner.  Immunizations: Needs flu shot. Otherwise, UTD Dentist: regularly. Flosses daily.   Eye: bl 20/20 Diet/Exercise: she exercises regularly 3 x week.  Eats a healthy diet. Cooks at home mostly.  Fam hx: Mother HTN; Father: Testicular cancer, heart disease, HLD, HTN; PGF heart disease.  Tobacco/alcohol/substance use: social drinker, former smoker - quit 4 years ago, no drug use.   Colonoscopy: 2009 in high school for "stomach problems". Negative   Review of Systems:  Review of Systems  Constitutional: Negative for chills, diaphoresis, fatigue and fever.  HENT: Negative for congestion, postnasal drip, rhinorrhea, sinus pressure, sneezing and sore throat.   Respiratory: Positive for cough. Negative for chest tightness, shortness of breath and wheezing.   Cardiovascular: Negative for chest pain and palpitations.  Gastrointestinal: Negative for abdominal pain, diarrhea, nausea and vomiting.  Genitourinary: Negative for decreased urine volume, difficulty urinating, dysuria, enuresis, flank pain, frequency, hematuria and  urgency.  Musculoskeletal: Negative for back pain.  Neurological: Negative for dizziness, weakness, light-headedness and headaches.  Psychiatric/Behavioral: Negative for sleep disturbance. The patient is nervous/anxious.     Patient Active Problem List   Diagnosis Date Noted  . Insomnia 12/09/2016  . ADHD (attention deficit hyperactivity disorder) 10/27/2014  . GAD (generalized anxiety disorder) 10/27/2014  . Other acne 05/03/2008  . Allergic rhinitis 01/27/2008    Prior to Admission medications   Medication Sig Start Date End Date Taking? Authorizing Provider  amphetamine-dextroamphetamine (ADDERALL) 20 MG tablet Take 1 tablet (20 mg total) by mouth 3 (three) times daily. 05/04/17  Yes Smith, Kristi M, MD  traZODone (DESYREL) 50 MG tablet Take 0.5-1 tablets (25-50 mg total) by mouth at bedtime. 11/18/16  Yes Smith, Kristi M, MD  albuterol (PROVENTIL HFA;VENTOLIN HFA) 108 (90 Base) MCG/ACT inhaler Inhale 1-2 puffs into the lungs every 6 (six) hours as needed for wheezing or shortness of breath. Patient not taking: Reported on 06/29/2017 02/24/17   Greene, Jeffrey R, MD  clonazePAM (KLONOPIN) 0.5 MG tablet Take 0.5-1 tablets (0.25-0.5 mg total) by mouth daily as needed for anxiety. Patient not taking: Reported on 06/29/2017 02/24/17   Smith, Kristi M, MD    Allergies  Allergen Reactions  . Ceclor [Cefaclor] Hives    Age 2 mild rash    Past Surgical History:  Procedure Laterality Date  . DILATION AND CURETTAGE OF UTERUS  01/04/16  . DILATION AND EVACUATION N/A 01/04/2016   Procedure: DILATATION AND EVACUATION;  Surgeon: David Lowe, MD;  Location: [facility];    Service: Gynecology;  Laterality: N/A;  . INDUCED ABORTION    . wisdom teeth removal Bilateral    2010    Social History  Substance Use Topics  . Smoking status: Former Smoker    Types: Cigarettes    Quit date: 08/15/2013  . Smokeless tobacco: Never Used  . Alcohol use No     Comment: socially, not with preg    Family  History  Problem Relation Age of Onset  . Hypertension Mother   . Heart disease Father   . Cancer Father        testicular  . Hypertension Father   . Hyperlipidemia Father   . Heart disease Paternal Grandfather     Medication list has been reviewed and updated.  Physical Examination:  Physical Exam  Constitutional: She is oriented to person, place, and time. She appears well-developed and well-nourished. No distress.  HENT:  Head: Normocephalic and atraumatic.  Mouth/Throat: Oropharynx is clear and moist.  Eyes: Pupils are equal, round, and reactive to light. Conjunctivae and EOM are normal.  Neck: Neck supple. No thyromegaly present.  Cardiovascular: Normal rate, regular rhythm and normal heart sounds.   No murmur heard. Pulmonary/Chest: Effort normal. She has wheezes in the right lower field. She has no rhonchi. She has no rales.  Abdominal: Soft. Bowel sounds are normal. There is no tenderness.  Musculoskeletal: Normal range of motion.  Neurological: She is alert and oriented to person, place, and time. She has normal reflexes.  Skin: Skin is warm and dry.  Psychiatric: She has a normal mood and affect. Her behavior is normal. Judgment and thought content normal.  Vitals reviewed.   BP 108/70   Pulse 90   Temp 98.4 F (36.9 C) (Oral)   Resp 16   Ht 5' 7" (1.702 m)   Wt 139 lb 9.6 oz (63.3 kg)   SpO2 99%   BMI 21.86 kg/m   Assessment and Plan:  1. Annual physical exam - Pt presents for annual exam. She needs form filled out for insurance. I will fill out and fax once labs return. She is UTD immunizations and PAP. Flu shot given. Anticipatory guidance provided.   2. Bronchospasm - albuterol (PROVENTIL HFA;VENTOLIN HFA) 108 (90 Base) MCG/ACT inhaler; Inhale 1-2 puffs into the lungs every 6 (six) hours as needed for wheezing or shortness of breath.  Dispense: 1 Inhaler; Refill: 0 - Stay well hydrated. OTC Delsym for cough. RC in 2-3 weeks if no improvement. Consider  imaging.   3. Flu vaccine need - Flu Vaccine QUAD 36+ mos IM  4. Screening, lipid - Lipid panel  5. Screening, anemia, deficiency, iron - CBC with Differential/Platelet  6. Screening for endocrine disorder - CMP14+EGFR - POCT urinalysis dipstick  Whitney , PA-C  [facility] 06/29/2017 11:24 AM   

## 2017-06-30 ENCOUNTER — Encounter: Payer: Self-pay | Admitting: Physician Assistant

## 2017-06-30 ENCOUNTER — Telehealth: Payer: Self-pay | Admitting: Physician Assistant

## 2017-06-30 NOTE — Telephone Encounter (Signed)
Pt needs to sign and date health screening form. Called patient and left vmail, paperwork will be at 102 in black bin.

## 2017-07-02 ENCOUNTER — Ambulatory Visit: Payer: BLUE CROSS/BLUE SHIELD | Admitting: Psychology

## 2017-07-07 ENCOUNTER — Ambulatory Visit: Payer: Self-pay | Admitting: Psychology

## 2017-07-07 ENCOUNTER — Ambulatory Visit: Payer: BLUE CROSS/BLUE SHIELD | Admitting: Psychology

## 2017-07-07 NOTE — Progress Notes (Signed)
Letter sent.

## 2017-08-10 ENCOUNTER — Encounter: Payer: Self-pay | Admitting: Family Medicine

## 2017-08-15 ENCOUNTER — Ambulatory Visit: Payer: Self-pay | Admitting: Family Medicine

## 2017-08-28 ENCOUNTER — Encounter: Payer: Self-pay | Admitting: Family Medicine

## 2017-08-28 ENCOUNTER — Ambulatory Visit (INDEPENDENT_AMBULATORY_CARE_PROVIDER_SITE_OTHER): Payer: BLUE CROSS/BLUE SHIELD | Admitting: Family Medicine

## 2017-08-28 ENCOUNTER — Other Ambulatory Visit: Payer: Self-pay

## 2017-08-28 VITALS — BP 118/70 | HR 102 | Temp 98.0°F | Resp 16 | Ht 68.5 in | Wt 174.0 lb

## 2017-08-28 DIAGNOSIS — F5104 Psychophysiologic insomnia: Secondary | ICD-10-CM

## 2017-08-28 DIAGNOSIS — F411 Generalized anxiety disorder: Secondary | ICD-10-CM | POA: Diagnosis not present

## 2017-08-28 DIAGNOSIS — J069 Acute upper respiratory infection, unspecified: Secondary | ICD-10-CM

## 2017-08-28 DIAGNOSIS — J9801 Acute bronchospasm: Secondary | ICD-10-CM

## 2017-08-28 DIAGNOSIS — F902 Attention-deficit hyperactivity disorder, combined type: Secondary | ICD-10-CM | POA: Diagnosis not present

## 2017-08-28 MED ORDER — AMPHETAMINE-DEXTROAMPHETAMINE 20 MG PO TABS: 20.0000 mg | ORAL_TABLET | Freq: Two times a day (BID) | ORAL | 0 refills | Status: DC

## 2017-08-28 MED ORDER — ALBUTEROL SULFATE HFA 108 (90 BASE) MCG/ACT IN AERS: 1.0000 | INHALATION_SPRAY | Freq: Four times a day (QID) | RESPIRATORY_TRACT | 0 refills | Status: DC | PRN

## 2017-08-28 NOTE — Progress Notes (Signed)
Subjective:    Patient ID: [name] CoyerSophia M [name], female    DOB: 06-Apr-1991, 26 y.o.   MRN: [id number]  08/28/2017  Anxiety (3 month follow-up )    HPI This 26 y.o. female presents for evaluation of ADHD.    On weekends, does not take Adderall at all.  During class, will take once daily.   Adderall made patient anxious when had to sit.   Clearly thinks needs to take Adderall before class.  Not engaged. Joking off.  Had no idea what to expect.   Very involved class.   No time to  Trazodone sparingly.   Hypersomnolence in the morning.  URI: no fever/chills/sweats. No sore throat.  No ear pain. +rhinorrhea and nasal congestion.  +cough; no sputum production; no SOB; no n/v/d.  Using Mucinex for one day.  Daughters also sick.  Exercising with yoga . BP Readings from Last 3 Encounters:  08/28/17 118/70  06/29/17 108/70  02/26/17 108/66   Wt Readings from Last 3 Encounters:  08/28/17 174 lb (78.9 kg)  06/29/17 139 lb 9.6 oz (63.3 kg)  02/26/17 135 lb (61.2 kg)   Immunization History  Administered Date(s) Administered  . Influenza,inj,Quad PF,6+ Mos 05/13/2016, 06/29/2017  . Influenza-Unspecified 06/15/2014  . Tdap 06/15/2014    Review of Systems  Constitutional: Negative for chills, diaphoresis, fatigue and fever.  HENT: Positive for congestion, postnasal drip, rhinorrhea and voice change. Negative for ear pain, sinus pressure, sinus pain and sore throat.   Eyes: Negative for visual disturbance.  Respiratory: Positive for cough. Negative for shortness of breath and wheezing.   Cardiovascular: Negative for chest pain, palpitations and leg swelling.  Gastrointestinal: Negative for abdominal pain, constipation, diarrhea, nausea and vomiting.  Endocrine: Negative for cold intolerance, heat intolerance, polydipsia, polyphagia and polyuria.  Neurological: Negative for dizziness, tremors, seizures, syncope, facial asymmetry, speech difficulty, weakness, light-headedness, numbness and  headaches.  Psychiatric/Behavioral: Negative for dysphoric mood, self-injury and sleep disturbance. The patient is nervous/anxious and is hyperactive.     Past Medical History:  Diagnosis Date  . ADHD (attention deficit hyperactivity disorder)   . Anxiety   . Panic attacks   . Vaginal Pap smear, abnormal    Past Surgical History:  Procedure Laterality Date  . DILATION AND CURETTAGE OF UTERUS  01/04/16  . DILATION AND EVACUATION N/A 01/04/2016   Procedure: DILATATION AND EVACUATION;  Surgeon: Candice Campavid Lowe, MD;  Location: [facility];  Service: Gynecology;  Laterality: N/A;  . GUM SURGERY  01/2017  . INDUCED ABORTION    . wisdom teeth removal Bilateral    2010   Allergies  Allergen Reactions  . Ceclor [Cefaclor] Hives    Age 50 mild rash   Current Outpatient Medications on File Prior to Visit  Medication Sig Dispense Refill  . clonazePAM (KLONOPIN) 1 MG tablet Take by mouth.    . Fluvoxamine Maleate 150 MG CP24 Take by mouth.     No current facility-administered medications on file prior to visit.    Social History   Socioeconomic History  . Marital status: Married    Spouse name: Not on file  . Number of children: Not on file  . Years of education: Not on file  . Highest education level: Not on file  Social Needs  . Financial resource strain: Not on file  . Food insecurity - worry: Not on file  . Food insecurity - inability: Not on file  . Transportation needs - medical: Not on file  . Transportation needs -  non-medical: Not on file  Occupational History  . Not on file  Tobacco Use  . Smoking status: Former Smoker    Types: Cigarettes    Last attempt to quit: 08/15/2013    Years since quitting: 4.0  . Smokeless tobacco: Never Used  Substance and Sexual Activity  . Alcohol use: No    Alcohol/week: 1.2 oz    Types: 2 Standard drinks or equivalent per week    Comment: socially, not with preg  . Drug use: No  . Sexual activity: Not Currently    Birth control/protection:  None  Other Topics Concern  . Not on file  Social History Narrative  . Not on file   Family History  Problem Relation Age of Onset  . Hypertension Mother   . Heart disease Father   . Cancer Father        testicular  . Hypertension Father   . Hyperlipidemia Father   . Heart disease Paternal Grandfather        Objective:    BP 118/70   Pulse (!) 102   Temp 98 F (36.7 C)   Resp 16   Ht 5' 8.5" (1.74 m)   Wt 174 lb (78.9 kg)   SpO2 98%   BMI 26.07 kg/m  Physical Exam  Constitutional: She is oriented to person, place, and time. She appears well-developed and well-nourished. No distress.  HENT:  Head: Atraumatic.  Right Ear: Tympanic membrane, external ear and ear canal normal.  Left Ear: Tympanic membrane, external ear and ear canal normal.  Nose: Mucosal edema and rhinorrhea present. Right sinus exhibits no maxillary sinus tenderness and no frontal sinus tenderness. Left sinus exhibits no maxillary sinus tenderness and no frontal sinus tenderness.  Mouth/Throat: Oropharynx is clear and moist.  Eyes: Conjunctivae and EOM are normal. Pupils are equal, round, and reactive to light.  Neck: Normal range of motion. Neck supple. Carotid bruit is not present. No thyromegaly present.  Cardiovascular: Normal rate, regular rhythm, normal heart sounds and intact distal pulses. Exam reveals no gallop and no friction rub.  No murmur heard. Pulmonary/Chest: Effort normal and breath sounds normal. She has no wheezes. She has no rales.  Lymphadenopathy:    She has no cervical adenopathy.  Neurological: She is alert and oriented to person, place, and time. No cranial nerve deficit.  Skin: Skin is warm and dry. No rash noted. She is not diaphoretic. No erythema. No pallor.  Psychiatric: She has a normal mood and affect. Her behavior is normal.   No results found. Depression screen [facility] 2/[facility] 08/28/2017 06/29/2017 02/26/2017 02/24/2017 02/06/2017  Decreased Interest 0 0 0 0 0  Down, Depressed,  Hopeless 0 0 0 0 0  PHQ - 2 Score 0 0 0 0 0   Fall Risk  08/28/2017 06/29/2017 02/26/2017 02/06/2017 11/18/2016  Falls in the past year? No No No No No        Assessment & Plan:   1. Attention deficit hyperactivity disorder (ADHD), combined type   2. GAD (generalized anxiety disorder)   3. Psychophysiological insomnia   4. Viral URI   5. Bronchospasm     Controlled attention deficit with hyperactivity.  Noncompliance with 3 times daily medication.  Also not using Adderall on weekends.  Struggling with real estate courses due to noncompliance with medication.  Encourage compliance with Adderall on days of courses.  If Adderall is causing anxiety, recommend decreasing Adderall 20 mg to 1/2 tablet twice daily.  May also use Adderall  20 mg 1/2 tablet 3 times daily as needed.  Generalized anxiety disorder stable at this time.  Continue exercise for stress management.  Currently sleeping well and not warranting trazodone therapy at bedtime.  New onset viral URI.  Recommend supportive care with rest, Mucinex twice daily, and albuterol inhaler 3 times a day scheduled for the next week due to underlying bronchospasm.  Consider also using Flonase or Afrin therapy for nasal congestion.  No orders of the defined types were placed in this encounter.  Meds ordered this encounter  Medications  . DISCONTD: amphetamine-dextroamphetamine (ADDERALL) 20 MG tablet    Sig: Take 1 tablet (20 mg total) by mouth 2 (two) times daily.    Dispense:  60 tablet    Refill:  0    DO NOT FILL FOR 60 DAYS AFTER PRESCRIBED.  Marland Kitchen DISCONTD: amphetamine-dextroamphetamine (ADDERALL) 20 MG tablet    Sig: Take 1 tablet (20 mg total) by mouth 2 (two) times daily.    Dispense:  60 tablet    Refill:  0    DO NOT FILL FOR 30 DAYS AFTER PRESCRIBED.  Marland Kitchen amphetamine-dextroamphetamine (ADDERALL) 20 MG tablet    Sig: Take 1 tablet (20 mg total) by mouth 2 (two) times daily.    Dispense:  60 tablet    Refill:  0  . albuterol  (PROVENTIL HFA;VENTOLIN HFA) 108 (90 Base) MCG/ACT inhaler    Sig: Inhale 1-2 puffs into the lungs every 6 (six) hours as needed for wheezing or shortness of breath.    Dispense:  1 Inhaler    Refill:  0    No Follow-up on file.   Blandon Offerdahl Paulita Fujita, M.D. [facility] at [facility] [address] [place], [place]  [address] [phone] phone [phone] fax

## 2017-08-28 NOTE — Patient Instructions (Addendum)
   Continue Mucinex. Use Albuterol inhaler three times daily for the next week and then as needed. Recommend Adderall 20mg  1/2-1 tablet twice daily especially on school days. Continue regular exercise for stress management.  IF you received an x-ray today, you will receive an invoice from [facility]. Please contact [facility] at [phone] with questions or concerns regarding your invoice.   IF you received labwork today, you will receive an invoice from [facility]. Please contact [facility] at [phone] with questions or concerns regarding your invoice.   Our billing staff will not be able to assist you with questions regarding bills from these companies.  You will be contacted with the lab results as soon as they are available. The fastest way to get your results is to activate your My Chart account. Instructions are located on the last page of this paperwork. If you have not heard from us regarding the results in 2 weeks, please contact this office.

## 2017-11-17 ENCOUNTER — Encounter: Payer: Self-pay | Admitting: Family Medicine

## 2017-11-25 ENCOUNTER — Encounter: Payer: Self-pay | Admitting: Family Medicine

## 2017-11-26 ENCOUNTER — Other Ambulatory Visit: Payer: Self-pay | Admitting: Family Medicine

## 2017-11-26 DIAGNOSIS — F902 Attention-deficit hyperactivity disorder, combined type: Secondary | ICD-10-CM

## 2017-11-26 NOTE — Telephone Encounter (Signed)
Pt  Requesting  Adderall  Refill  LOV 08-28-2017  [facility]  [place]

## 2017-11-27 MED ORDER — AMPHETAMINE-DEXTROAMPHETAMINE 20 MG PO TABS: 20.0000 mg | ORAL_TABLET | Freq: Two times a day (BID) | ORAL | 0 refills | Status: DC

## 2017-12-17 ENCOUNTER — Other Ambulatory Visit: Payer: Self-pay | Admitting: Family Medicine

## 2017-12-17 NOTE — Telephone Encounter (Signed)
clonazepam refill Last OV: 08/28/17 Last Refill:Noted on med list but no refill date says [facility]" Pharmacy:[facility]  PCP: Dr. Katrinka BlazingSmith

## 2017-12-18 NOTE — Telephone Encounter (Signed)
Pt called to check status of Rx.

## 2017-12-23 ENCOUNTER — Other Ambulatory Visit: Payer: Self-pay | Admitting: Family Medicine

## 2017-12-23 ENCOUNTER — Encounter: Payer: Self-pay | Admitting: Family Medicine

## 2017-12-24 NOTE — Telephone Encounter (Signed)
Klonopin 0.5 mg tablet refill request  LOV 08/28/17 with Dr. Katrinka BlazingSmith  [facility] [place]

## 2017-12-25 NOTE — Telephone Encounter (Signed)
Refill of Clonazep

## 2018-01-29 ENCOUNTER — Encounter: Payer: Self-pay | Admitting: Family Medicine

## 2018-02-03 ENCOUNTER — Encounter: Payer: Self-pay | Admitting: Family Medicine

## 2018-02-16 ENCOUNTER — Ambulatory Visit (INDEPENDENT_AMBULATORY_CARE_PROVIDER_SITE_OTHER): Payer: BLUE CROSS/BLUE SHIELD

## 2018-02-16 ENCOUNTER — Other Ambulatory Visit: Payer: Self-pay

## 2018-02-16 ENCOUNTER — Ambulatory Visit (INDEPENDENT_AMBULATORY_CARE_PROVIDER_SITE_OTHER): Payer: BLUE CROSS/BLUE SHIELD | Admitting: Physician Assistant

## 2018-02-16 ENCOUNTER — Encounter: Payer: Self-pay | Admitting: Physician Assistant

## 2018-02-16 VITALS — BP 120/80 | HR 100 | Temp 99.4°F | Ht 68.11 in | Wt 137.6 lb

## 2018-02-16 DIAGNOSIS — R05 Cough: Secondary | ICD-10-CM

## 2018-02-16 DIAGNOSIS — J189 Pneumonia, unspecified organism: Secondary | ICD-10-CM

## 2018-02-16 DIAGNOSIS — R059 Cough, unspecified: Secondary | ICD-10-CM

## 2018-02-16 DIAGNOSIS — J9801 Acute bronchospasm: Secondary | ICD-10-CM

## 2018-02-16 DIAGNOSIS — R509 Fever, unspecified: Secondary | ICD-10-CM

## 2018-02-16 DIAGNOSIS — J181 Lobar pneumonia, unspecified organism: Secondary | ICD-10-CM

## 2018-02-16 LAB — POCT CBC
Granulocyte percent: 86.9 %G — AB (ref 37–80)
HCT, POC: 37.4 % — AB (ref 37.7–47.9)
Hemoglobin: 12.5 g/dL (ref 12.2–16.2)
Lymph, poc: 1.1 (ref 0.6–3.4)
MCH, POC: 30.8 pg (ref 27–31.2)
MCHC: 33.3 g/dL (ref 31.8–35.4)
MCV: 92.5 fL (ref 80–97)
MID (cbc): 1 — AB (ref 0–0.9)
MPV: 7.3 fL (ref 0–99.8)
POC Granulocyte: 13.9 — AB (ref 2–6.9)
POC LYMPH PERCENT: 7.1 %L — AB (ref 10–50)
POC MID %: 6 %M (ref 0–12)
Platelet Count, POC: 219 10*3/uL (ref 142–424)
RBC: 4.05 M/uL (ref 4.04–5.48)
RDW, POC: 12.6 %
WBC: 16 10*3/uL — AB (ref 4.6–10.2)

## 2018-02-16 LAB — POC INFLUENZA A&B (BINAX/QUICKVUE)
Influenza A, POC: NEGATIVE
Influenza B, POC: NEGATIVE

## 2018-02-16 IMAGING — DX DG CHEST 2V
2 series · 2 of 2 positions shown · non-contrast
Comparison: [DATE]

CLINICAL DATA: Cough and fever

EXAM:
CHEST - 2 VIEW

[chest pa]
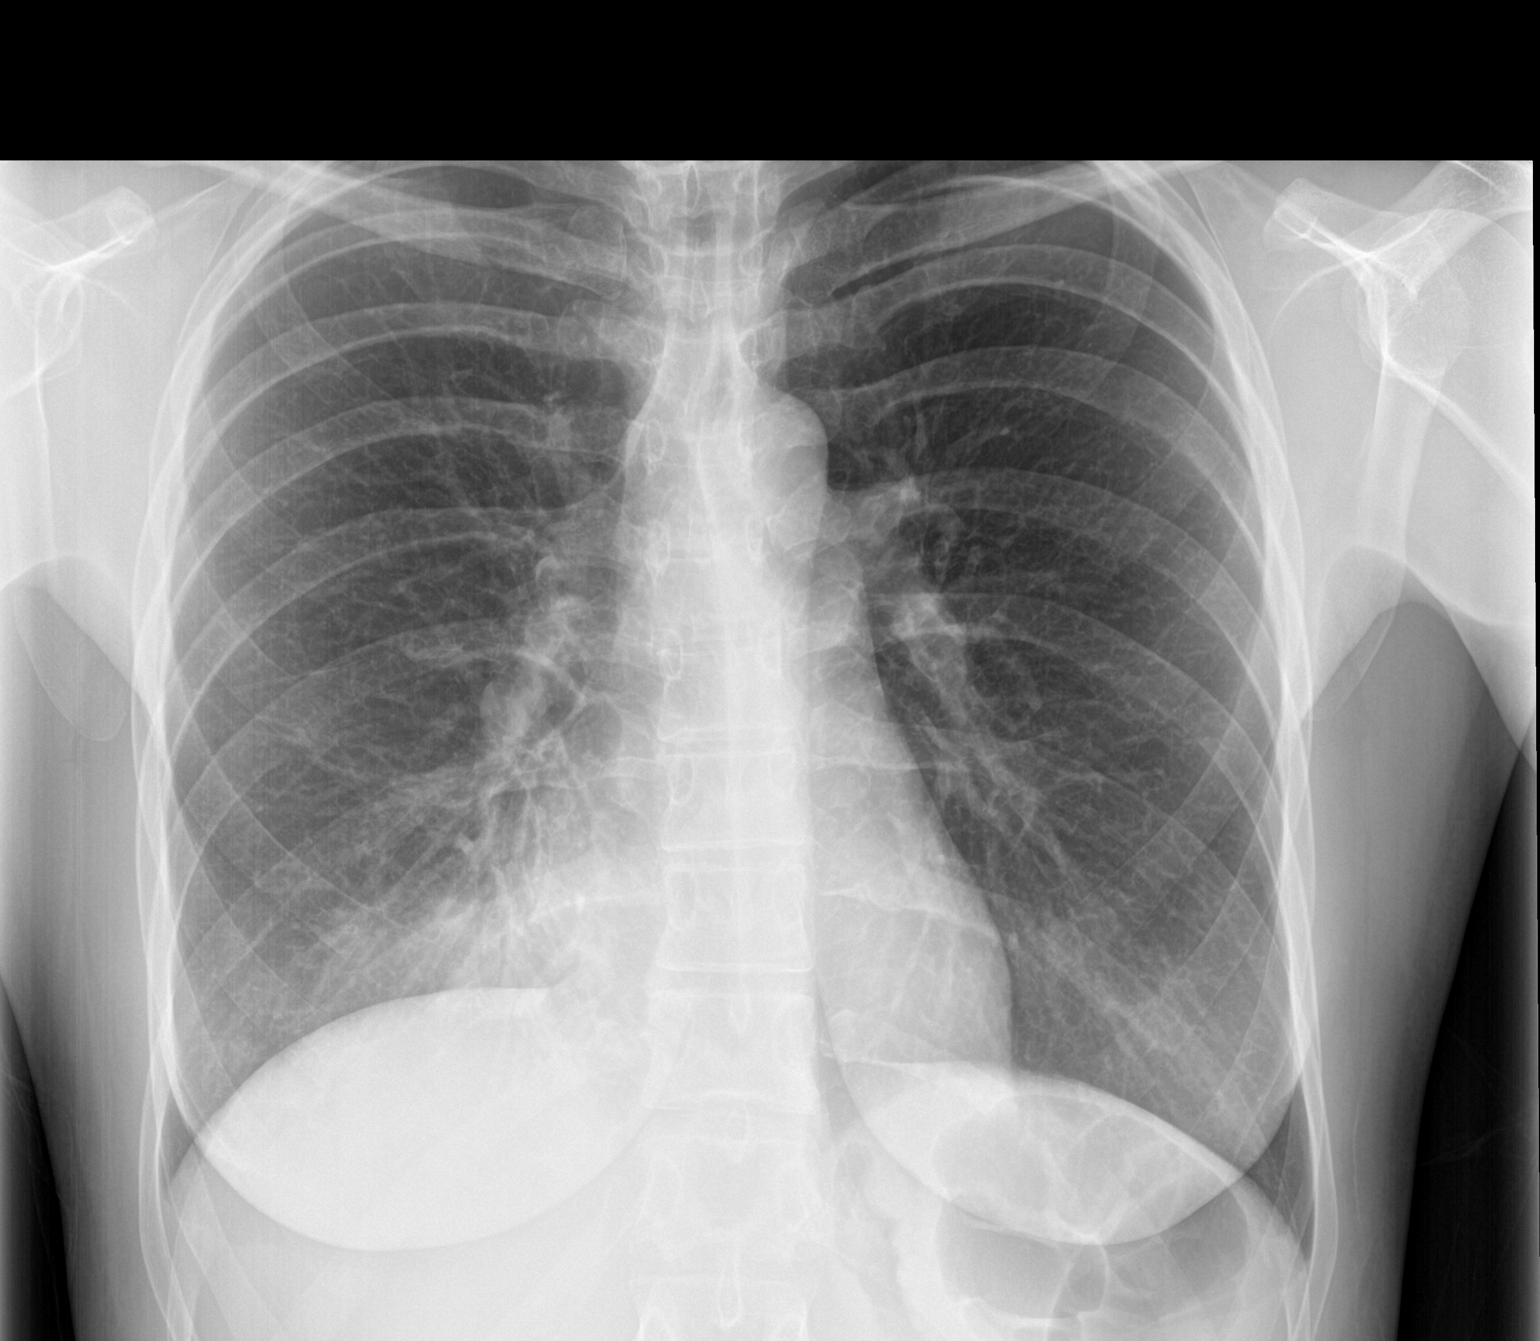

[chest lat]
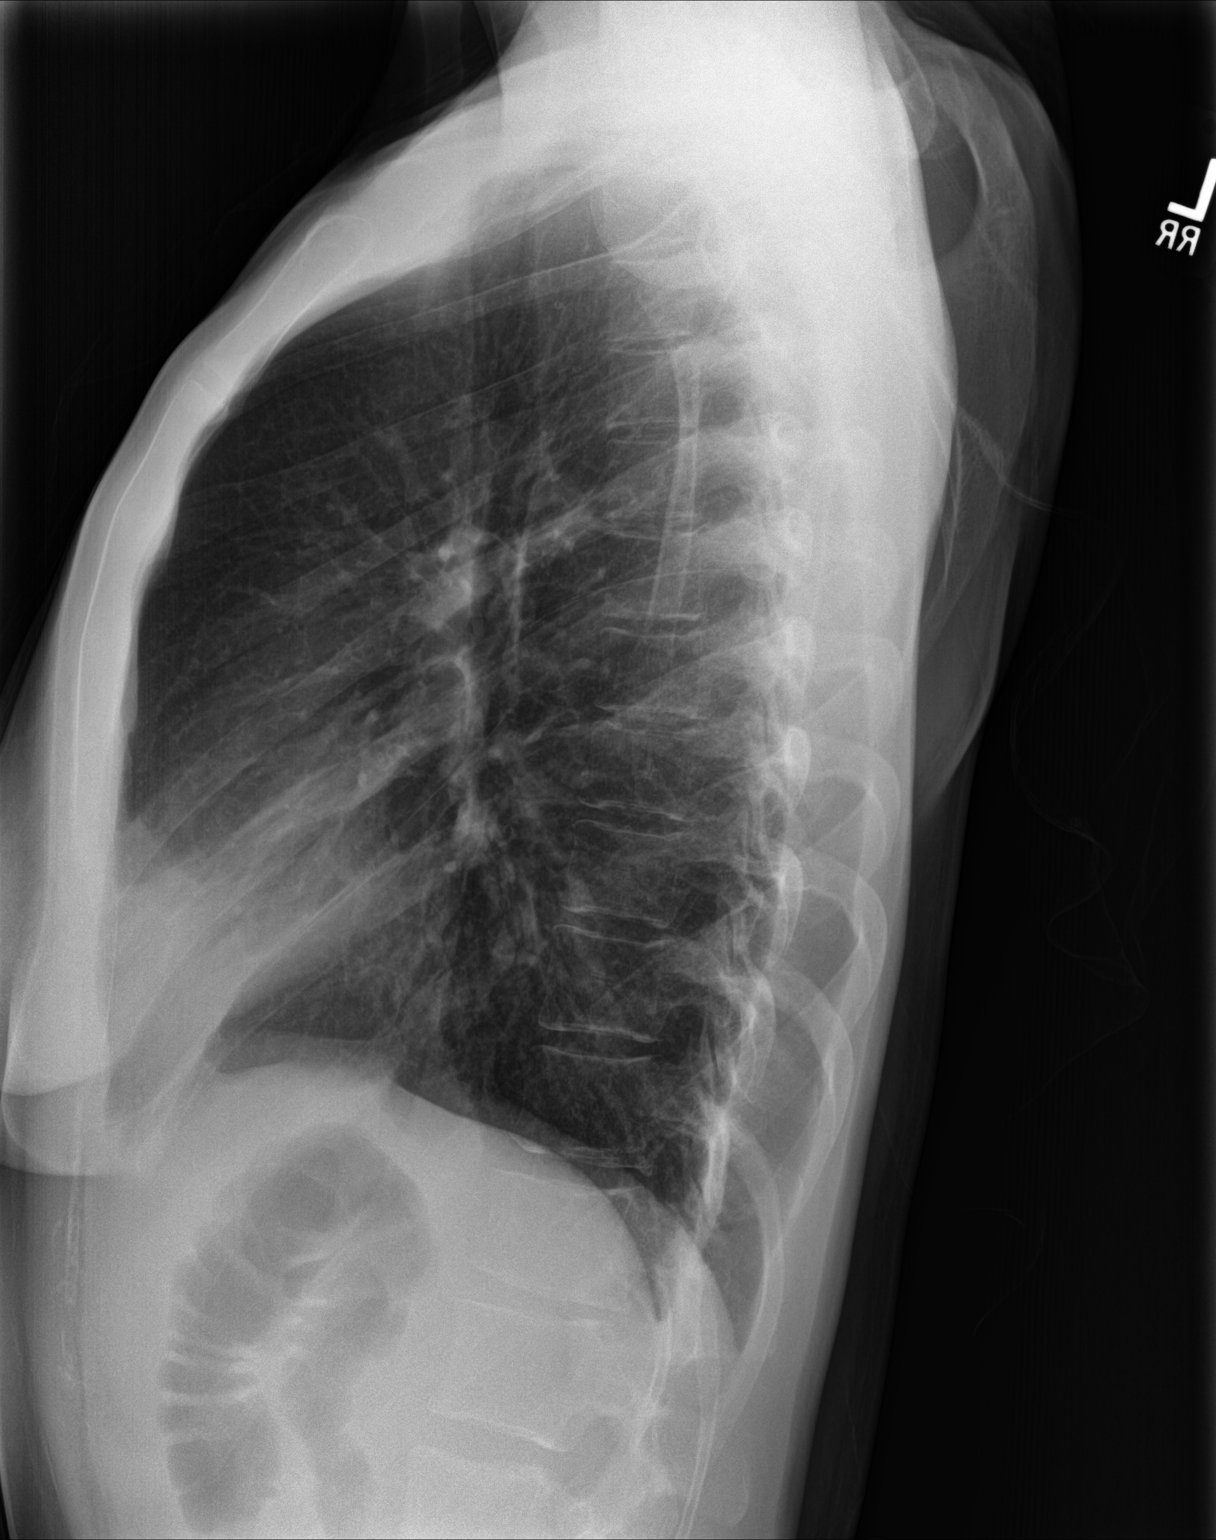

[2 of 2 positions shown; findings below may reference images not displayed]

FINDINGS: There is airspace consolidation in the medial segment right middle
lobe consistent with pneumonia. Lungs elsewhere clear. Heart size
and pulmonary vascularity are normal. No adenopathy. No bone
lesions.
IMPRESSION: Airspace consolidation consistent with pneumonia medial segment
right middle lobe. Lungs elsewhere clear. No adenopathy.

These results will be called to the ordering clinician or
representative by the Radiologist Assistant, and communication
documented in the PACS or zVision Dashboard.

## 2018-02-16 MED ORDER — HYDROCODONE-HOMATROPINE 5-1.5 MG/5ML PO SYRP: 5.0000 mL | ORAL_SOLUTION | Freq: Three times a day (TID) | ORAL | 0 refills | Status: DC | PRN

## 2018-02-16 MED ORDER — BENZONATATE 100 MG PO CAPS: 100.0000 mg | ORAL_CAPSULE | Freq: Three times a day (TID) | ORAL | 0 refills | Status: DC | PRN

## 2018-02-16 MED ORDER — ALBUTEROL SULFATE HFA 108 (90 BASE) MCG/ACT IN AERS: 1.0000 | INHALATION_SPRAY | Freq: Four times a day (QID) | RESPIRATORY_TRACT | 0 refills | Status: DC | PRN

## 2018-02-16 MED ORDER — AZITHROMYCIN 250 MG PO TABS: ORAL_TABLET | ORAL | 0 refills | Status: DC

## 2018-02-16 NOTE — Patient Instructions (Addendum)
- You have pneumonia. Please take antibiotic as prescribed.  - I recommend you rest, drink plenty of fluids, eat light meals including soups.  - You may use cough syrup at night for your cough and sore throat, Tessalon pearls during the day. Be aware that cough syrup can definitely make you drowsy and sleepy so do not drive or operate any heavy machinery if it is affecting you during the day.  - You may also use Tylenol 500-1000mg  every 6 hours and ibuprofen 400-600mg  every 8 hours as needed for pain and fever.    Community-Acquired Pneumonia, Adult Pneumonia is an infection of the lungs. One type of pneumonia can happen while a person is in a hospital. A different type can happen when a person is not in a hospital (community-acquired pneumonia). It is easy for this kind to spread from person to person. It can spread to you if you breathe near an infected person who coughs or sneezes. Some symptoms include:  A dry cough.  A wet (productive) cough.  Fever.  Sweating.  Chest pain.  Follow these instructions at home:  Take over-the-counter and prescription medicines only as told by your doctor. ? Only take cough medicine if you are losing sleep. ? If you were prescribed an antibiotic medicine, take it as told by your doctor. Do not stop taking the antibiotic even if you start to feel better.  Sleep with your head and neck raised (elevated). You can do this by putting a few pillows under your head, or you can sleep in a recliner.  Do not use tobacco products. These include cigarettes, chewing tobacco, and e-cigarettes. If you need help quitting, ask your doctor.  Drink enough water to keep your pee (urine) clear or pale yellow. A shot (vaccine) can help prevent pneumonia. Shots are often suggested for:  People older than 28 years of age.  People older than 28 years of age: ? Who are having cancer treatment. ? Who have long-term (chronic) lung disease. ? Who have problems with their  body's defense system (immune system).  You may also prevent pneumonia if you take these actions:  Get the flu (influenza) shot every year.  Go to the dentist as often as told.  Wash your hands often. If soap and water are not available, use hand sanitizer.  Contact a doctor if:  You have a fever.  You lose sleep because your cough medicine does not help. Get help right away if:  You are short of breath and it gets worse.  You have more chest pain.  Your sickness gets worse. This is very serious if: ? You are an older adult. ? Your body's defense system is weak.  You cough up blood. This information is not intended to replace advice given to you by your health care provider. Make sure you discuss any questions you have with your health care provider. Document Released: 02/18/2008 Document Revised: 02/07/2016 Document Reviewed: 12/27/2014 Elsevier Interactive Patient Education  2018 [employer]    IF you received an x-ray today, you will receive an invoice from [facility]. Please contact [facility] at [phone] with questions or concerns regarding your invoice.   IF you received labwork today, you will receive an invoice from [facility]. Please contact [facility] at [phone] with questions or concerns regarding your invoice.   Our billing staff will not be able to assist you with questions regarding bills from these companies.  You will be contacted with the lab results as soon  as they are available. The fastest way to get your results is to activate your My Chart account. Instructions are located on the last page of this paperwork. If you have not heard from [place] regarding the results in 2 weeks, please contact this office.

## 2018-02-16 NOTE — Progress Notes (Signed)
MRN: [id number] DOB: Aug 03, 1991  Subjective:   [name] [name] is a 27 y.o. female presenting for chief complaint of Fever (flu like symptoms with diarrhea) .  Reports 1.5 wk history of illness. Started out with cough. Felt better. Then 2 days ago, felt way worse. Has productive cough, fever, right sided chest pain when coughing, mild SOB, chills, diaphoresis, and body aches. Had few episodes of vomiting. Has tried tylenol, ibuprofen, and albuterol inhaler with no full relief.  Denies sinus congestion, sinus pain, ear pain, sore throat and wheezing, nausea, vomiting and abdominal pain. Has not had sick contact with anyone. No history of seasonal allergies, no history of asthma but has had to use albuterol inhaler during prior resp illness. Had pneumonia last year.  Patient has had flu shot this season. Former smoker, quit in 2014. No recent hospitalization or antibiotic use.  [name] [name] has a current medication list which includes the following prescription(s): albuterol, amphetamine-dextroamphetamine, clonazepam, and fluvoxamine maleate. Also is allergic to ceclor [cefaclor].  [name] [name]  has a past medical history of ADHD (attention deficit hyperactivity disorder), Anxiety, Panic attacks, and Vaginal Pap smear, abnormal. Also  has a past surgical history that includes wisdom teeth removal (Bilateral); Induced abortion; Dilation and curettage of uterus (01/04/16); Dilation and evacuation (N/A, 01/04/2016); and Gum surgery (01/2017).   Objective:   Vitals: BP 120/80 (BP Location: Left Arm, Patient Position: Sitting, Cuff Size: Normal)   Pulse 100   Temp 99.4 F (37.4 C) (Oral)   Ht 5' 8.11" (1.73 m)   Wt 137 lb 9.6 oz (62.4 kg)   SpO2 100%   BMI 20.85 kg/m   Physical Exam  Constitutional: She is oriented to person, place, and time. She appears well-developed and well-nourished. No distress.  Appears like she does not feel well sitting on exam table but overall is very interactive and  talkative.   HENT:  Head: Normocephalic and atraumatic.  Right Ear: Tympanic membrane, external ear and ear canal normal.  Left Ear: Tympanic membrane, external ear and ear canal normal.  Nose: Nose normal.  Mouth/Throat: Uvula is midline, oropharynx is clear and moist and mucous membranes are normal. No tonsillar exudate.  Eyes: Conjunctivae are normal.  Neck: Normal range of motion.  Cardiovascular: Normal rate, regular rhythm and normal heart sounds.  Pulmonary/Chest: Effort normal and breath sounds normal. No accessory muscle usage. No respiratory distress. She has no decreased breath sounds. She has no wheezes. She has no rhonchi. She has no rales. She exhibits tenderness (with palpation of b/l anterior costal cartilage).  Difficult lung exam due to coughing.   Lymphadenopathy:       Head (right side): No submental, no submandibular, no tonsillar, no preauricular, no posterior auricular and no occipital adenopathy present.       Head (left side): No submental, no submandibular, no tonsillar, no preauricular, no posterior auricular and no occipital adenopathy present.    She has no cervical adenopathy.       Right: No supraclavicular adenopathy present.       Left: No supraclavicular adenopathy present.  Neurological: She is alert and oriented to person, place, and time.  Skin: Skin is warm and dry.  Psychiatric: She has a normal mood and affect.  Vitals reviewed.   Results for orders placed or performed in visit on 02/16/18 (from the past 24 hour(s))  POC Influenza A&B(BINAX/QUICKVUE)     Status: Normal   Collection Time: 02/16/18 11:46 AM  Result Value Ref Range  Influenza A, POC Negative Negative   Influenza B, POC Negative Negative  POCT CBC     Status: Abnormal   Collection Time: 02/16/18 12:24 PM  Result Value Ref Range   WBC 16.0 (A) 4.6 - 10.2 K/uL   Lymph, poc 1.1 0.6 - 3.4   POC LYMPH PERCENT 7.1 (A) 10 - 50 %L   MID (cbc) 1.0 (A) 0 - 0.9   POC MID % 6.0 0 - 12 %M     POC Granulocyte 13.9 (A) 2 - 6.9   Granulocyte percent 86.9 (A) 37 - 80 %G   RBC 4.05 4.04 - 5.48 M/uL   Hemoglobin 12.5 12.2 - 16.2 g/dL   HCT, POC 16.1 (A) 09.6 - 47.9 %   MCV 92.5 80 - 97 fL   MCH, POC 30.8 27 - 31.2 pg   MCHC 33.3 31.8 - 35.4 g/dL   RDW, POC 04.5 %   Platelet Count, POC 219 142 - 424 K/uL   MPV 7.3 0 - 99.8 fL   Dg Chest 2 View  Result Date: 02/16/2018 CLINICAL DATA:  Cough and fever EXAM: CHEST - 2 VIEW COMPARISON:  February 24, 2017 FINDINGS: There is airspace consolidation in the medial segment right middle lobe consistent with pneumonia. Lungs elsewhere clear. Heart size and pulmonary vascularity are normal. No adenopathy. No bone lesions. IMPRESSION: Airspace consolidation consistent with pneumonia medial segment right middle lobe. Lungs elsewhere clear. No adenopathy. These results will be called to the ordering clinician or representative by the Radiologist Assistant, and communication documented in the PACS or zVision Dashboard. Electronically Signed   By: Bretta Bang III M.D.   On: 02/16/2018 12:28    Assessment and Plan :  1. Pneumonia of right middle lobe due to infectious organism (HCC) Pt is in NAD. Vitals stable. Flu neg. CXR consistent with RML pneumonia. Rec outpt tx. Follow up in 24-48 hrs if no improvement in sx, otherwise, plan to f/u in 4 weeks. Given strict ED precautions.  - azithromycin (ZITHROMAX) 250 MG tablet; Take 2 tabs PO x 1 dose, then 1 tab PO QD x 4 days  Dispense: 6 tablet; Refill: 0  2. Fever, unspecified fever cause - POC Influenza A&B(BINAX/QUICKVUE)  3. Cough - DG Chest 2 View; Future - POCT CBC - HYDROcodone-homatropine (HYCODAN) 5-1.5 MG/5ML syrup; Take 5 mLs by mouth every 8 (eight) hours as needed for cough.  Dispense: 75 mL; Refill: 0 - benzonatate (TESSALON) 100 MG capsule; Take 1-2 capsules (100-200 mg total) by mouth 3 (three) times daily as needed for cough.  Dispense: 40 capsule; Refill: 0  4. Bronchospasm No  wheezing noted on exam but pt reports needing refill for albuterol inhaler to use if she has intermittent wheezing.  - albuterol (PROVENTIL HFA;VENTOLIN HFA) 108 (90 Base) MCG/ACT inhaler; Inhale 1-2 puffs into the lungs every 6 (six) hours as needed for wheezing or shortness of breath.  Dispense: 1 Inhaler; Refill: 0  Side effects, risks, benefits, and alternatives of the medications and treatment plan prescribed today were discussed, and patient expressed understanding of the instructions given. No barriers to understanding were identified. Red flags discussed in detail. Pt expressed understanding regarding what to do in case of emergency/urgent symptoms.  Benjiman Core, PA-C  Primary Care at [facility] 02/16/2018 12:35 PM

## 2018-02-17 ENCOUNTER — Encounter: Payer: Self-pay | Admitting: Physician Assistant

## 2018-03-03 ENCOUNTER — Ambulatory Visit: Payer: Self-pay | Admitting: Emergency Medicine

## 2018-03-04 ENCOUNTER — Ambulatory Visit (INDEPENDENT_AMBULATORY_CARE_PROVIDER_SITE_OTHER): Payer: BLUE CROSS/BLUE SHIELD | Admitting: Family Medicine

## 2018-03-04 ENCOUNTER — Encounter: Payer: Self-pay | Admitting: Family Medicine

## 2018-03-04 VITALS — BP 128/80 | HR 100 | Temp 99.2°F | Ht 67.0 in | Wt 137.8 lb

## 2018-03-04 DIAGNOSIS — F411 Generalized anxiety disorder: Secondary | ICD-10-CM | POA: Diagnosis not present

## 2018-03-04 DIAGNOSIS — F902 Attention-deficit hyperactivity disorder, combined type: Secondary | ICD-10-CM | POA: Diagnosis not present

## 2018-03-04 DIAGNOSIS — H6981 Other specified disorders of Eustachian tube, right ear: Secondary | ICD-10-CM

## 2018-03-04 DIAGNOSIS — Z5181 Encounter for therapeutic drug level monitoring: Secondary | ICD-10-CM

## 2018-03-04 MED ORDER — AMPHETAMINE-DEXTROAMPHETAMINE 20 MG PO TABS: 20.0000 mg | ORAL_TABLET | Freq: Two times a day (BID) | ORAL | 0 refills | Status: DC

## 2018-03-04 MED ORDER — FLUTICASONE PROPIONATE 50 MCG/ACT NA SUSP: 1.0000 | Freq: Two times a day (BID) | NASAL | 6 refills | Status: DC

## 2018-03-04 MED ORDER — LORAZEPAM 0.5 MG PO TABS: 0.5000 mg | ORAL_TABLET | Freq: Every day | ORAL | 0 refills | Status: DC | PRN

## 2018-03-04 NOTE — Patient Instructions (Addendum)
1. Nasal saline washes and pseudo phenyluephrine (oral decongestant) if not better in a week or you start having fever (100.4 or greater) than call in and I will send in an antibiotic    IF you received an x-ray today, you will receive an invoice from [facility]. Please contact [facility] at [phone] with questions or concerns regarding your invoice.   IF you received labwork today, you will receive an invoice from [facility]. Please contact [facility] at [phone] with questions or concerns regarding your invoice.   Our billing staff will not be able to assist you with questions regarding bills from these companies.  You will be contacted with the lab results as soon as they are available. The fastest way to get your results is to activate your My Chart account. Instructions are located on the last page of this paperwork. If you have not heard from us regarding the results in 2 weeks, please contact this office.     Eustachian Tube Dysfunction The eustachian tube connects the middle ear to the back of the nose. It regulates air pressure in the middle ear by allowing air to move between the ear and nose. It also helps to drain fluid from the middle ear space. When the eustachian tube does not function properly, air pressure, fluid, or both can build up in the middle ear. Eustachian tube dysfunction can affect one or both ears. What are the causes? This condition happens when the eustachian tube becomes blocked or cannot open normally. This may result from:  Ear infections.  Colds and other upper respiratory infections.  Allergies.  Irritation, such as from cigarette smoke or acid from the stomach coming up into the esophagus (gastroesophageal reflux).  Sudden changes in air pressure, such as from descending in an airplane.  Abnormal growths in the nose or throat, such as nasal polyps, tumors, or enlarged tissue at the back of the throat (adenoids).  What  increases the risk? This condition may be more likely to develop in people who smoke and people who are overweight. Eustachian tube dysfunction may also be more likely to develop in children, especially children who have:  Certain birth defects of the mouth, such as cleft palate.  Large tonsils and adenoids.  What are the signs or symptoms? Symptoms of this condition may include:  A feeling of fullness in the ear.  Ear pain.  Clicking or popping noises in the ear.  Ringing in the ear.  Hearing loss.  Loss of balance.  Symptoms may get worse when the air pressure around you changes, such as when you travel to an area of high elevation or fly on an airplane. How is this diagnosed? This condition may be diagnosed based on:  Your symptoms.  A physical exam of your ear, nose, and throat.  Tests, such as those that measure: ? The movement of your eardrum (tympanogram). ? Your hearing (audiometry).  How is this treated? Treatment depends on the cause and severity of your condition. If your symptoms are mild, you may be able to relieve your symptoms by moving air into ("popping") your ears. If you have symptoms of fluid in your ears, treatment may include:  Decongestants.  Antihistamines.  Nasal sprays or ear drops that contain medicines that reduce swelling (steroids).  In some cases, you may need to have a procedure to drain the fluid in your eardrum (myringotomy). In this procedure, a small tube is placed in the eardrum to:  Drain the fluid.  Restore  the air in the middle ear space.  Follow these instructions at home:  Take over-the-counter and prescription medicines only as told by your health care provider.  Use techniques to help pop your ears as recommended by your health care provider. These may include: ? Chewing gum. ? Yawning. ? Frequent, forceful swallowing. ? Closing your mouth, holding your nose closed, and gently blowing as if you are trying to blow air  out of your nose.  Do not do any of the following until your health care provider approves: ? Travel to high altitudes. ? Fly in airplanes. ? Work in a Estate agent or room. ? Scuba dive.  Keep your ears dry. Dry your ears completely after showering or bathing.  Do not smoke.  Keep all follow-up visits as told by your health care provider. This is important. Contact a health care provider if:  Your symptoms do not go away after treatment.  Your symptoms come back after treatment.  You are unable to pop your ears.  You have: ? A fever. ? Pain in your ear. ? Pain in your head or neck. ? Fluid draining from your ear.  Your hearing suddenly changes.  You become very dizzy.  You lose your balance. This information is not intended to replace advice given to you by your health care provider. Make sure you discuss any questions you have with your health care provider. Document Released: 09/28/2015 Document Revised: 02/07/2016 Document Reviewed: 09/20/2014 Elsevier Interactive Patient Education  [employer].

## 2018-03-04 NOTE — Progress Notes (Signed)
6/20/20198:24 AM  [name] [name] 27-Jan-1991, 27 y.o. female [id number]  Chief Complaint  Patient presents with  . Follow-up    ADHD, medication is doing well    HPI:   Patient is a 27 y.o. 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, has a second one coming up soon Today emotionally not doing to well, had a fight with her husband Their relationship has been difficult for a while, last year they did couples counseling She is thinking about returning to counseling herself She does feel physically safe Takes bzd very prn Pmp reviewed, stimulant monthly, bzd last rx a year ago #20 She is recovering from recent PNA, cough and SOB resolved, fatigue still lingering She however continues to have ear pressure and nasal congestion, denies any rhinorrhea or sinus pain, denies any PND or sore throat, denies any sneezing, itchy watery eyes or ears or throat  Fall Risk  03/04/2018 02/16/2018 08/28/2017 06/29/2017 02/26/2017  Falls in the past year? No No No No No     Depression screen [facility] 2/9 03/04/2018 02/16/2018 08/28/2017  Decreased Interest 0 0 0  Down, Depressed, Hopeless 0 0 0  PHQ - 2 Score 0 0 0    Allergies  Allergen Reactions  . Ceclor [Cefaclor] Hives    Age 43 mild rash    Prior to Admission medications   Medication Sig Start Date End Date Taking? Authorizing Provider  albuterol (PROVENTIL HFA;VENTOLIN HFA) 108 (90 Base) MCG/ACT inhaler Inhale 1-2 puffs into the lungs every 6 (six) hours as needed for wheezing or shortness of breath. 02/16/18  Yes Barnett Abu, [place] D, PA-C  amphetamine-dextroamphetamine (ADDERALL) 20 MG tablet Take 1 tablet (20 mg  total) by mouth 2 (two) times daily. 11/27/17  Yes Ethelda Chick, MD  clonazePAM (KLONOPIN) 1 MG tablet Take by mouth.   Yes [provider]    Past Medical History:  Diagnosis Date  . ADHD (attention deficit hyperactivity disorder)   . Anxiety   . Panic attacks   . Vaginal Pap smear, abnormal     Past Surgical History:  Procedure Laterality Date  . DILATION AND CURETTAGE OF UTERUS  01/04/16  . DILATION AND EVACUATION N/A 01/04/2016   Procedure: DILATATION AND EVACUATION;  Surgeon: Candice Camp, MD;  Location: [facility];  Service: Gynecology;  Laterality: N/A;  . GUM SURGERY  01/2017  . INDUCED ABORTION    . wisdom teeth removal Bilateral    2010    Social History   Tobacco Use  . Smoking status: Former Smoker    Types: Cigarettes    Last attempt to quit: 08/15/2013    Years since quitting: 4.5  . Smokeless tobacco: Never Used  Substance Use Topics  . Alcohol use: No    Alcohol/week: 1.2 oz    Types: 2 Standard drinks or equivalent per week    Comment: socially, not with preg    Family History  Problem Relation Age of Onset  . Hypertension Mother   . Heart disease Father   . Cancer Father        testicular  . Hypertension Father   . Hyperlipidemia Father   .  Heart disease Paternal Grandfather     Review of Systems  Constitutional: Positive for malaise/fatigue. Negative for chills, fever and weight loss.  Respiratory: Negative for cough and shortness of breath.   Cardiovascular: Negative for chest pain, palpitations and leg swelling.  Gastrointestinal: Negative for abdominal pain, nausea and vomiting.  Psychiatric/Behavioral: Negative for depression. The patient is nervous/anxious. The patient does not have insomnia.    Per hpi  OBJECTIVE:  Blood pressure 128/80, pulse 100, temperature 99.2 F (37.3 C), temperature source Oral, height 5\' 7"  (1.702 m), weight 137 lb 12.8 oz (62.5 kg), SpO2 99 %.  Wt Readings from Last 3 Encounters:  03/04/18 137 lb  12.8 oz (62.5 kg)  02/16/18 137 lb 9.6 oz (62.4 kg)  08/28/17 174 lb (78.9 kg)   BP Readings from Last 3 Encounters:  03/04/18 128/80  02/16/18 120/80  08/28/17 118/70    Physical Exam  Constitutional: She is oriented to person, place, and time. She appears well-developed and well-nourished.  HENT:  Head: Normocephalic and atraumatic.  Right Ear: Hearing, tympanic membrane, external ear and ear canal normal.  Left Ear: Hearing, tympanic membrane, external ear and ear canal normal.  Mouth/Throat: Oropharynx is clear and moist. No oropharyngeal exudate.  Eyes: Pupils are equal, round, and reactive to light. EOM are normal. No scleral icterus.  Neck: Neck supple.  Cardiovascular: Normal rate, regular rhythm and normal heart sounds. Exam reveals no gallop and no friction rub.  No murmur heard. Pulmonary/Chest: Effort normal and breath sounds normal. She has no wheezes. She has no rales.  Musculoskeletal: She exhibits no edema.  Lymphadenopathy:    She has no cervical adenopathy.  Neurological: She is alert and oriented to person, place, and time.  Skin: Skin is warm and dry.  Psychiatric: She has a normal mood and affect.  Nursing note and vitals reviewed.   ASSESSMENT and PLAN  1. Attention deficit hyperactivity disorder (ADHD), combined type 3. Encounter for medication monitoring Stable, continue current medication. PMP appropriate. Patient unable to complete UDS, will need at next visit. - amphetamine-dextroamphetamine (ADDERALL) 20 MG tablet; Take 1 tablet (20 mg total) by mouth 2 (two) times daily.  2. GAD (generalized anxiety disorder) Overall under control, currently affected by relationship conflicts. Encouraged return to counseling. A small rx for bzd given.  - LORazepam (ATIVAN) 0.5 MG tablet; Take 1 tablet (0.5 mg total) by mouth daily as needed for anxiety.  4. Dysfunction of right eustachian tube Discussed supportive measures, new meds r/se/b and RTC precautions.  Patient educational handout given. - fluticasone (FLONASE) 50 MCG/ACT nasal spray; Place 1 spray into both nostrils 2 (two) times daily.  Return in about 1 month (around 04/01/2018).    Myles LippsIrma M Santiago, MD Primary Care at [facility] [address] [place], [place] [address] Ph.  [phone] Fax [phone]

## 2018-03-08 ENCOUNTER — Encounter: Payer: Self-pay | Admitting: Family Medicine

## 2018-03-14 ENCOUNTER — Encounter: Payer: Self-pay | Admitting: Family Medicine

## 2018-03-31 ENCOUNTER — Other Ambulatory Visit: Payer: Self-pay

## 2018-03-31 ENCOUNTER — Encounter: Payer: Self-pay | Admitting: Family Medicine

## 2018-03-31 ENCOUNTER — Ambulatory Visit (INDEPENDENT_AMBULATORY_CARE_PROVIDER_SITE_OTHER): Payer: BLUE CROSS/BLUE SHIELD | Admitting: Family Medicine

## 2018-03-31 VITALS — BP 108/60 | HR 100 | Temp 98.6°F | Resp 17 | Ht 67.5 in | Wt 135.6 lb

## 2018-03-31 DIAGNOSIS — Z5181 Encounter for therapeutic drug level monitoring: Secondary | ICD-10-CM | POA: Diagnosis not present

## 2018-03-31 DIAGNOSIS — J301 Allergic rhinitis due to pollen: Secondary | ICD-10-CM | POA: Diagnosis not present

## 2018-03-31 DIAGNOSIS — F902 Attention-deficit hyperactivity disorder, combined type: Secondary | ICD-10-CM

## 2018-03-31 MED ORDER — AMPHETAMINE-DEXTROAMPHETAMINE 20 MG PO TABS: 20.0000 mg | ORAL_TABLET | Freq: Two times a day (BID) | ORAL | 0 refills | Status: DC

## 2018-03-31 MED ORDER — CETIRIZINE HCL 10 MG PO TABS
10.0000 mg | ORAL_TABLET | Freq: Every day | ORAL | 11 refills | Status: DC
Start: 2018-03-31 — End: 2018-11-27

## 2018-03-31 NOTE — Progress Notes (Signed)
Chief Complaint  Patient presents with  . ? sinus infection with right ear pain x 3-4 days, taking muc  . Medication Refill    adderall    HPI   otalgia Pt with right ear pain  Feels head congestion Eyes are crusted shut in the morning Her teeth hurt She states that she feels like she has been taking mucinex daily She and taking dayquil She reports that she had a history of pneumonia in 02/16/2018 She reports that she sounds hoarse  ADD Pt has a diagnosis of ADD/ADHD Pt is taking Adderall 20 mg twice daily and sometimes she gets sleepy on the medication Pt reports that she drinks coke all day Pt is able to sleep at night and denies unintentional weight loss Pt  denies mood swings Pt skips doses when not working or on weekends    Past Medical History:  Diagnosis Date  . ADHD (attention deficit hyperactivity disorder)   . Anxiety   . Panic attacks   . Vaginal Pap smear, abnormal     Current Outpatient Medications  Medication Sig Dispense Refill  . albuterol (PROVENTIL HFA;VENTOLIN HFA) 108 (90 Base) MCG/ACT inhaler Inhale 1-2 puffs into the lungs every 6 (six) hours as needed for wheezing or shortness of breath. 1 Inhaler 0  . amphetamine-dextroamphetamine (ADDERALL) 20 MG tablet Take 1 tablet (20 mg total) by mouth 2 (two) times daily. 60 tablet 0  . fluticasone (FLONASE) 50 MCG/ACT nasal spray Place 1 spray into both nostrils 2 (two) times daily. 16 g 6  . LORazepam (ATIVAN) 0.5 MG tablet Take 1 tablet (0.5 mg total) by mouth daily as needed for anxiety. 30 tablet 0  . cetirizine (ZYRTEC) 10 MG tablet Take 1 tablet (10 mg total) by mouth daily. 30 tablet 11   No current facility-administered medications for this visit.     Allergies:  Allergies  Allergen Reactions  . Ceclor [Cefaclor] Hives    Age 27 mild rash    Past Surgical History:  Procedure Laterality Date  . DILATION AND CURETTAGE OF UTERUS  01/04/16  . DILATION AND EVACUATION N/A 01/04/2016   Procedure: DILATATION AND EVACUATION;  Surgeon: Candice Camp, MD;  Location: [facility];  Service: Gynecology;  Laterality: N/A;  . GUM SURGERY  01/2017  . INDUCED ABORTION    . wisdom teeth removal Bilateral    2010    Social History   Socioeconomic History  . Marital status: Married    Spouse name: Not on file  . Number of children: Not on file  . Years of education: Not on file  . Highest education level: Not on file  Occupational History  . Not on file  Social Needs  . Financial resource strain: Not on file  . Food insecurity:    Worry: Not on file    Inability: Not on file  . Transportation needs:    Medical: Not on file    Non-medical: Not on file  Tobacco Use  . Smoking status: Former Smoker    Types: Cigarettes    Last attempt to quit: 08/15/2013    Years since quitting: 4.6  . Smokeless tobacco: Never Used  Substance and Sexual Activity  . Alcohol use: No    Alcohol/week: 1.2 oz    Types: 2 Standard drinks or equivalent per week    Comment: socially, not with preg  . Drug use: No  . Sexual activity: Not Currently    Birth control/protection: None  Lifestyle  . Physical activity:  Days per week: Not on file    Minutes per session: Not on file  . Stress: Not on file  Relationships  . Social connections:    Talks on phone: Not on file    Gets together: Not on file    Attends religious service: Not on file    Active member of club or organization: Not on file    Attends meetings of clubs or organizations: Not on file    Relationship status: Not on file  Other Topics Concern  . Not on file  Social History Narrative  . Not on file    Family History  Problem Relation Age of Onset  . Hypertension Mother   . Heart disease Father   . Cancer Father        testicular  . Hypertension Father   . Hyperlipidemia Father   . Heart disease Paternal Grandfather      ROS Review of Systems See HPI Constitution: No fevers or chills No malaise No  diaphoresis Skin: No rash or itching Eyes: no blurry vision, no double vision GU: no dysuria or hematuria Neuro: no dizziness or headaches  all others reviewed and negative   Objective: Vitals:   03/31/18 1634  BP: 108/60  Pulse: 100  Resp: 17  Temp: 98.6 F (37 C)  TempSrc: Oral  SpO2: 100%  Weight: 135 lb 9.6 oz (61.5 kg)  Height: 5' 7.5" (1.715 m)    Physical Exam  General: alert, oriented, in NAD Head: normocephalic, atraumatic, no sinus tenderness Eyes: EOM intact, no scleral icterus or conjunctival injection Ears: TM clear bilaterally Nose: mucosa nonerythematous, nonedematous Throat: no pharyngeal exudate or erythema Lymph: no posterior auricular, submental or cervical lymph adenopathy Heart: normal rate, normal sinus rhythm, no murmurs Lungs: clear to auscultation bilaterally, no wheezing    Assessment and Plan Thayer was seen today for ? sinus infection with right ear pain x 3-4 days, taking muc and medication refill.  Diagnoses and all orders for this visit:  Attention deficit hyperactivity disorder (ADHD), combined type -     Drug Screen, Urine -     amphetamine-dextroamphetamine (ADDERALL) 20 MG tablet; Take 1 tablet (20 mg total) by mouth 2 (two) times daily Stable, continue  Current adderall dose pmp aware reviewed  Encounter for medication monitoring -     Drug Screen, Urine  Seasonal allergic rhinitis due to pollen Advised zyrtec  -     cetirizine (ZYRTEC) 10 MG tablet; Take 1 tablet (10 mg total) by mouth daily.     Zoe A Stallings

## 2018-03-31 NOTE — Patient Instructions (Addendum)
IF you received an x-ray today, you will receive an invoice from  [facility]. Please contact [facility] at [phone] with questions or concerns regarding your invoice.   IF you received labwork today, you will receive an invoice from [facility]. Please contact [facility] at [phone] with questions or concerns regarding your invoice.   Our billing staff will not be able to assist you with questions regarding bills from these companies.  You will be contacted with the lab results as soon as they are available. The fastest way to get your results is to activate your My Chart account. Instructions are located on the last page of this paperwork. If you have not heard from us regarding the results in 2 weeks, please contact this office.     Cholesterol Cholesterol is a white, waxy, fat-like substance that is needed by the human body in small amounts. The liver makes all the cholesterol we need. Cholesterol is carried from the liver by the blood through the blood vessels. Deposits of cholesterol (plaques) may build up on blood vessel (artery) walls. Plaques make the arteries narrower and stiffer. Cholesterol plaques increase the risk for heart attack and stroke. You cannot feel your cholesterol level even if it is very high. The only way to know that it is high is to have a blood test. Once you know your cholesterol levels, you should keep a record of the test results. Work with your health care provider to keep your levels in the desired range. What do the results mean?  Total cholesterol is a rough measure of all the cholesterol in your blood.  LDL (low-density lipoprotein) is the "bad" cholesterol. This is the type that causes plaque to build up on the artery walls. You want this level to be low.  HDL (high-density lipoprotein) is the "good" cholesterol because it cleans the arteries and carries the LDL away. You want this level to be high.  Triglycerides are fat that  the body can either burn for energy or store. High levels are closely linked to heart disease. What are the desired levels of cholesterol?  Total cholesterol below 200.  LDL below 100 for people who are at risk, below 70 for people at very high risk.  HDL above 40 is good. A level of 60 or higher is considered to be protective against heart disease.  Triglycerides below 150. How can I lower my cholesterol? Diet Follow your diet program as told by your health care provider.  Choose fish or white meat chicken and [place], roasted or baked. Limit fatty cuts of red meat, fried foods, and processed meats, such as sausage and lunch meats.  Eat lots of fresh fruits and vegetables.  Choose whole grains, beans, pasta, potatoes, and cereals.  Choose olive oil, corn oil, or canola oil, and use only small amounts.  Avoid butter, mayonnaise, shortening, or palm kernel oils.  Avoid foods with trans fats.  Drink skim or nonfat milk and eat low-fat or nonfat yogurt and cheeses. Avoid whole milk, cream, ice cream, egg yolks, and full-fat cheeses.  Healthier desserts include angel food cake, ginger snaps, animal crackers, hard candy, popsicles, and low-fat or nonfat frozen yogurt. Avoid pastries, cakes, pies, and cookies.  Exercise  Follow your exercise program as told by your health care provider. A regular program: ? Helps to decrease LDL and raise HDL. ? Helps with weight control.  Do things that increase your activity level, such as gardening, walking, and taking the stairs.  Ask your health care provider about ways that you can be more active in your daily life.  Medicine  Take over-the-counter and prescription medicines only as told by your health care provider. ? Medicine may be prescribed by your health care provider to help lower cholesterol and decrease the risk for heart disease. This is usually done if diet and exercise have failed to bring down cholesterol levels. ? If you have  several risk factors, you may need medicine even if your levels are normal.  This information is not intended to replace advice given to you by your health care provider. Make sure you discuss any questions you have with your health care provider. Document Released: 05/27/2001 Document Revised: 03/29/2016 Document Reviewed: 03/01/2016 Elsevier Interactive Patient Education  [employer].

## 2018-04-02 ENCOUNTER — Ambulatory Visit: Payer: Self-pay | Admitting: Family Medicine

## 2018-04-28 ENCOUNTER — Other Ambulatory Visit: Payer: Self-pay | Admitting: Family Medicine

## 2018-04-28 ENCOUNTER — Encounter: Payer: Self-pay | Admitting: Family Medicine

## 2018-04-28 DIAGNOSIS — F902 Attention-deficit hyperactivity disorder, combined type: Secondary | ICD-10-CM

## 2018-04-28 NOTE — Telephone Encounter (Signed)
Adderall refill Last Refill:04/01/18 # 60 Last OV: 03/04/18 PCP: Dr Collie SiadZoe [name] Pharmacy: [facility] [place], [place]

## 2018-05-05 NOTE — Telephone Encounter (Signed)
Patient picked up medication on 05/03/18

## 2018-05-23 ENCOUNTER — Ambulatory Visit (INDEPENDENT_AMBULATORY_CARE_PROVIDER_SITE_OTHER): Payer: BLUE CROSS/BLUE SHIELD

## 2018-05-23 ENCOUNTER — Ambulatory Visit (HOSPITAL_COMMUNITY)
Admission: EM | Admit: 2018-05-23 | Discharge: 2018-05-23 | Disposition: A | Discharge status: Home / Self Care | Payer: BLUE CROSS/BLUE SHIELD | Attending: Urgent Care | Admitting: Urgent Care

## 2018-05-23 ENCOUNTER — Encounter (HOSPITAL_COMMUNITY): Payer: Self-pay

## 2018-05-23 ENCOUNTER — Encounter: Payer: Self-pay | Admitting: Family Medicine

## 2018-05-23 DIAGNOSIS — R0602 Shortness of breath: Secondary | ICD-10-CM | POA: Diagnosis not present

## 2018-05-23 DIAGNOSIS — R05 Cough: Secondary | ICD-10-CM

## 2018-05-23 DIAGNOSIS — J181 Lobar pneumonia, unspecified organism: Secondary | ICD-10-CM | POA: Diagnosis not present

## 2018-05-23 DIAGNOSIS — J9801 Acute bronchospasm: Secondary | ICD-10-CM | POA: Diagnosis not present

## 2018-05-23 DIAGNOSIS — R079 Chest pain, unspecified: Secondary | ICD-10-CM

## 2018-05-23 DIAGNOSIS — R059 Cough, unspecified: Secondary | ICD-10-CM

## 2018-05-23 DIAGNOSIS — J189 Pneumonia, unspecified organism: Secondary | ICD-10-CM

## 2018-05-23 IMAGING — DX DG CHEST 2V
2 series · 2 of 2 positions shown · non-contrast
Comparison: [DATE]

CLINICAL DATA: Cough for 1 week, fever and chills.

EXAM:
CHEST - 2 VIEW

[chest pa]
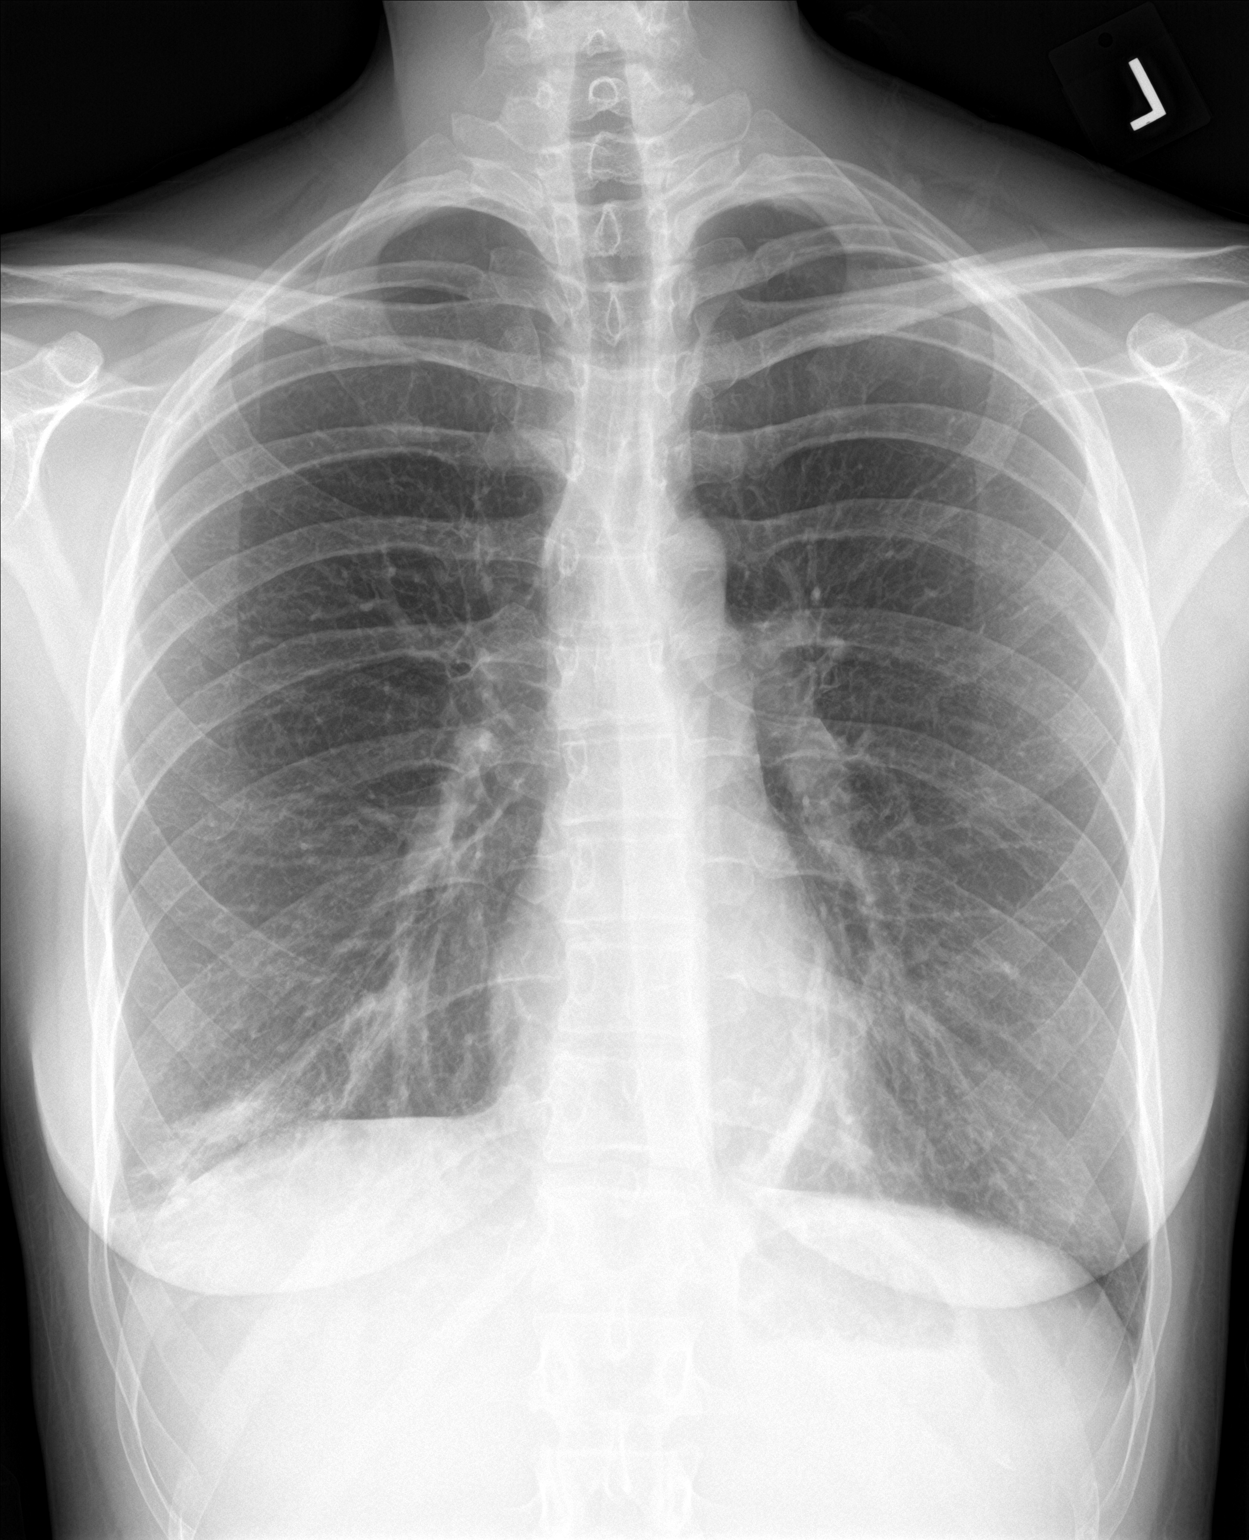

[chest lat]
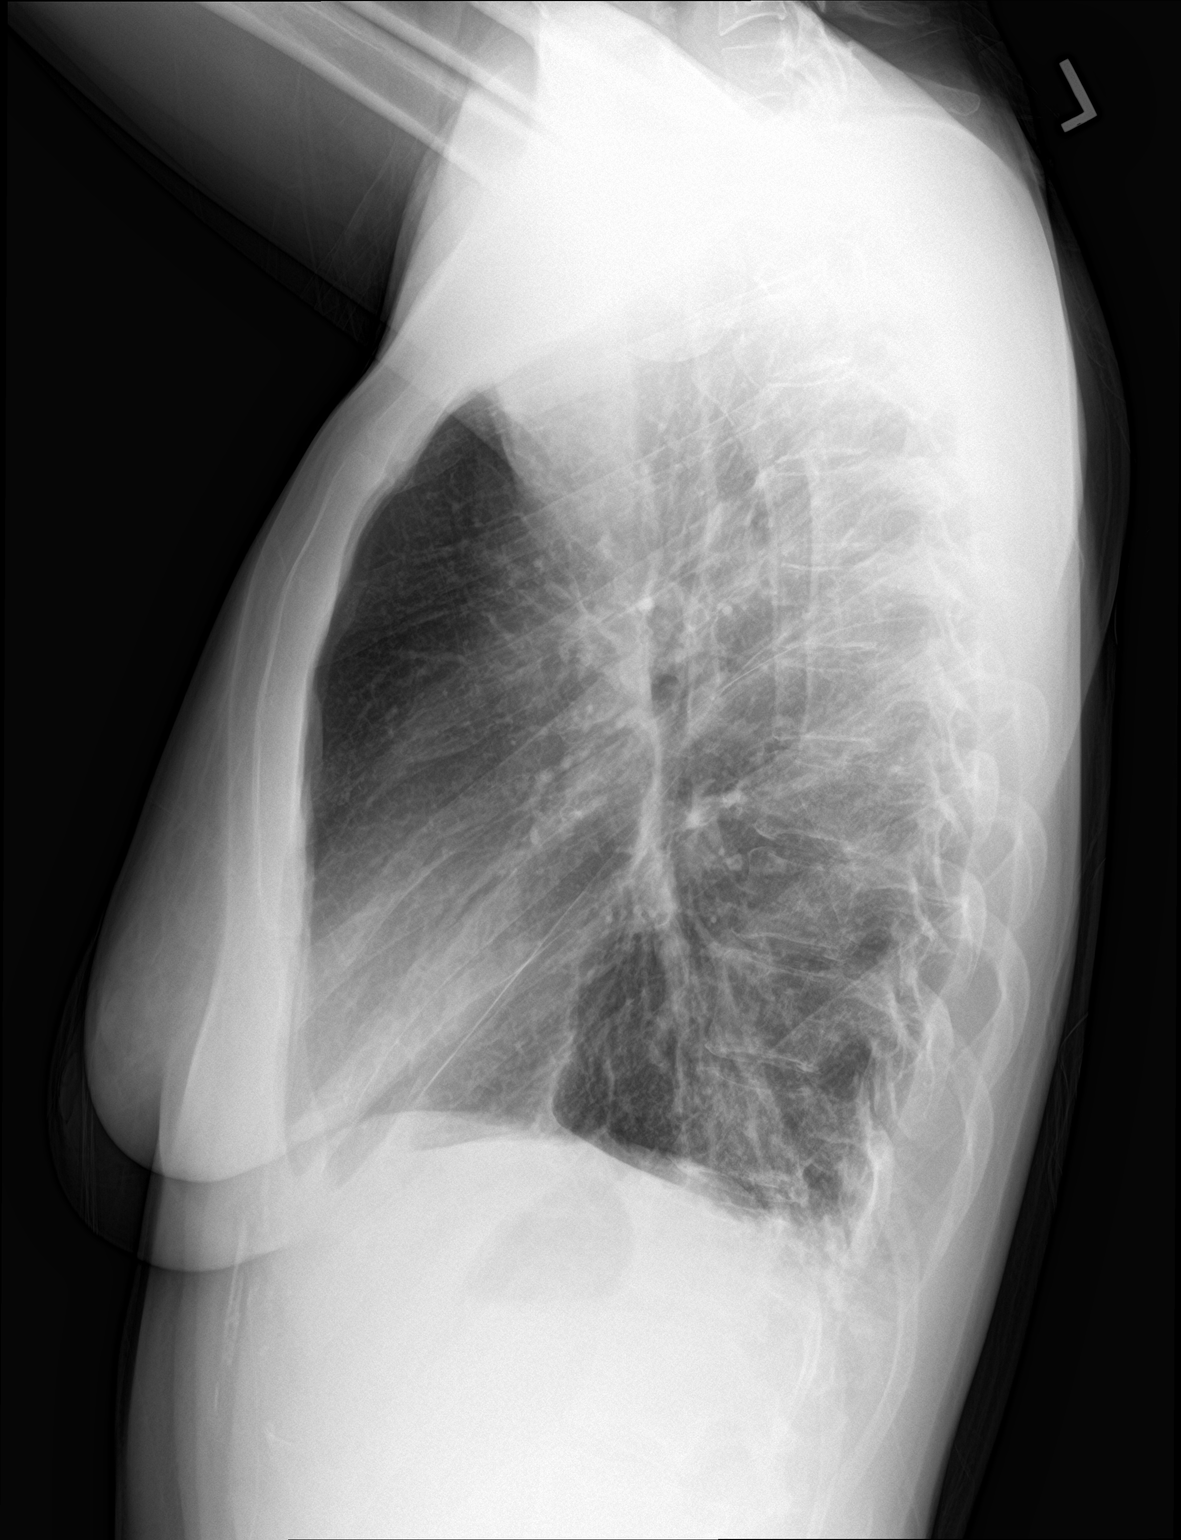

[2 of 2 positions shown; findings below may reference images not displayed]

FINDINGS: Cardiomediastinal silhouette is normal. Mediastinal contours appear
intact.

There is no evidence of pneumothorax. Patchy right lower lobe
airspace consolidation. Small right pleural effusion cannot be
excluded.

Osseous structures are without acute abnormality. Soft tissues are
grossly normal.
IMPRESSION: Patchy right lower lobe airspace consolidation concerning for
pneumonia.

Small right pleural effusion cannot be excluded.

## 2018-05-23 MED ORDER — HYDROCODONE-HOMATROPINE 5-1.5 MG/5ML PO SYRP: 5.0000 mL | ORAL_SOLUTION | Freq: Every evening | ORAL | 0 refills | Status: DC | PRN

## 2018-05-23 MED ORDER — ALBUTEROL SULFATE HFA 108 (90 BASE) MCG/ACT IN AERS: 1.0000 | INHALATION_SPRAY | Freq: Four times a day (QID) | RESPIRATORY_TRACT | 0 refills | Status: DC | PRN

## 2018-05-23 MED ORDER — LEVOFLOXACIN 750 MG PO TABS: 750.0000 mg | ORAL_TABLET | Freq: Every day | ORAL | 0 refills | Status: DC

## 2018-05-23 MED ORDER — PREDNISONE 20 MG PO TABS: ORAL_TABLET | ORAL | 0 refills | Status: DC

## 2018-05-23 NOTE — ED Provider Notes (Signed)
MRN: [id number] DOB: Oct 23, 1990  Subjective:   [name] [name] is a 27 y.o. female presenting for 5 day history of severe persistent productive cough, fever, chills, body aches.  Cough is eliciting chest soreness and chest pain.  Patient was seen at primary care [place] on 02/16/2018, had a chest x-ray done that showed pneumonia of the medial segment of right middle lobe.  Patient was prescribed azithromycin to manage this.  Today, she reports that she had mild improvement for a couple of weeks only after taking azithromycin.  States that she try to get more but was lost to follow-up.  She has been using her albuterol inhaler which can help with some of her shortness of breath.  But overall she feels much worse than she did in June.  No current facility-administered medications for this encounter.   Current Outpatient Medications:  .  albuterol (PROVENTIL HFA;VENTOLIN HFA) 108 (90 Base) MCG/ACT inhaler, Inhale 1-2 puffs into the lungs every 6 (six) hours as needed for wheezing or shortness of breath., Disp: 1 Inhaler, Rfl: 0 .  amphetamine-dextroamphetamine (ADDERALL) 20 MG tablet, TAKE 1 TABLET BY MOUTH TWICE DAILY., Disp: 60 tablet, Rfl: 0 .  cetirizine (ZYRTEC) 10 MG tablet, Take 1 tablet (10 mg total) by mouth daily., Disp: 30 tablet, Rfl: 11 .  fluticasone (FLONASE) 50 MCG/ACT nasal spray, Place 1 spray into both nostrils 2 (two) times daily., Disp: 16 g, Rfl: 6 .  LORazepam (ATIVAN) 0.5 MG tablet, Take 1 tablet (0.5 mg total) by mouth daily as needed for anxiety., Disp: 30 tablet, Rfl: 0   Allergies  Allergen Reactions  . Ceclor [Cefaclor] Hives    Age 76 mild rash    Past Medical History:  Diagnosis Date  . ADHD (attention deficit hyperactivity disorder)   . Anxiety   . Panic attacks   . Vaginal Pap smear, abnormal      Past Surgical History:  Procedure Laterality Date  . DILATION AND CURETTAGE OF UTERUS  01/04/16  . DILATION AND EVACUATION N/A 01/04/2016   Procedure: DILATATION  AND EVACUATION;  Surgeon: Candice Camp, MD;  Location: [facility];  Service: Gynecology;  Laterality: N/A;  . GUM SURGERY  01/2017  . INDUCED ABORTION    . wisdom teeth removal Bilateral    2010    Immunization History  Administered Date(s) Administered  . Influenza,inj,Quad PF,6+ Mos 05/13/2016, 06/29/2017  . Influenza-Unspecified 06/15/2014  . Tdap 06/15/2014     Family History  Problem Relation Age of Onset  . Hypertension Mother   . Heart disease Father   . Cancer Father        testicular  . Hypertension Father   . Hyperlipidemia Father   . Heart disease Paternal Grandfather      Objective:   Vitals: BP 113/77 (BP Location: Left Arm)   Pulse 74   Temp 99.2 F (37.3 C) (Oral)   Resp 19   LMP  (LMP Unknown)   SpO2 97%   Physical Exam  Constitutional: She is oriented to person, place, and time. She appears well-developed and well-nourished.  HENT:  Mouth/Throat: Oropharynx is clear and moist.  Cardiovascular: Normal rate, regular rhythm, normal heart sounds and intact distal pulses. Exam reveals no gallop and no friction rub.  No murmur heard. Pulmonary/Chest: Effort normal. No stridor. No respiratory distress. She has no wheezes. She has rales (right sided mid-lower lung fields).  Neurological: She is alert and oriented to person, place, and time.  Skin: Skin is warm and dry.  Psychiatric: She has a normal mood and affect.   Dg Chest 2 View  Result Date: 05/23/2018 CLINICAL DATA:  Cough for 1 week, fever and chills. EXAM: CHEST - 2 VIEW COMPARISON:  02/16/2018 FINDINGS: Cardiomediastinal silhouette is normal. Mediastinal contours appear intact. There is no evidence of pneumothorax. Patchy right lower lobe airspace consolidation. Small right pleural effusion cannot be excluded. Osseous structures are without acute abnormality. Soft tissues are grossly normal. IMPRESSION: Patchy right lower lobe airspace consolidation concerning for pneumonia. Small right pleural effusion  cannot be excluded. Electronically Signed   By: Ted Mcalpine M.D.   On: 05/23/2018 11:06    Assessment and Plan :   Community acquired pneumonia of right middle lobe of lung (HCC)  Cough  Shortness of breath  Bronchospasm - Plan: albuterol (PROVENTIL HFA;VENTOLIN HFA) 108 (90 Base) MCG/ACT inhaler  Chest pain, unspecified type  Start levofloxacin to address persistent community-acquired pneumonia.  Use prednisone course and Hycodan, albuterol for cough, chest congestion mucus plugging. Counseled patient on potential for adverse effects with medications prescribed today, patient verbalized understanding.  Repeat chest x-ray in 3 to 4 weeks to ensure resolution.   Wallis Bamberg, PA-C 05/23/18 1121

## 2018-05-23 NOTE — ED Triage Notes (Signed)
Pt presents with pneumonia symptoms; ongoing fever, severe and persistent cough, chills, body aches and chest soreness from coughing.

## 2018-05-23 NOTE — Discharge Instructions (Addendum)
Hydrate well with at least 2 liters (64 ounces) of water daily. For sore throat try using a honey-based tea. Use 3 teaspoons of honey with juice squeezed from half lemon. Place shaved pieces of ginger into 1/2-1 cup of water and warm over stove top. Then mix the ingredients and repeat every 4 hours as needed.  

## 2018-05-24 ENCOUNTER — Telehealth: Payer: Self-pay | Admitting: Family Medicine

## 2018-05-24 ENCOUNTER — Other Ambulatory Visit: Payer: Self-pay | Admitting: Physician Assistant

## 2018-05-24 DIAGNOSIS — R05 Cough: Secondary | ICD-10-CM

## 2018-05-24 DIAGNOSIS — J189 Pneumonia, unspecified organism: Secondary | ICD-10-CM

## 2018-05-24 DIAGNOSIS — R059 Cough, unspecified: Secondary | ICD-10-CM

## 2018-05-24 MED ORDER — HYDROCODONE-HOMATROPINE 5-1.5 MG/5ML PO SYRP: 5.0000 mL | ORAL_SOLUTION | Freq: Every evening | ORAL | 0 refills | Status: DC | PRN

## 2018-05-24 MED ORDER — LEVOFLOXACIN 750 MG PO TABS: 750.0000 mg | ORAL_TABLET | Freq: Once | ORAL | 0 refills | Status: AC

## 2018-05-24 NOTE — Telephone Encounter (Signed)
Copied from CRM [id number]. Topic: Quick Communication - See Telephone Encounter >> May 24, 2018  2:19 PM Jens Som A wrote: CRM for notification. See Telephone encounter for: 05/24/18.Patient's husband called to see if Dr. Creta Levin could give his wife a refferal for a pulumonary doctor.  Please advise. [phone]

## 2018-05-24 NOTE — Telephone Encounter (Signed)
Copied from CRM [id number]. Topic: Quick Communication - See Telephone Encounter >> May 24, 2018 11:44 AM [name] [name] wrote: CRM for notification. See Telephone encounter for: 05/24/18.  Pt's spouse called in to follow up on mychart message sent. Pt was seen in UC and spilled all medication prescribed. Pt is requesting a refill on medication.   Please assist further.

## 2018-05-25 NOTE — Telephone Encounter (Signed)
Call pt husband and informed him that referral has been placed, he verbalized understanding.

## 2018-05-28 ENCOUNTER — Ambulatory Visit: Payer: Self-pay | Admitting: Psychology

## 2018-05-28 ENCOUNTER — Other Ambulatory Visit: Payer: Self-pay | Admitting: Family Medicine

## 2018-05-28 DIAGNOSIS — F902 Attention-deficit hyperactivity disorder, combined type: Secondary | ICD-10-CM

## 2018-05-28 NOTE — Telephone Encounter (Signed)
Adderall refill Last OV:03/31/18 Last refill:04/28/18 #60/0 refill YNW:[id number]:[facility] [place] - [address] [phone] (Phone) [phone] (Fax)

## 2018-05-29 ENCOUNTER — Other Ambulatory Visit: Payer: Self-pay | Admitting: Family Medicine

## 2018-05-29 DIAGNOSIS — F902 Attention-deficit hyperactivity disorder, combined type: Secondary | ICD-10-CM

## 2018-05-31 ENCOUNTER — Encounter: Payer: Self-pay | Admitting: Family Medicine

## 2018-06-04 ENCOUNTER — Encounter

## 2018-06-04 ENCOUNTER — Ambulatory Visit: Payer: Self-pay | Admitting: Family Medicine

## 2018-06-09 ENCOUNTER — Ambulatory Visit (INDEPENDENT_AMBULATORY_CARE_PROVIDER_SITE_OTHER): Payer: BLUE CROSS/BLUE SHIELD | Admitting: Psychology

## 2018-06-09 DIAGNOSIS — F411 Generalized anxiety disorder: Secondary | ICD-10-CM | POA: Diagnosis not present

## 2018-06-22 ENCOUNTER — Ambulatory Visit: Payer: Self-pay | Admitting: Psychology

## 2018-06-29 ENCOUNTER — Telehealth: Payer: Self-pay | Admitting: Family Medicine

## 2018-06-29 DIAGNOSIS — F902 Attention-deficit hyperactivity disorder, combined type: Secondary | ICD-10-CM

## 2018-06-29 NOTE — Telephone Encounter (Signed)
Copied from CRM [id number]. Topic: Quick Communication - Rx Refill/Question >> Jun 29, 2018  4:30 PM Arlyss Gandy, NT wrote: Medication: amphetamine-dextroamphetamine (ADDERALL) 20 MG tablet   Pt scheduled for Dr. Creta Levin first available, 08/06/18.  Has the patient contacted their pharmacy? Yes.   (Agent: If no, request that the patient contact the pharmacy for the refill.) (Agent: If yes, when and what did the pharmacy advise?)  Preferred Pharmacy (with phone number or street name): [facility] - [place], [place] - [address] [phone] (Phone) [phone] (Fax)    Agent: Please be advised that RX refills may take up to 3 business days. We ask that you follow-up with your pharmacy.

## 2018-06-29 NOTE — Telephone Encounter (Signed)
Please advise 

## 2018-07-01 ENCOUNTER — Encounter: Payer: Self-pay | Admitting: Family Medicine

## 2018-07-01 MED ORDER — AMPHETAMINE-DEXTROAMPHETAMINE 20 MG PO TABS: 20.0000 mg | ORAL_TABLET | Freq: Two times a day (BID) | ORAL | 0 refills | Status: DC

## 2018-07-01 NOTE — Telephone Encounter (Signed)
Refilled adderall and notified patient by [employer]

## 2018-07-06 ENCOUNTER — Ambulatory Visit (INDEPENDENT_AMBULATORY_CARE_PROVIDER_SITE_OTHER): Payer: BLUE CROSS/BLUE SHIELD | Admitting: Psychology

## 2018-07-06 DIAGNOSIS — F411 Generalized anxiety disorder: Secondary | ICD-10-CM

## 2018-07-09 ENCOUNTER — Ambulatory Visit (INDEPENDENT_AMBULATORY_CARE_PROVIDER_SITE_OTHER): Payer: BLUE CROSS/BLUE SHIELD | Admitting: Physician Assistant

## 2018-07-09 ENCOUNTER — Ambulatory Visit (INDEPENDENT_AMBULATORY_CARE_PROVIDER_SITE_OTHER): Payer: BLUE CROSS/BLUE SHIELD

## 2018-07-09 ENCOUNTER — Encounter: Payer: Self-pay | Admitting: Physician Assistant

## 2018-07-09 ENCOUNTER — Other Ambulatory Visit: Payer: Self-pay

## 2018-07-09 VITALS — BP 112/55 | HR 74 | Temp 98.3°F | Resp 18 | Ht 67.5 in | Wt 149.4 lb

## 2018-07-09 DIAGNOSIS — Z23 Encounter for immunization: Secondary | ICD-10-CM | POA: Diagnosis not present

## 2018-07-09 DIAGNOSIS — S3992XA Unspecified injury of lower back, initial encounter: Secondary | ICD-10-CM | POA: Diagnosis not present

## 2018-07-09 DIAGNOSIS — M533 Sacrococcygeal disorders, not elsewhere classified: Secondary | ICD-10-CM | POA: Diagnosis not present

## 2018-07-09 IMAGING — DX DG SACRUM/COCCYX 2+V
3 series · 3 of 3 positions shown · non-contrast
Comparison: None.

CLINICAL DATA: Tailbone pain for the past day.

EXAM:
SACRUM AND COCCYX - 2+ VIEW

[coccyx ap]
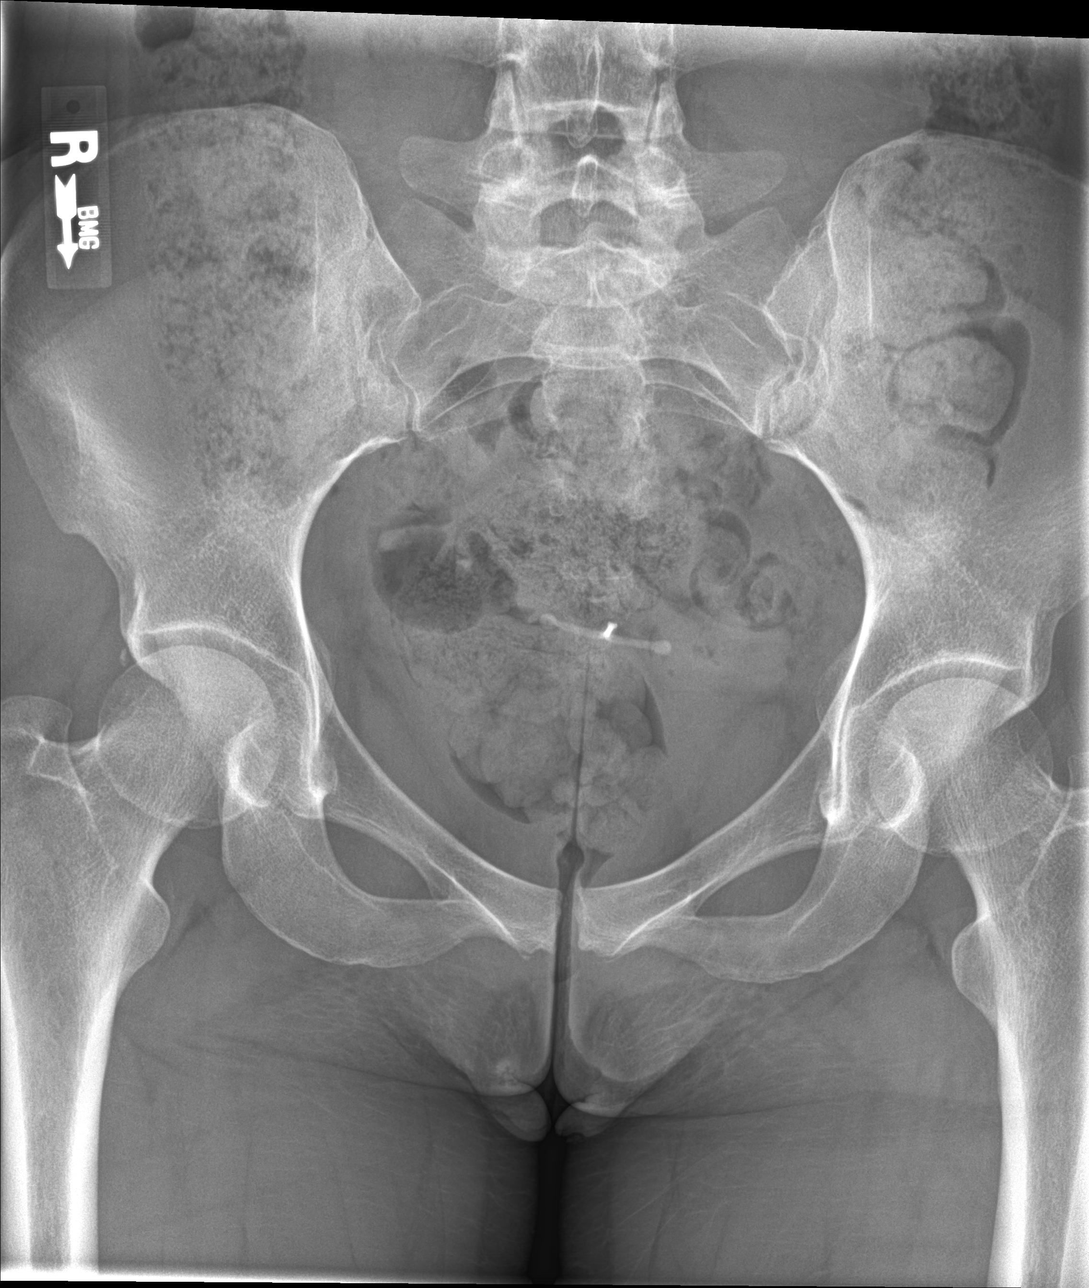

[sacrum ap]
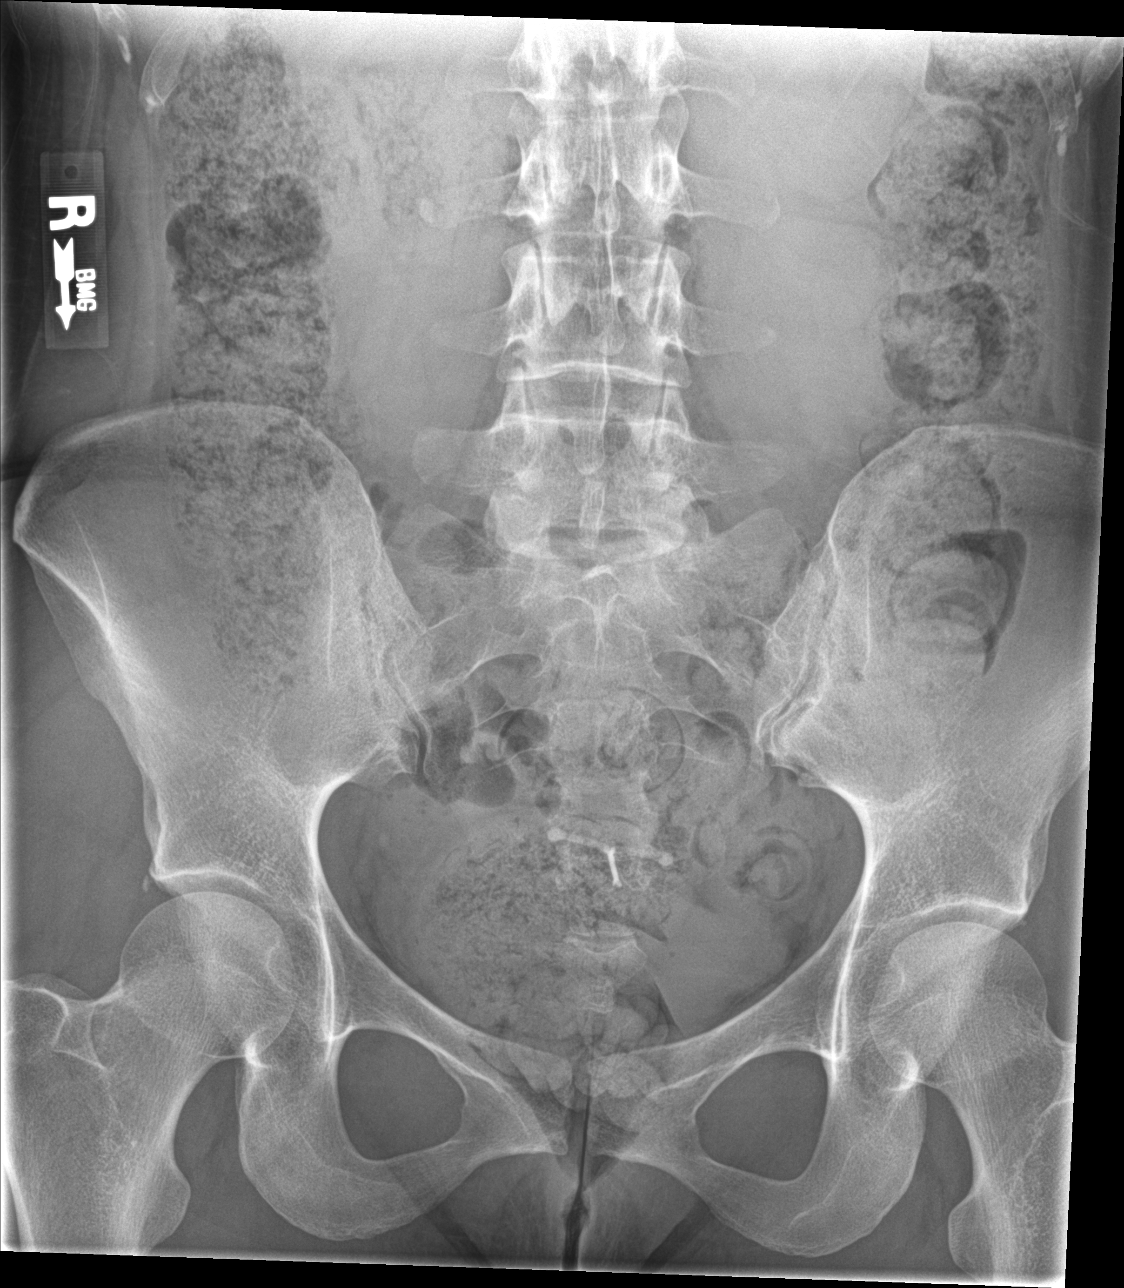

[sacrum lat]
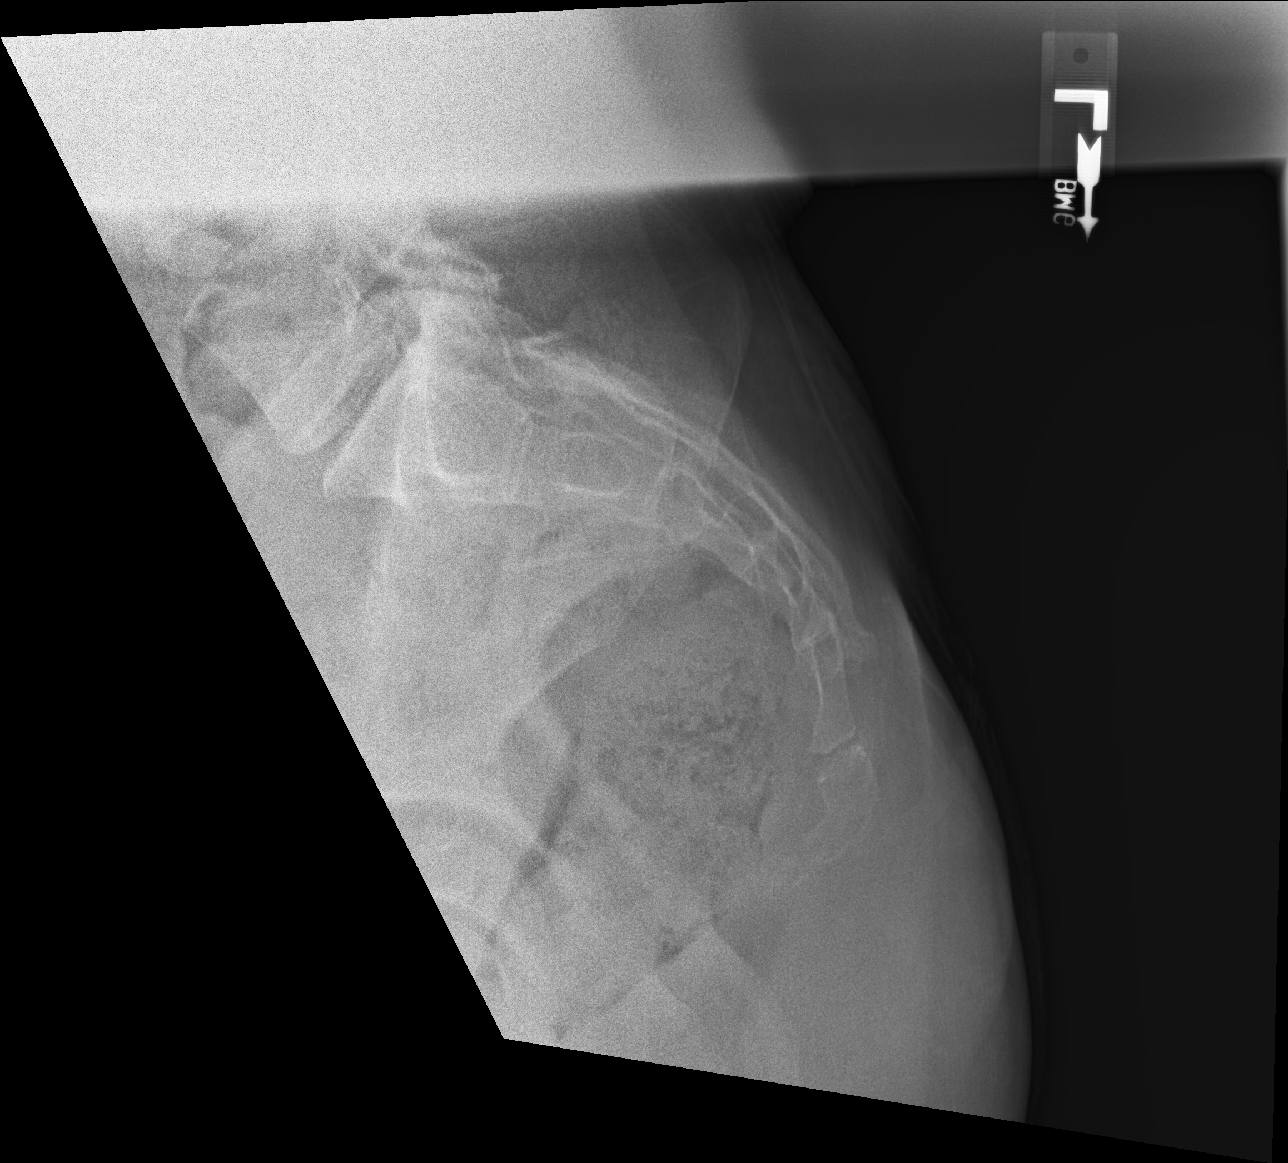

[3 of 3 positions shown; findings below may reference images not displayed]

FINDINGS: There is no evidence of fracture or other focal bone lesions. The
sacroiliac joints and pubic symphysis are unremarkable. IUD in the
pelvis.
IMPRESSION: Negative.

## 2018-07-09 MED ORDER — NAPROXEN 500 MG PO TABS: 500.0000 mg | ORAL_TABLET | Freq: Two times a day (BID) | ORAL | 0 refills | Status: DC

## 2018-07-09 NOTE — Progress Notes (Signed)
TISH BEGIN  MRN: [id number] DOB: December 02, 1990  Subjective:  [name] [name] is a 27 y.o. female seen in office today for a chief complaint of 1 day hx of tailbone pain. Hit it on a baby gate last night. Has had a lot of pain since then. Aggravated by sitting and standing from seated position. Relieved with standing. Has tried ibuprofen with no relief. Has not tried to have a BM yet. Denies numbness, tingling, and weakness of BLE. Former smoker, quit in 2014.   Review of Systems Per HPI  Patient Active Problem List   Diagnosis Date Noted  . Insomnia 12/09/2016  . ADHD (attention deficit hyperactivity disorder) 10/27/2014  . GAD (generalized anxiety disorder) 10/27/2014  . Other acne 05/03/2008  . Allergic rhinitis 01/27/2008    Current Outpatient Medications on File Prior to Visit  Medication Sig Dispense Refill  . albuterol (PROVENTIL HFA;VENTOLIN HFA) 108 (90 Base) MCG/ACT inhaler Inhale 1-2 puffs into the lungs every 6 (six) hours as needed for wheezing or shortness of breath. 1 Inhaler 0  . amphetamine-dextroamphetamine (ADDERALL) 20 MG tablet Take 1 tablet (20 mg total) by mouth 2 (two) times daily. 60 tablet 0  . LORazepam (ATIVAN) 0.5 MG tablet Take 1 tablet (0.5 mg total) by mouth daily as needed for anxiety. 30 tablet 0  . cetirizine (ZYRTEC) 10 MG tablet Take 1 tablet (10 mg total) by mouth daily. (Patient not taking: Reported on 07/09/2018) 30 tablet 11  . fluticasone (FLONASE) 50 MCG/ACT nasal spray Place 1 spray into both nostrils 2 (two) times daily. (Patient not taking: Reported on 07/09/2018) 16 g 6  . HYDROcodone-homatropine (HYCODAN) 5-1.5 MG/5ML syrup Take 5 mLs by mouth at bedtime as needed. (Patient not taking: Reported on 07/09/2018) 100 mL 0  . predniSONE (DELTASONE) 20 MG tablet Take 2 tablets daily with breakfast. (Patient not taking: Reported on 07/09/2018) 10 tablet 0   No current facility-administered medications on file prior to visit.     Allergies    Allergen Reactions  . Ceclor [Cefaclor] Hives    Age 8 mild rash      Social History   Socioeconomic History  . Marital status: Married    Spouse name: Not on file  . Number of children: Not on file  . Years of education: Not on file  . Highest education level: Not on file  Occupational History  . Not on file  Social Needs  . Financial resource strain: Not on file  . Food insecurity:    Worry: Not on file    Inability: Not on file  . Transportation needs:    Medical: Not on file    Non-medical: Not on file  Tobacco Use  . Smoking status: Former Smoker    Types: Cigarettes    Last attempt to quit: 08/15/2013    Years since quitting: 4.9  . Smokeless tobacco: Never Used  Substance and Sexual Activity  . Alcohol use: No    Alcohol/week: 2.0 standard drinks    Types: 2 Standard drinks or equivalent per week    Comment: socially, not with preg  . Drug use: No  . Sexual activity: Not Currently    Birth control/protection: None  Lifestyle  . Physical activity:    Days per week: Not on file    Minutes per session: Not on file  . Stress: Not on file  Relationships  . Social connections:    Talks on phone: Not on file    Gets together:  Not on file    Attends religious service: Not on file    Active member of club or organization: Not on file    Attends meetings of clubs or organizations: Not on file    Relationship status: Not on file  . Intimate partner violence:    Fear of current or ex partner: Not on file    Emotionally abused: Not on file    Physically abused: Not on file    Forced sexual activity: Not on file  Other Topics Concern  . Not on file  Social History Narrative  . Not on file    Objective:  BP (!) 112/55   Pulse 74   Temp 98.3 F (36.8 C) (Oral)   Resp 18   Ht 5' 7.5" (1.715 m)   Wt 149 lb 6.4 oz (67.8 kg)   SpO2 96%   BMI 23.05 kg/m   Physical Exam  Constitutional: She is oriented to person, place, and time. She appears well-developed  and well-nourished.  HENT:  Head: Normocephalic and atraumatic.  Eyes: Conjunctivae are normal.  Neck: Normal range of motion.  Pulmonary/Chest: Effort normal.  Genitourinary: Rectum normal.  Musculoskeletal:       Back:  Neurological: She is alert and oriented to person, place, and time.  Skin: Skin is warm and dry.  Psychiatric: She has a normal mood and affect.  Vitals reviewed.    Dg Sacrum/coccyx  Result Date: 07/09/2018 CLINICAL DATA:  Tailbone pain for the past day. EXAM: SACRUM AND COCCYX - 2+ VIEW COMPARISON:  None. FINDINGS: There is no evidence of fracture or other focal bone lesions. The sacroiliac joints and pubic symphysis are unremarkable. IUD in the pelvis. IMPRESSION: Negative. Electronically Signed   By: Obie Dredge M.D.   On: 07/09/2018 12:51     Assessment and Plan :  1. Injury of coccyx, initial encounter No fx on plain films. Rec conservative tx with donut pillow, ice, and NSAIDs as needed. NVI. Return to clinic if symptoms worsen, do not improve, or as needed.  - DG Sacrum/Coccyx; Future - naproxen (NAPROSYN) 500 MG tablet; Take 1 tablet (500 mg total) by mouth 2 (two) times daily with a meal.  Dispense: 30 tablet; Refill: 0  2. Flu vaccine need - Flu Vaccine QUAD 36+ mos IM  Benjiman Core PA-C  [facility] 07/09/2018 12:54 PM

## 2018-07-09 NOTE — Patient Instructions (Addendum)
If you have lab work done today you will be contacted with your lab results within the next 2 weeks.  If you have not heard from [place] then please contact us. The fastest way to get your results is to register for My Chart.   Tailbone Injury The tailbone (coccyx) is the small bone at the lower end of the spine. A tailbone injury may involve stretched ligaments, bruising, or a broken bone (fracture). Tailbone injuries can be painful, and some may take a long time to heal. What are the causes? This condition is often caused by falling and landing on the tailbone. Other causes include:  Repeated strain or friction from actions such as rowing and bicycling.  Childbirth.  In some cases, the cause may not be known. What increases the risk? This condition is more common in women than in men. What are the signs or symptoms? Symptoms of this condition include:  Pain in the lower back, especially when sitting.  Pain or difficulty when standing up from a sitting position.  Bruising in the tailbone area.  Painful bowel movements.  In women, pain during intercourse.  How is this diagnosed? This condition may be diagnosed based on your symptoms and a physical exam. X-rays may be taken if a fracture is suspected. You may also have other tests, such as a CT scan or MRI. How is this treated? This condition may be treated with medicines to help relieve your pain. Most tailbone injuries heal on their own in 4-6 weeks. However, recovery time may be longer if the injury involves a fracture. Follow these instructions at home:  Take medicines only as directed by your health care provider.  If directed, apply ice to the injured area: ? Put ice in a plastic bag. ? Place a towel between your skin and the bag. ? Leave the ice on for 20 minutes, 2-3 times per day for the first 1-2 days.  Sit on a large, rubber or inflated ring or cushion to ease your pain. Lean forward when you are sitting to help  decrease discomfort.  Avoid sitting for long periods of time.  Increase your activity as the pain allows. Perform any exercises that are recommended by your health care provider or physical therapist.  If you have pain during bowel movements, use stool softeners as directed by your health care provider.  Eat a diet that includes plenty of fiber to help prevent constipation.  Keep all follow-up visits as directed by your health care provider. This is important. How is this prevented? Wear appropriate padding and sports gear when bicycling and rowing. This can help to prevent developing an injury that is caused by repeated strain or friction. Contact a health care provider if:  Your pain becomes worse.  Your bowel movements cause a great deal of discomfort.  You are unable to have a bowel movement.  You have uncontrolled urine loss (urinary incontinence).  You have a fever. This information is not intended to replace advice given to you by your health care provider. Make sure you discuss any questions you have with your health care provider. Document Released: 08/29/2000 Document Revised: 05/01/2016 Document Reviewed: 08/28/2014 Elsevier Interactive Patient Education  2018 [employer].  IF you received an x-ray today, you will receive an invoice from [facility]. Please contact [facility] at [phone] with questions or concerns regarding your invoice.   IF you received labwork today, you will receive an invoice from [employer]. Please contact [facility]  at [phone] with questions or concerns regarding your invoice.   Our billing staff will not be able to assist you with questions regarding bills from these companies.  You will be contacted with the lab results as soon as they are available. The fastest way to get your results is to activate your My Chart account. Instructions are located on the last page of this paperwork. If you have not heard from [place]  regarding the results in 2 weeks, please contact this office.

## 2018-07-23 ENCOUNTER — Other Ambulatory Visit: Payer: Self-pay | Admitting: Physician Assistant

## 2018-07-23 DIAGNOSIS — J9801 Acute bronchospasm: Secondary | ICD-10-CM

## 2018-07-23 NOTE — Telephone Encounter (Signed)
Requested Prescriptions  Pending Prescriptions Disp Refills  . PROAIR HFA 108 (90 Base) MCG/ACT inhaler [Pharmacy Med Name: PROAIR HFA 90 MCG INHALER] 8.5 g 0    Sig: INHALE 1 OR 2 PUFFS INTO THE LUNGS EVERY 6 HOURS AS NEEDED FOR WHEEZEOR SHORTNESS OF BREATH     Pulmonology:  Beta Agonists Failed - 07/23/2018  2:23 PM      Failed - One inhaler should last at least one month. If the patient is requesting refills earlier, contact the patient to check for uncontrolled symptoms.      Passed - Valid encounter within last 12 months    Recent Outpatient Visits          2 weeks ago Injury of coccyx, initial encounter   Primary Care at [facility], [place] D, PA-C   3 months ago Attention deficit hyperactivity disorder (ADHD), combined type   Primary Care at [facility], Manus Rudd, MD   4 months ago Attention deficit hyperactivity disorder (ADHD), combined type   [facility] at [facility], Meda Coffee, MD   5 months ago Pneumonia of right middle lobe due to infectious organism [facility])   Primary [facility] at [facility], [place] D, PA-C   10 months ago Attention deficit hyperactivity disorder (ADHD), combined type   Primary Care at [facility], Myrle Sheng, MD      Future Appointments            In 2 weeks Doristine Bosworth, MD Primary Care at [place], [facility]

## 2018-08-03 ENCOUNTER — Other Ambulatory Visit: Payer: Self-pay | Admitting: Family Medicine

## 2018-08-03 DIAGNOSIS — F902 Attention-deficit hyperactivity disorder, combined type: Secondary | ICD-10-CM

## 2018-08-03 NOTE — Telephone Encounter (Signed)
Requested medication (s) are due for refill today: yes  Requested medication (s) are on the active medication list: yes  Last refill:  07/01/18 #60  Future visit scheduled: yes  Notes to clinic:  Unable to refill per protocol    Requested Prescriptions  Pending Prescriptions Disp Refills   amphetamine-dextroamphetamine (ADDERALL) 20 MG tablet [Pharmacy Med Name: DEXTROAMP-AMPHETAMIN 20 MG TAB] 60 tablet 0    Sig: TAKE 1 TABLET BY MOUTH TWICE DAILY.     Not Delegated - Psychiatry:  Stimulants/ADHD Failed - 08/03/2018  3:43 PM      Failed - This refill cannot be delegated      Failed - Urine Drug Screen completed in last 360 days.      Failed - Valid encounter within last 3 months    Recent Outpatient Visits          3 weeks ago Injury of coccyx, initial encounter   Primary [facility] at [facility], [place] D, PA-C   4 months ago Attention deficit hyperactivity disorder (ADHD), combined type   [facility], Manus RuddZoe A, MD   5 months ago Attention deficit hyperactivity disorder (ADHD), combined type   [facility], Meda CoffeeIrma M, MD   5 months ago Pneumonia of right middle lobe due to infectious organism [facility](HCC)   [facility], [place] D, PA-C   11 months ago Attention deficit hyperactivity disorder (ADHD), combined type   [facility], Myrle ShengKristi M, MD      Future Appointments            In 3 days Doristine BosworthStallings, Zoe A, MD [facility]

## 2018-08-06 ENCOUNTER — Ambulatory Visit (INDEPENDENT_AMBULATORY_CARE_PROVIDER_SITE_OTHER): Payer: BLUE CROSS/BLUE SHIELD | Admitting: Family Medicine

## 2018-08-06 ENCOUNTER — Encounter: Payer: Self-pay | Admitting: Family Medicine

## 2018-08-06 ENCOUNTER — Other Ambulatory Visit: Payer: Self-pay

## 2018-08-06 VITALS — BP 116/74 | HR 77 | Temp 98.0°F | Resp 17 | Ht 67.5 in | Wt 149.2 lb

## 2018-08-06 DIAGNOSIS — R059 Cough, unspecified: Secondary | ICD-10-CM

## 2018-08-06 DIAGNOSIS — F411 Generalized anxiety disorder: Secondary | ICD-10-CM

## 2018-08-06 DIAGNOSIS — R05 Cough: Secondary | ICD-10-CM

## 2018-08-06 DIAGNOSIS — F902 Attention-deficit hyperactivity disorder, combined type: Secondary | ICD-10-CM | POA: Diagnosis not present

## 2018-08-06 MED ORDER — LORAZEPAM 0.5 MG PO TABS: 0.5000 mg | ORAL_TABLET | Freq: Every day | ORAL | 0 refills | Status: DC | PRN

## 2018-08-06 MED ORDER — AMPHETAMINE-DEXTROAMPHETAMINE 20 MG PO TABS: 20.0000 mg | ORAL_TABLET | Freq: Two times a day (BID) | ORAL | 0 refills | Status: DC

## 2018-08-06 NOTE — Progress Notes (Signed)
Chief Complaint  Patient presents with  . Medication Refill    ativan, adderall, fluticasone, albuterol    HPI  ADD Pt has a diagnosis of ADD/ADHD Pt is taking adderall 20mg  bid and has good results Pt does not use other stimulants such as energy drinks or caffeine Pt is able to sleep at night and denies unintentional weight loss Pt  denies mood swings Pt skips doses when not working or on weekends  Anxiety She has comorbid anxiety and takes ativan about 4 times a month for panic attacks She tries other things to control her symptoms like deep breathing  Cough She was on albuterol for a cough after her acute URI She reports that she has been fevers free and her symptoms resolved after prednisone  Past Medical History:  Diagnosis Date  . ADHD (attention deficit hyperactivity disorder)   . Anxiety   . Panic attacks   . Vaginal Pap smear, abnormal     Current Outpatient Medications  Medication Sig Dispense Refill  . albuterol (PROVENTIL HFA;VENTOLIN HFA) 108 (90 Base) MCG/ACT inhaler Inhale 1-2 puffs into the lungs every 6 (six) hours as needed for wheezing or shortness of breath. 1 Inhaler 0  . amphetamine-dextroamphetamine (ADDERALL) 20 MG tablet Take 1 tablet (20 mg total) by mouth 2 (two) times daily. 60 tablet 0  . fluticasone (FLONASE) 50 MCG/ACT nasal spray Place 1 spray into both nostrils 2 (two) times daily. 16 g 6  . LORazepam (ATIVAN) 0.5 MG tablet Take 1 tablet (0.5 mg total) by mouth daily as needed for anxiety. 10 tablet 0  . cetirizine (ZYRTEC) 10 MG tablet Take 1 tablet (10 mg total) by mouth daily. (Patient not taking: Reported on 07/09/2018) 30 tablet 11   No current facility-administered medications for this visit.     Allergies:  Allergies  Allergen Reactions  . Ceclor [Cefaclor] Hives    Age 27 mild rash    Past Surgical History:  Procedure Laterality Date  . DILATION AND CURETTAGE OF UTERUS  01/04/16  . DILATION AND EVACUATION N/A 01/04/2016   Procedure: DILATATION AND EVACUATION;  Surgeon: Candice Campavid Lowe, MD;  Location: [facility];  Service: Gynecology;  Laterality: N/A;  . GUM SURGERY  01/2017  . INDUCED ABORTION    . wisdom teeth removal Bilateral    2010    Social History   Socioeconomic History  . Marital status: Married    Spouse name: Not on file  . Number of children: Not on file  . Years of education: Not on file  . Highest education level: Not on file  Occupational History  . Not on file  Social Needs  . Financial resource strain: Not on file  . Food insecurity:    Worry: Not on file    Inability: Not on file  . Transportation needs:    Medical: Not on file    Non-medical: Not on file  Tobacco Use  . Smoking status: Former Smoker    Types: Cigarettes    Last attempt to quit: 08/15/2013    Years since quitting: 4.9  . Smokeless tobacco: Never Used  Substance and Sexual Activity  . Alcohol use: No    Alcohol/week: 2.0 standard drinks    Types: 2 Standard drinks or equivalent per week    Comment: socially, not with preg  . Drug use: No  . Sexual activity: Not Currently    Birth control/protection: None  Lifestyle  . Physical activity:    Days per week: Not on  file    Minutes per session: Not on file  . Stress: Not on file  Relationships  . Social connections:    Talks on phone: Not on file    Gets together: Not on file    Attends religious service: Not on file    Active member of club or organization: Not on file    Attends meetings of clubs or organizations: Not on file    Relationship status: Not on file  Other Topics Concern  . Not on file  Social History Narrative  . Not on file    Family History  Problem Relation Age of Onset  . Hypertension Mother   . Heart disease Father   . Cancer Father        testicular  . Hypertension Father   . Hyperlipidemia Father   . Heart disease Paternal Grandfather      ROS Review of Systems See HPI Constitution: No fevers or chills No malaise No  diaphoresis Skin: No rash or itching Eyes: no blurry vision, no double vision GU: no dysuria or hematuria Neuro: no dizziness or headaches all others reviewed and negative   Objective: Vitals:   08/06/18 1127  BP: 116/74  Pulse: 77  Resp: 17  Temp: 98 F (36.7 C)  TempSrc: Oral  SpO2: 98%  Weight: 149 lb 3.2 oz (67.7 kg)  Height: 5' 7.5" (1.715 m)    Physical Exam Physical Exam  Constitutional: He is oriented to person, place, and time. He appears well-developed and well-nourished.  HENT:  Head: Normocephalic and atraumatic.  Eyes: Conjunctivae and EOM are normal.  Cardiovascular: Normal rate, regular rhythm, normal heart sounds and intact distal pulses.  No murmur heard. Pulmonary/Chest: Effort normal and breath sounds normal. No stridor. No respiratory distress. He has no wheezes.  Neurological: He is alert and oriented to person, place, and time.  Skin: Skin is warm. Capillary refill takes less than 2 seconds.  Psychiatric: He has a normal mood and affect. His behavior is normal. Judgment and thought content normal.   Assessment and Plan Pamelyn was seen today for medication refill.  Diagnoses and all orders for this visit:  Attention deficit hyperactivity disorder (ADHD), combined type-  Patient stable  PMP Aware [place] reviewed and deemed appropriate -     amphetamine-dextroamphetamine (ADDERALL) 20 MG tablet; Take 1 tablet (20 mg total) by mouth 2 (two) times daily.  Cough-  improved  GAD (generalized anxiety disorder)-  Discussed anxiety and will limit lorazepam  Other orders -     LORazepam (ATIVAN) 0.5 MG tablet; Take 1 tablet (0.5 mg total) by mouth daily as needed for anxiety.     Hugh Kamara A Novice Vrba

## 2018-08-06 NOTE — Patient Instructions (Signed)
° ° ° °  If you have lab work done today you will be contacted with your lab results within the next 2 weeks.  If you have not heard from us then please contact us. The fastest way to get your results is to register for My Chart. ° ° °IF you received an x-ray today, you will receive an invoice from [facility]. Please contact [facility] at [phone] with questions or concerns regarding your invoice.  ° °IF you received labwork today, you will receive an invoice from [facility]. Please contact [facility] at [phone] with questions or concerns regarding your invoice.  ° °Our billing staff will not be able to assist you with questions regarding bills from these companies. ° °You will be contacted with the lab results as soon as they are available. The fastest way to get your results is to activate your My Chart account. Instructions are located on the last page of this paperwork. If you have not heard from us regarding the results in 2 weeks, please contact this office. °  ° ° ° °

## 2018-09-02 ENCOUNTER — Other Ambulatory Visit: Payer: Self-pay | Admitting: Family Medicine

## 2018-09-02 DIAGNOSIS — F902 Attention-deficit hyperactivity disorder, combined type: Secondary | ICD-10-CM

## 2018-09-02 NOTE — Telephone Encounter (Signed)
Please advise 

## 2018-09-03 NOTE — Telephone Encounter (Signed)
Patient is requesting a refill of the following medications: Requested Prescriptions   Pending Prescriptions Disp Refills  . amphetamine-dextroamphetamine (ADDERALL) 20 MG tablet [Pharmacy Med Name: DEXTROAMP-AMPHETAMIN 20 MG TAB] 60 tablet 0    Sig: TAKE 1 TABLET BY MOUTH TWICE DAILY.    Date of patient request: 09/02/2018 Last office visit: 08/06/2018 Date of last refill: 08/06/2018 Last refill amount: 60 Follow up time period per chart:

## 2018-09-30 ENCOUNTER — Other Ambulatory Visit: Payer: Self-pay | Admitting: Family Medicine

## 2018-09-30 DIAGNOSIS — F902 Attention-deficit hyperactivity disorder, combined type: Secondary | ICD-10-CM

## 2018-09-30 NOTE — Telephone Encounter (Signed)
Requested medication (s) are due for refill today: yes  Requested medication (s) are on the active medication list: yes  Last refill:  adderall 09/03/18  #60 [name] [name],  Ativan 08/06/18 #10 0refills  Future visit scheduled: no  Notes to clinic:  Medications not delegated    Requested Prescriptions  Pending Prescriptions Disp Refills   amphetamine-dextroamphetamine (ADDERALL) 20 MG tablet [Pharmacy Med Name: DEXTROAMP-AMPHETAMIN 20 MG TAB] 60 tablet 0    Sig: TAKE 1 TABLET BY MOUTH TWICE DAILY.     Not Delegated - Psychiatry:  Stimulants/ADHD Failed - 09/30/2018 12:09 PM      Failed - This refill cannot be delegated      Failed - Urine Drug Screen completed in last 360 days.      Passed - Valid encounter within last 3 months    Recent Outpatient Visits          1 month ago Attention deficit hyperactivity disorder (ADHD), combined type   Primary Care at [facility], Manus Rudd, MD   2 months ago Injury of coccyx, initial encounter   Primary Care at [facility], [place] D, PA-C   6 months ago Attention deficit hyperactivity disorder (ADHD), combined type   Primary Care at [facility], Manus Rudd, MD   7 months ago Attention deficit hyperactivity disorder (ADHD), combined type   [facility] at [facility], Meda Coffee, MD   7 months ago Pneumonia of right middle lobe due to infectious organism [facility])   Primary Care at [facility], [place] D, PA-C            LORazepam (ATIVAN) 0.5 MG tablet [Pharmacy Med Name: LORAZEPAM 0.5 MG TABLET] 10 tablet 0    Sig: TAKE 1 TABLET DAILY AS NEEDED FOR ANXIETY.     Not Delegated - Psychiatry:  Anxiolytics/Hypnotics Failed - 09/30/2018 12:09 PM      Failed - This refill cannot be delegated      Failed - Urine Drug Screen completed in last 360 days.      Passed - Valid encounter within last 6 months    Recent Outpatient Visits          1 month ago Attention deficit hyperactivity disorder (ADHD), combined type   Primary Care at  [facility], Manus Rudd, MD   2 months ago Injury of coccyx, initial encounter   Primary Care at [facility], [place] D, PA-C   6 months ago Attention deficit hyperactivity disorder (ADHD), combined type   Primary Care at [facility], Manus Rudd, MD   7 months ago Attention deficit hyperactivity disorder (ADHD), combined type   [facility] at [facility], Meda Coffee, MD   7 months ago Pneumonia of right middle lobe due to infectious organism [facility])   Primary Care at [facility], [place] D, PA-C

## 2018-10-01 NOTE — Telephone Encounter (Signed)
Please Advise  Patient is requesting a refill of the following medications: Requested Prescriptions   Pending Prescriptions Disp Refills  . amphetamine-dextroamphetamine (ADDERALL) 20 MG tablet [Pharmacy Med Name: DEXTROAMP-AMPHETAMIN 20 MG TAB] 60 tablet 0    Sig: TAKE 1 TABLET BY MOUTH TWICE DAILY.  Marland Kitchen LORazepam (ATIVAN) 0.5 MG tablet [Pharmacy Med Name: LORAZEPAM 0.5 MG TABLET] 10 tablet 0    Sig: TAKE 1 TABLET DAILY AS NEEDED FOR ANXIETY.

## 2018-10-27 ENCOUNTER — Other Ambulatory Visit: Payer: Self-pay | Admitting: Family Medicine

## 2018-10-27 DIAGNOSIS — F902 Attention-deficit hyperactivity disorder, combined type: Secondary | ICD-10-CM

## 2018-10-27 NOTE — Telephone Encounter (Signed)
Patient is requesting a refill of the following medications: Requested Prescriptions   Pending Prescriptions Disp Refills  . amphetamine-dextroamphetamine (ADDERALL) 20 MG tablet [Pharmacy Med Name: DEXTROAMP-AMPHETAMIN 20 MG TAB] 60 tablet 0    Sig: TAKE 1 TABLET BY MOUTH TWICE DAILY.  Marland Kitchen LORazepam (ATIVAN) 0.5 MG tablet [Pharmacy Med Name: LORAZEPAM 0.5 MG TABLET] 10 tablet 0    Sig: TAKE 1 TABLET DAILY AS NEEDED FOR ANXIETY.    Date of patient request: 10/02/2018 Last office visit:08/06/18 Date of last refill: 10/01/18 Last refill amount: adderall #60, lorazepam #10  Follow up time period per chart:   Return in about 3 months (around 11/06/2018) for ADD follow up .  Please advise. Dgaddy, CMA

## 2018-10-27 NOTE — Telephone Encounter (Signed)
Requested medication (s) are due for refill today: amphetamine-dextroamphetamine yes  Requested medication (s) are on the active medication list: yes  Last refill:  10/02/2018  Future visit scheduled: no  Notes to clinic:  Not delegated  Requested medication (s) are due for refill today: lorazepam yes  Requested medication (s) are on the active medication list: yes  Last refill:  10/02/2018  Future visit scheduled: no  Notes to clinic:  Not delegated     Requested Prescriptions  Pending Prescriptions Disp Refills   amphetamine-dextroamphetamine (ADDERALL) 20 MG tablet [Pharmacy Med Name: DEXTROAMP-AMPHETAMIN 20 MG TAB] 60 tablet 0    Sig: TAKE 1 TABLET BY MOUTH TWICE DAILY.     Not Delegated - Psychiatry:  Stimulants/ADHD Failed - 10/27/2018  1:29 PM      Failed - This refill cannot be delegated      Failed - Urine Drug Screen completed in last 360 days.      Passed - Valid encounter within last 3 months    Recent Outpatient Visits          2 months ago Attention deficit hyperactivity disorder (ADHD), combined type   Primary Care at [facility], Manus Rudd, MD   3 months ago Injury of coccyx, initial encounter   Primary Care at [facility], [place] D, PA-C   7 months ago Attention deficit hyperactivity disorder (ADHD), combined type   Primary Care at [facility], Manus Rudd, MD   7 months ago Attention deficit hyperactivity disorder (ADHD), combined type   [facility] at [facility], Meda Coffee, MD   8 months ago Pneumonia of right middle lobe due to infectious organism [facility])   Primary Care at [facility], [place] D, PA-C            LORazepam (ATIVAN) 0.5 MG tablet [Pharmacy Med Name: LORAZEPAM 0.5 MG TABLET] 10 tablet 0    Sig: TAKE 1 TABLET DAILY AS NEEDED FOR ANXIETY.     Not Delegated - Psychiatry:  Anxiolytics/Hypnotics Failed - 10/27/2018  1:29 PM      Failed - This refill cannot be delegated      Failed - Urine Drug Screen completed in last 360  days.      Passed - Valid encounter within last 6 months    Recent Outpatient Visits          2 months ago Attention deficit hyperactivity disorder (ADHD), combined type   Primary Care at [facility], Manus Rudd, MD   3 months ago Injury of coccyx, initial encounter   Primary Care at [facility], [place] D, PA-C   7 months ago Attention deficit hyperactivity disorder (ADHD), combined type   Primary Care at [facility], Manus Rudd, MD   7 months ago Attention deficit hyperactivity disorder (ADHD), combined type   [facility] at [facility], Meda Coffee, MD   8 months ago Pneumonia of right middle lobe due to infectious organism [facility])   Primary Care at [facility], [place] D, PA-C

## 2018-11-01 ENCOUNTER — Ambulatory Visit: Payer: Self-pay | Admitting: Family Medicine

## 2018-11-03 ENCOUNTER — Ambulatory Visit: Payer: Self-pay | Admitting: Family Medicine

## 2018-11-03 DIAGNOSIS — Z3200 Encounter for pregnancy test, result unknown: Secondary | ICD-10-CM | POA: Diagnosis not present

## 2018-11-03 DIAGNOSIS — Z79899 Other long term (current) drug therapy: Secondary | ICD-10-CM | POA: Diagnosis not present

## 2018-11-03 DIAGNOSIS — F411 Generalized anxiety disorder: Secondary | ICD-10-CM | POA: Diagnosis not present

## 2018-11-03 DIAGNOSIS — F410 Panic disorder [episodic paroxysmal anxiety] without agoraphobia: Secondary | ICD-10-CM | POA: Diagnosis not present

## 2018-11-03 DIAGNOSIS — F909 Attention-deficit hyperactivity disorder, unspecified type: Secondary | ICD-10-CM | POA: Diagnosis not present

## 2018-11-26 ENCOUNTER — Other Ambulatory Visit: Payer: Self-pay | Admitting: Family Medicine

## 2018-11-26 DIAGNOSIS — F902 Attention-deficit hyperactivity disorder, combined type: Secondary | ICD-10-CM

## 2018-11-26 NOTE — Telephone Encounter (Signed)
Requested medication (s) are due for refill today:yes  Requested medication (s) are on the active medication list: yes  Last refill: 11/02/2018  Future visit scheduled: yes  Notes to clinic:  Not delegated    Requested Prescriptions  Pending Prescriptions Disp Refills   amphetamine-dextroamphetamine (ADDERALL) 20 MG tablet [Pharmacy Med Name: DEXTROAMP-AMPHETAMIN 20 MG TAB] 60 tablet 0    Sig: TAKE 1 TABLET BY MOUTH TWICE DAILY.     Not Delegated - Psychiatry:  Stimulants/ADHD Failed - 11/26/2018  9:47 AM      Failed - This refill cannot be delegated      Failed - Urine Drug Screen completed in last 360 days.      Failed - Valid encounter within last 3 months    Recent Outpatient Visits          3 months ago Attention deficit hyperactivity disorder (ADHD), combined type   Primary Care at [facility], Manus Rudd, MD   4 months ago Injury of coccyx, initial encounter   Primary Care at [facility], [place] D, PA-C   8 months ago Attention deficit hyperactivity disorder (ADHD), combined type   Primary Care at [facility], Manus Rudd, MD   8 months ago Attention deficit hyperactivity disorder (ADHD), combined type   [facility] at [facility], Meda Coffee, MD   9 months ago Pneumonia of right middle lobe due to infectious organism [facility])   Primary Care at [facility], Gerald Stabs, PA-C      Future Appointments            In 1 month Doristine Bosworth, MD Primary Care at [place], [facility]

## 2018-11-27 ENCOUNTER — Ambulatory Visit (INDEPENDENT_AMBULATORY_CARE_PROVIDER_SITE_OTHER): Payer: BLUE CROSS/BLUE SHIELD

## 2018-11-27 ENCOUNTER — Encounter (HOSPITAL_COMMUNITY): Payer: Self-pay

## 2018-11-27 ENCOUNTER — Ambulatory Visit (HOSPITAL_COMMUNITY)
Admission: EM | Admit: 2018-11-27 | Discharge: 2018-11-27 | Disposition: A | Discharge status: Home / Self Care | Payer: BLUE CROSS/BLUE SHIELD | Attending: Family Medicine | Admitting: Family Medicine

## 2018-11-27 ENCOUNTER — Other Ambulatory Visit: Payer: Self-pay

## 2018-11-27 DIAGNOSIS — J181 Lobar pneumonia, unspecified organism: Secondary | ICD-10-CM

## 2018-11-27 DIAGNOSIS — R0602 Shortness of breath: Secondary | ICD-10-CM | POA: Diagnosis not present

## 2018-11-27 DIAGNOSIS — J9801 Acute bronchospasm: Secondary | ICD-10-CM

## 2018-11-27 DIAGNOSIS — J189 Pneumonia, unspecified organism: Secondary | ICD-10-CM

## 2018-11-27 IMAGING — DX CHEST - 2 VIEW
2 series · 2 of 2 positions shown · non-contrast
Comparison: [DATE]

CLINICAL DATA: Shortness of breath.  Patient sick for over a week.

EXAM:
CHEST - 2 VIEW

[chest pa]
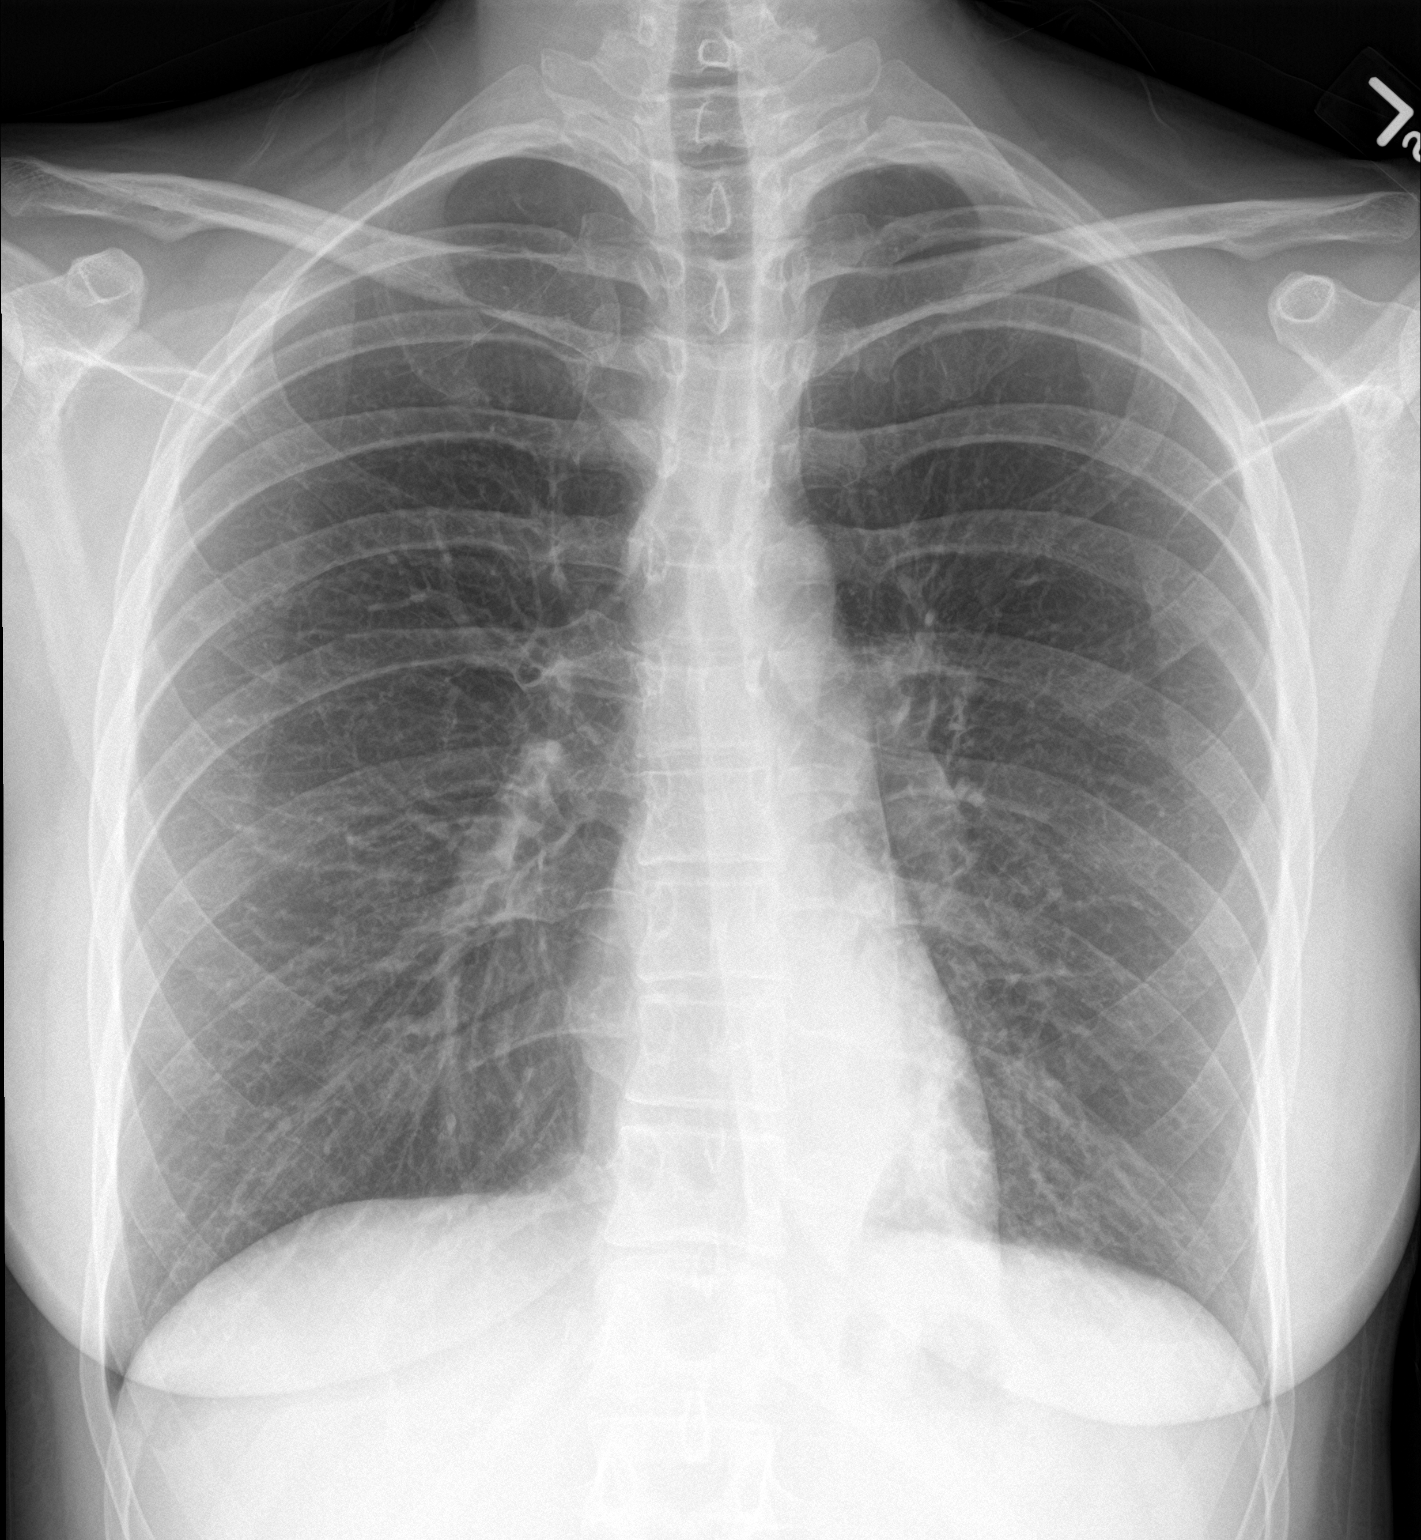

[chest lat]
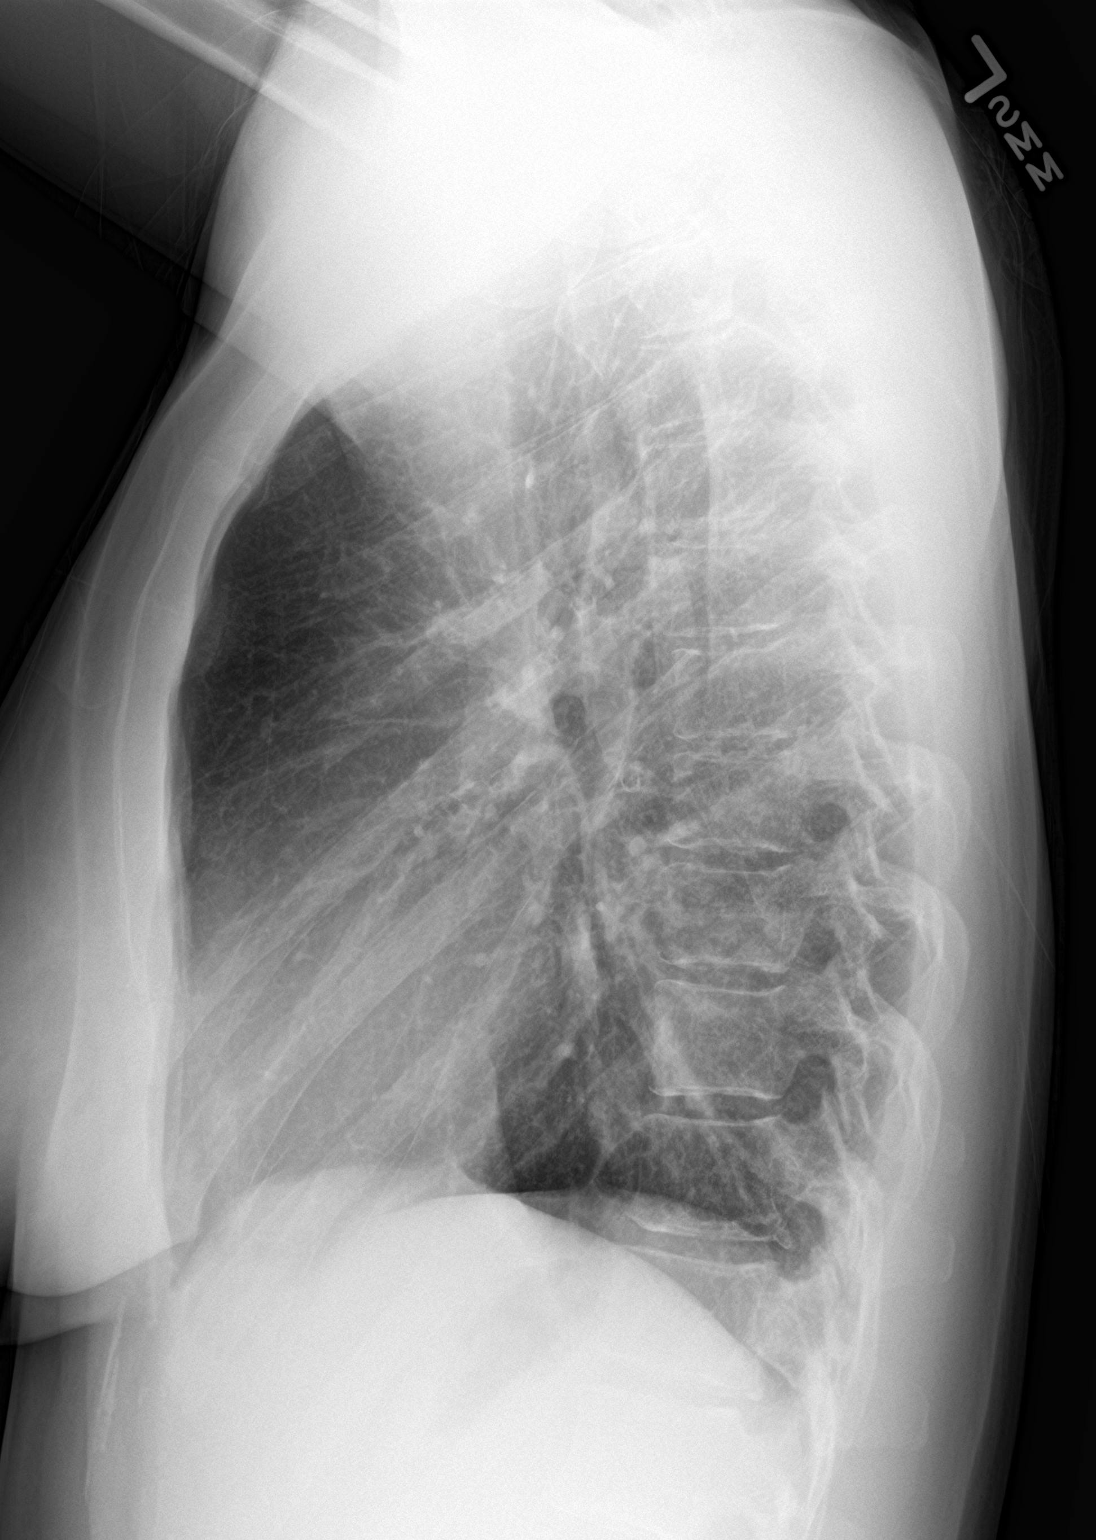

[2 of 2 positions shown; findings below may reference images not displayed]

FINDINGS: The heart size and mediastinal contours are within normal limits.
Both lungs are clear. The visualized skeletal structures are
unremarkable.
IMPRESSION: No active cardiopulmonary disease.

## 2018-11-27 MED ORDER — ALBUTEROL SULFATE HFA 108 (90 BASE) MCG/ACT IN AERS: 1.0000 | INHALATION_SPRAY | Freq: Four times a day (QID) | RESPIRATORY_TRACT | 11 refills | Status: DC | PRN

## 2018-11-27 MED ORDER — BENZONATATE 100 MG PO CAPS: 100.0000 mg | ORAL_CAPSULE | Freq: Three times a day (TID) | ORAL | 0 refills | Status: DC | PRN

## 2018-11-27 MED ORDER — AMOXICILLIN-POT CLAVULANATE 875-125 MG PO TABS: 1.0000 | ORAL_TABLET | Freq: Two times a day (BID) | ORAL | 0 refills | Status: DC

## 2018-11-27 NOTE — ED Triage Notes (Signed)
Pt cc cough and chest congestion, coughing up thick green mucus, and a fever. This has been going on for a week. Pt states it's getting worst.

## 2018-11-27 NOTE — Discharge Instructions (Addendum)
You need to return in 48 hours if the symptoms are not resolving.

## 2018-11-27 NOTE — ED Provider Notes (Signed)
[facility]    CSN: [id number] Arrival date & time: 11/27/18  1002     History   Chief Complaint Chief Complaint  Patient presents with  . Cough    HPI [name] [name] is a 28 y.o. female.   Pt cc cough and chest congestion, coughing up thick green mucus, and a fever. This has been going on for a week. Pt states it's getting worst.  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  She does not smoke.  She has had to use an inhaler in the past and has had a history of pneumonia.  Patient complains of left lower chest pain.  She did take a course of amoxicillin which did not help.  Her daughter's been sick with strep and preceding cough.     Past Medical History:  Diagnosis Date  . ADHD (attention deficit hyperactivity disorder)   . Anxiety   . Panic attacks   . Vaginal Pap smear, abnormal     Patient Active Problem List   Diagnosis Date Noted  . Insomnia 12/09/2016  . ADHD (attention deficit hyperactivity disorder) 10/27/2014  . GAD (generalized anxiety disorder) 10/27/2014  . Other acne 05/03/2008  . Allergic rhinitis 01/27/2008    Past Surgical History:  Procedure Laterality Date  . DILATION AND CURETTAGE OF UTERUS  01/04/16  . DILATION AND EVACUATION N/A 01/04/2016   Procedure: DILATATION AND EVACUATION;  Surgeon: Candice Camp, MD;  Location: [facility];  Service: Gynecology;  Laterality: N/A;  . GUM SURGERY  01/2017  . INDUCED ABORTION    . wisdom teeth removal Bilateral    2010    OB History    Gravida  3   Para  2   Term  2   Preterm      AB  1   Living  2     SAB      TAB  1   Ectopic      Multiple  0   Live Births  2            Home Medications    Prior to Admission medications   Medication Sig Start Date End Date Taking? Authorizing Provider  albuterol (PROVENTIL HFA;VENTOLIN HFA) 108 (90 Base) MCG/ACT inhaler Inhale 1-2 puffs into the lungs every 6 (six) hours as needed for wheezing or shortness of breath. 05/23/18   Wallis Bamberg,  PA-C  amoxicillin-clavulanate (AUGMENTIN) 875-125 MG tablet Take 1 tablet by mouth every 12 (twelve) hours. 11/27/18   Elvina Sidle, MD  amphetamine-dextroamphetamine (ADDERALL) 20 MG tablet TAKE 1 TABLET BY MOUTH TWICE DAILY. 11/02/18   Doristine Bosworth, MD  benzonatate (TESSALON) 100 MG capsule Take 1-2 capsules (100-200 mg total) by mouth 3 (three) times daily as needed for cough. 11/27/18   Elvina Sidle, MD  fluticasone (FLONASE) 50 MCG/ACT nasal spray Place 1 spray into both nostrils 2 (two) times daily. 03/04/18   Myles Lipps, MD  LORazepam (ATIVAN) 0.5 MG tablet TAKE 1 TABLET DAILY AS NEEDED FOR ANXIETY. 11/02/18   Doristine Bosworth, MD    Family History Family History  Problem Relation Age of Onset  . Hypertension Mother   . Heart disease Father   . Cancer Father        testicular  . Hypertension Father   . Hyperlipidemia Father   . Heart disease Paternal Grandfather     Social History Social History   Tobacco Use  . Smoking status: Former Smoker    Types: Cigarettes  Last attempt to quit: 08/15/2013    Years since quitting: 5.2  . Smokeless tobacco: Never Used  Substance Use Topics  . Alcohol use: No    Alcohol/week: 2.0 standard drinks    Types: 2 Standard drinks or equivalent per week    Comment: socially, not with preg  . Drug use: No     Allergies   Ceclor [cefaclor]   Review of Systems Review of Systems   Physical Exam Triage Vital Signs ED Triage Vitals  Enc Vitals Group     BP 11/27/18 1011 113/67     Pulse Rate 11/27/18 1011 61     Resp 11/27/18 1011 18     Temp 11/27/18 1011 98.4 F (36.9 C)     Temp Source 11/27/18 1011 Tympanic     SpO2 11/27/18 1011 100 %     Weight 11/27/18 1013 145 lb (65.8 kg)     Height --      Head Circumference --      Peak Flow --      Pain Score 11/27/18 1012 2     Pain Loc --      Pain Edu? --      Excl. in GC? --    No data found.  Updated Vital Signs BP 113/67 (BP Location: Right Arm)    Pulse 61   Temp 98.4 F (36.9 C) (Tympanic)   Resp 18   Wt 65.8 kg   SpO2 100%   BMI 22.38 kg/m    Physical Exam Vitals signs and nursing note reviewed.  Constitutional:      Appearance: Normal appearance. She is normal weight.  HENT:     Head: Normocephalic.     Right Ear: Tympanic membrane normal.     Left Ear: Tympanic membrane normal.     Nose: Nose normal.     Mouth/Throat:     Mouth: Mucous membranes are moist.  Eyes:     Conjunctiva/sclera: Conjunctivae normal.     Pupils: Pupils are equal, round, and reactive to light.  Neck:     Musculoskeletal: Normal range of motion and neck supple.  Cardiovascular:     Rate and Rhythm: Normal rate and regular rhythm.     Heart sounds: Normal heart sounds.  Pulmonary:     Effort: Pulmonary effort is normal.     Breath sounds: Wheezing, rhonchi and rales present.  Musculoskeletal: Normal range of motion.  Skin:    General: Skin is warm and dry.  Neurological:     General: No focal deficit present.     Mental Status: She is alert and oriented to person, place, and time.  Psychiatric:        Mood and Affect: Mood normal.        Behavior: Behavior normal.      UC Treatments / Results  Labs (all labs ordered are listed, but only abnormal results are displayed) Labs Reviewed - No data to display  EKG None  Radiology Chest x-ray shows streaky left lower lobe density Procedures Procedures (including critical care time)  Medications Ordered in UC Medications - No data to display  Initial Impression / Assessment and Plan / UC Course  I have reviewed the triage vital signs and the nursing notes.  Pertinent labs & imaging results that were available during my care of the patient were reviewed by me and considered in my medical decision making (see chart for details).    Final Clinical Impressions(s) / UC Diagnoses  Final diagnoses:  Community acquired pneumonia of left lower lobe of lung [facility])     Discharge  Instructions     You need to return in 48 hours if the symptoms are not resolving.    ED Prescriptions    Medication Sig Dispense Auth. Provider   amoxicillin-clavulanate (AUGMENTIN) 875-125 MG tablet Take 1 tablet by mouth every 12 (twelve) hours. 20 tablet Elvina Sidle, MD   benzonatate (TESSALON) 100 MG capsule Take 1-2 capsules (100-200 mg total) by mouth 3 (three) times daily as needed for cough. 40 capsule Elvina Sidle, MD     Controlled Substance Prescriptions [place] Controlled Substance Registry consulted? Not Applicable   Elvina Sidle, MD 11/27/18 1109

## 2018-11-29 NOTE — Telephone Encounter (Signed)
Patient is requesting a refill of the following medications: Requested Prescriptions   Pending Prescriptions Disp Refills  . amphetamine-dextroamphetamine (ADDERALL) 20 MG tablet [Pharmacy Med Name: DEXTROAMP-AMPHETAMIN 20 MG TAB] 60 tablet 0    Sig: TAKE 1 TABLET BY MOUTH TWICE DAILY.    Date of patient request: 11/25/2018 Last office visit: 08/06/2018 Date of last refill: 11/02/2018 Last refill amount: 60 Follow up time period per chart:

## 2019-01-05 ENCOUNTER — Other Ambulatory Visit: Payer: Self-pay | Admitting: Family Medicine

## 2019-01-05 DIAGNOSIS — F902 Attention-deficit hyperactivity disorder, combined type: Secondary | ICD-10-CM

## 2019-01-05 NOTE — Telephone Encounter (Signed)
Requested medication (s) are due for refill today: yes  Requested medication (s) are on the active medication list: yes  Last refill: 12/01/2018  #60  0 refills  Future visit scheduled Yes   01/14/2019   Dr. Creta Levin  Notes to clinic:not delegated  Requested Prescriptions  Pending Prescriptions Disp Refills   amphetamine-dextroamphetamine (ADDERALL) 20 MG tablet [Pharmacy Med Name: DEXTROAMP-AMPHETAMIN 20 MG TAB] 60 tablet 0    Sig: TAKE 1 TABLET BY MOUTH TWICE DAILY.     Not Delegated - Psychiatry:  Stimulants/ADHD Failed - 01/05/2019  8:12 AM      Failed - This refill cannot be delegated      Failed - Urine Drug Screen completed in last 360 days.      Failed - Valid encounter within last 3 months    Recent Outpatient Visits          5 months ago Attention deficit hyperactivity disorder (ADHD), combined type   Primary Care at [facility], Manus Rudd, MD   6 months ago Injury of coccyx, initial encounter   Primary Care at [facility], [place] D, PA-C   9 months ago Attention deficit hyperactivity disorder (ADHD), combined type   Primary Care at [facility], Manus Rudd, MD   10 months ago Attention deficit hyperactivity disorder (ADHD), combined type   [facility] at [facility], Meda Coffee, MD   10 months ago Pneumonia of right middle lobe due to infectious organism [facility])   Primary Care at [facility], Gerald Stabs, PA-C      Future Appointments            In 1 week Doristine Bosworth, MD Primary Care at [place], [facility]

## 2019-01-07 ENCOUNTER — Telehealth: Payer: Self-pay | Admitting: Family Medicine

## 2019-01-07 NOTE — Telephone Encounter (Signed)
Copied from CRM [id number]. Topic: General - Other >> Jan 07, 2019  9:58 AM Percival Spanish wrote:  Pt req fill    amphetamine-dextroamphetamine (ADDERALL) 20 MG tablet   fluticasone (FLONASE) 50 MCG/ACT nasal spray   [facility]

## 2019-01-10 ENCOUNTER — Other Ambulatory Visit: Payer: Self-pay | Admitting: *Deleted

## 2019-01-10 ENCOUNTER — Telehealth: Payer: Self-pay | Admitting: Family Medicine

## 2019-01-10 MED ORDER — FLUTICASONE PROPIONATE 50 MCG/ACT NA SUSP: 1.0000 | Freq: Two times a day (BID) | NASAL | 1 refills | Status: DC

## 2019-01-10 NOTE — Telephone Encounter (Signed)
Patient stated she will be out of medication before her appnt if she could get a courtesy until her appnt on 01/14/19

## 2019-01-14 ENCOUNTER — Other Ambulatory Visit: Payer: Self-pay

## 2019-01-14 ENCOUNTER — Encounter: Payer: Self-pay | Admitting: Family Medicine

## 2019-01-14 ENCOUNTER — Telehealth (INDEPENDENT_AMBULATORY_CARE_PROVIDER_SITE_OTHER): Payer: BLUE CROSS/BLUE SHIELD | Admitting: Family Medicine

## 2019-01-14 DIAGNOSIS — F902 Attention-deficit hyperactivity disorder, combined type: Secondary | ICD-10-CM | POA: Diagnosis not present

## 2019-01-14 MED ORDER — FLUTICASONE PROPIONATE 50 MCG/ACT NA SUSP: 1.0000 | Freq: Two times a day (BID) | NASAL | 4 refills | Status: DC

## 2019-01-14 NOTE — Progress Notes (Signed)
Telemedicine Encounter- SOAP NOTE Established Patient  This telephone encounter was conducted with the patient's (or proxy's) verbal consent via audio telecommunications: yes/no: Yes Patient was instructed to have this encounter in a suitably private space; and to only have persons present to whom they give permission to participate. In addition, patient identity was confirmed by use of name plus two identifiers (DOB and address).  I discussed the limitations, risks, security and privacy concerns of performing an evaluation and management service by telephone and the availability of in person appointments. I also discussed with the patient that there may be a patient responsible charge related to this service. The patient expressed understanding and agreed to proceed.  I spent a total of TIME; 0 MIN TO 60 MIN: 20 minutes talking with the patient or their proxy.  CC: ADD med check  Subjective   [name] [name] is a 28 y.o. established patient. Telephone visit today for  HPI  ADD Pt has a diagnosis of ADD/ADHD Pt is taking Adderall 20mg  twice daily and has good results Pt does not use other stimulants such as energy drinks or caffeine Pt is able to sleep at night and denies unintentional weight loss Pt  denies mood swings Pt skips doses when not working or on weekends  Patient Active Problem List   Diagnosis Date Noted  . Insomnia 12/09/2016  . ADHD (attention deficit hyperactivity disorder) 10/27/2014  . GAD (generalized anxiety disorder) 10/27/2014  . Other acne 05/03/2008  . Allergic rhinitis 01/27/2008    Past Medical History:  Diagnosis Date  . ADHD (attention deficit hyperactivity disorder)   . Anxiety   . Panic attacks   . Vaginal Pap smear, abnormal     Current Outpatient Medications  Medication Sig Dispense Refill  . albuterol (PROVENTIL HFA;VENTOLIN HFA) 108 (90 Base) MCG/ACT inhaler Inhale 1-2 puffs into the lungs every 6 (six) hours as needed for wheezing or  shortness of breath. 1 Inhaler 11  . amphetamine-dextroamphetamine (ADDERALL) 20 MG tablet TAKE 1 TABLET BY MOUTH TWICE DAILY. 60 tablet 0  . benzonatate (TESSALON) 100 MG capsule Take 1-2 capsules (100-200 mg total) by mouth 3 (three) times daily as needed for cough. (Patient not taking: Reported on 01/14/2019) 40 capsule 0  . fluticasone (FLONASE) 50 MCG/ACT nasal spray Place 1 spray into both nostrils 2 (two) times daily. 16 g 4  . LORazepam (ATIVAN) 0.5 MG tablet TAKE 1 TABLET DAILY AS NEEDED FOR ANXIETY. (Patient not taking: Reported on 01/14/2019) 10 tablet 0   No current facility-administered medications for this visit.     Allergies  Allergen Reactions  . Ceclor [Cefaclor] Hives    Age 63 mild rash    Social History   Socioeconomic History  . Marital status: Married    Spouse name: Not on file  . Number of children: Not on file  . Years of education: Not on file  . Highest education level: Not on file  Occupational History  . Not on file  Social Needs  . Financial resource strain: Not on file  . Food insecurity:    Worry: Not on file    Inability: Not on file  . Transportation needs:    Medical: Not on file    Non-medical: Not on file  Tobacco Use  . Smoking status: Former Smoker    Types: Cigarettes    Last attempt to quit: 08/15/2013    Years since quitting: 5.4  . Smokeless tobacco: Never Used  Substance and Sexual  Activity  . Alcohol use: No    Alcohol/week: 2.0 standard drinks    Types: 2 Standard drinks or equivalent per week    Comment: socially, not with preg  . Drug use: No  . Sexual activity: Not Currently    Birth control/protection: None  Lifestyle  . Physical activity:    Days per week: Not on file    Minutes per session: Not on file  . Stress: Not on file  Relationships  . Social connections:    Talks on phone: Not on file    Gets together: Not on file    Attends religious service: Not on file    Active member of club or organization: Not on  file    Attends meetings of clubs or organizations: Not on file    Relationship status: Not on file  . Intimate partner violence:    Fear of current or ex partner: Not on file    Emotionally abused: Not on file    Physically abused: Not on file    Forced sexual activity: Not on file  Other Topics Concern  . Not on file  Social History Narrative  . Not on file    ROS Review of Systems  Constitutional: Negative for activity change, appetite change, chills and fever.  HENT: Negative for congestion, nosebleeds, trouble swallowing and voice change.   Respiratory: Negative for cough, shortness of breath and wheezing.   Gastrointestinal: Negative for diarrhea, nausea and vomiting.  Genitourinary: Negative for difficulty urinating, dysuria, flank pain and hematuria.  Musculoskeletal: Negative for back pain, joint swelling and neck pain.  Neurological: Negative for dizziness, speech difficulty, light-headedness and numbness.  See HPI. All other review of systems negative.   Objective   Vitals as reported by the patient: There were no vitals filed for this visit.  Diagnoses and all orders for this visit:  Attention deficit hyperactivity disorder (ADHD), combined type  -  PMP aware reviewed Pt stable on current dose with mood swings, anorexia or unintentional weight loss No abberrant use noted  Reviewed common adverse reactions   Other orders -     fluticasone (FLONASE) 50 MCG/ACT nasal spray; Place 1 spray into both nostrils 2 (two) times daily.     I discussed the assessment and treatment plan with the patient. The patient was provided an opportunity to ask questions and all were answered. The patient agreed with the plan and demonstrated an understanding of the instructions.   The patient was advised to call back or seek an in-person evaluation if the symptoms worsen or if the condition fails to improve as anticipated.  I provided 20 minutes of non-face-to-face time during  this encounter.  Doristine Bosworth, MD  Primary Care at [facility]

## 2019-01-14 NOTE — Progress Notes (Signed)
Pt c/o medication refill of adderall.

## 2019-02-04 ENCOUNTER — Other Ambulatory Visit: Payer: Self-pay | Admitting: Family Medicine

## 2019-02-04 DIAGNOSIS — F902 Attention-deficit hyperactivity disorder, combined type: Secondary | ICD-10-CM

## 2019-02-04 NOTE — Telephone Encounter (Signed)
Requested medication (s) are due for refill today -yes  Requested medication (s) are on the active medication list -yes  Future visit scheduled -no  Last refill: lorazepam- 11/02/18                 adderall- 01/11/19  Notes to clinic: Patient is requesting refill of non delegated Rx- sent for PCP review of request  Requested Prescriptions  Pending Prescriptions Disp Refills   LORazepam (ATIVAN) 0.5 MG tablet [Pharmacy Med Name: LORAZEPAM 0.5 MG TABLET] 10 tablet 0    Sig: TAKE 1 TABLET DAILY AS NEEDED FOR ANXIETY.     Not Delegated - Psychiatry:  Anxiolytics/Hypnotics Failed - 02/04/2019  1:54 PM      Failed - This refill cannot be delegated      Failed - Urine Drug Screen completed in last 360 days.      Failed - Valid encounter within last 6 months    Recent Outpatient Visits          6 months ago Attention deficit hyperactivity disorder (ADHD), combined type   Primary Care at [facility], Manus Rudd, MD   7 months ago Injury of coccyx, initial encounter   Primary Care at [place], [place] D, PA-C   10 months ago Attention deficit hyperactivity disorder (ADHD), combined type   Primary Care at [facility], Manus Rudd, MD   11 months ago Attention deficit hyperactivity disorder (ADHD), combined type   [facility] at [facility], Meda Coffee, MD   11 months ago Pneumonia of right middle lobe due to infectious organism [facility])   Primary Care at [facility], [place] D, PA-C            amphetamine-dextroamphetamine (ADDERALL) 20 MG tablet [Pharmacy Med Name: DEXTROAMP-AMPHETAMIN 20 MG TAB] 60 tablet 0    Sig: TAKE 1 TABLET BY MOUTH TWICE DAILY.     Not Delegated - Psychiatry:  Stimulants/ADHD Failed - 02/04/2019  1:54 PM      Failed - This refill cannot be delegated      Failed - Urine Drug Screen completed in last 360 days.      Failed - Valid encounter within last 3 months    Recent Outpatient Visits          6 months ago Attention deficit hyperactivity disorder  (ADHD), combined type   Primary Care at [facility], Manus Rudd, MD   7 months ago Injury of coccyx, initial encounter   Primary Care at [place], [place] D, PA-C   10 months ago Attention deficit hyperactivity disorder (ADHD), combined type   Primary Care at [facility], Manus Rudd, MD   11 months ago Attention deficit hyperactivity disorder (ADHD), combined type   [facility] at [facility], Meda Coffee, MD   11 months ago Pneumonia of right middle lobe due to infectious organism [facility])   Primary Care at [facility], [place] D, PA-C              Requested Prescriptions  Pending Prescriptions Disp Refills   LORazepam (ATIVAN) 0.5 MG tablet [Pharmacy Med Name: LORAZEPAM 0.5 MG TABLET] 10 tablet 0    Sig: TAKE 1 TABLET DAILY AS NEEDED FOR ANXIETY.     Not Delegated - Psychiatry:  Anxiolytics/Hypnotics Failed - 02/04/2019  1:54 PM      Failed - This refill cannot be delegated      Failed - Urine Drug Screen completed in last 360 days.      Failed -  Valid encounter within last 6 months    Recent Outpatient Visits          6 months ago Attention deficit hyperactivity disorder (ADHD), combined type   [facility], Manus RuddZoe A, MD   7 months ago Injury of coccyx, initial encounter   [facility], [place] D, PA-C   10 months ago Attention deficit hyperactivity disorder (ADHD), combined type   [facility], Manus RuddZoe A, MD   11 months ago Attention deficit hyperactivity disorder (ADHD), combined type   [facility], Meda CoffeeIrma M, MD   11 months ago Pneumonia of right middle lobe due to infectious organism [facility](HCC)   [facility], [place] D, PA-C            amphetamine-dextroamphetamine (ADDERALL) 20 MG tablet [Pharmacy Med Name: DEXTROAMP-AMPHETAMIN 20 MG TAB] 60 tablet 0    Sig: TAKE 1 TABLET BY MOUTH TWICE DAILY.     Not Delegated - Psychiatry:  Stimulants/ADHD Failed - 02/04/2019  1:54 PM       Failed - This refill cannot be delegated      Failed - Urine Drug Screen completed in last 360 days.      Failed - Valid encounter within last 3 months    Recent Outpatient Visits          6 months ago Attention deficit hyperactivity disorder (ADHD), combined type   [facility], Manus RuddZoe A, MD   7 months ago Injury of coccyx, initial encounter   [facility], [place] D, PA-C   10 months ago Attention deficit hyperactivity disorder (ADHD), combined type   [facility], Manus RuddZoe A, MD   11 months ago Attention deficit hyperactivity disorder (ADHD), combined type   [facility], Meda CoffeeIrma M, MD   11 months ago Pneumonia of right middle lobe due to infectious organism [facility](HCC)   [facility], [place] D, PA-C

## 2019-02-08 NOTE — Telephone Encounter (Signed)
Patient calling in regards to the Lorazepam refill that was denied. States that she is needing this prescription. Advised that it was refused do to patient stating she was not taking the medication on 01/14/2019. Patient states that she did not say that she was not taking the medication any longer. States that she said she does not have to take it every day. States that due to quarantine, she does not want to be without this medication with taking care of her 2 kids while her husband is away for work. Please advise. Would like to get this medication refilled asap. CB#: [phone]   Or    [phone]

## 2019-02-10 ENCOUNTER — Telehealth: Payer: Self-pay | Admitting: Family Medicine

## 2019-02-10 NOTE — Telephone Encounter (Signed)
Copied from CRM [id number]. Topic: Quick Communication - Rx Refill/Question >> Feb 10, 2019 12:44 PM Deborha Payment wrote: Medication: amphetamine-dextroamphetamine (ADDERALL) 20 MG tablet   Has the patient contacted their pharmacy? Yes no more refills  Preferred Pharmacy (with phone number or street name): [facility] - [place], [place] - [address] [phone] (Phone) [phone] (Fax)    Agent: Please be advised that RX refills may take up to 3 business days. We ask that you follow-up with your pharmacy.

## 2019-02-11 ENCOUNTER — Other Ambulatory Visit: Payer: Self-pay | Admitting: Family Medicine

## 2019-02-11 MED ORDER — LORAZEPAM 0.5 MG PO TABS: ORAL_TABLET | ORAL | 0 refills | Status: DC

## 2019-02-11 NOTE — Telephone Encounter (Signed)
Informed pt of Rx being sent to pharmacy, she verbalized understanding.

## 2019-03-14 ENCOUNTER — Other Ambulatory Visit: Payer: Self-pay | Admitting: Family Medicine

## 2019-03-14 DIAGNOSIS — F902 Attention-deficit hyperactivity disorder, combined type: Secondary | ICD-10-CM

## 2019-03-14 NOTE — Telephone Encounter (Signed)
Please advise 

## 2019-03-18 NOTE — Telephone Encounter (Signed)
Heather with [facility] calling to check status of getting this refill for the patient. Please advise.

## 2019-03-21 ENCOUNTER — Telehealth: Payer: Self-pay | Admitting: Family Medicine

## 2019-03-21 NOTE — Telephone Encounter (Signed)
Medication: amphetamine-dextroamphetamine (ADDERALL) 20 MG tablet [[id number]] Patient has not had her medication in 4 days. States that they pharmacy has not heard back Has the patient contacted their pharmacy? Yes  (Agent: If no, request that the patient contact the pharmacy for the refill.) (Agent: If yes, when and what did the pharmacy advise?)  Preferred Pharmacy (with phone number or street name): [place], [place]. [phone] (Phone) [phone] (Fax)    Agent: Please be advised that RX refills may take up to 3 business days. We ask that you follow-up with your pharmacy.

## 2019-03-22 ENCOUNTER — Other Ambulatory Visit: Payer: Self-pay | Admitting: Family Medicine

## 2019-03-22 DIAGNOSIS — F902 Attention-deficit hyperactivity disorder, combined type: Secondary | ICD-10-CM

## 2019-03-22 NOTE — Telephone Encounter (Signed)
Please advise 

## 2019-03-22 NOTE — Telephone Encounter (Signed)
Do pt need appointment or can you send a refill for this medication?

## 2019-03-24 NOTE — Telephone Encounter (Signed)
Patient checking on the status of medication request mentioned below, patient would like a follow up call today. Patient states she had a telemedicine appointment on 01/14/2019 therefore patient states an appointment should not be needed, please advise   amphetamine-dextroamphetamine (ADDERALL) 20 MG tablet    [place], [place] [phone] (Phone) [phone] (Fax)

## 2019-03-25 NOTE — Telephone Encounter (Signed)
Called and informed pt that Rx has been sent to pharmacy.  

## 2019-04-18 ENCOUNTER — Other Ambulatory Visit: Payer: Self-pay | Admitting: Family Medicine

## 2019-04-18 DIAGNOSIS — F902 Attention-deficit hyperactivity disorder, combined type: Secondary | ICD-10-CM

## 2019-04-18 NOTE — Telephone Encounter (Signed)
Please advise on med refills.   Controlled, Ativan and Adderall  Last seen: 08/06/18 F/u Appt: 05/02/19  Last filled on 7/920 Adderall = #60 Ativan = #10

## 2019-04-18 NOTE — Telephone Encounter (Signed)
Requested medications are due for refill today?  Yes  Requested medications are on the active medication list?  Yes  Last refill 03/24/2019  Future visit scheduled?  Yes - 05/02/2019  Notes to clinic   Requested Prescriptions  Pending Prescriptions Disp Refills   LORazepam (ATIVAN) 0.5 MG tablet [Pharmacy Med Name: LORAZEPAM 0.5 MG TABLET] 10 tablet 0    Sig: TAKE 1 TABLET DAILY AS NEEDED FOR ANXIETY.     Not Delegated - Psychiatry:  Anxiolytics/Hypnotics Failed - 04/18/2019  8:54 AM      Failed - This refill cannot be delegated      Failed - Urine Drug Screen completed in last 360 days.      Failed - Valid encounter within last 6 months    Recent Outpatient Visits          8 months ago Attention deficit hyperactivity disorder (ADHD), combined type   Primary Care at [facility], Arlie Solomons, MD   9 months ago Injury of coccyx, initial encounter   Primary Care at [facility], [place] D, PA-C   1 year ago Attention deficit hyperactivity disorder (ADHD), combined type   Primary Care at [facility], Arlie Solomons, MD   1 year ago Attention deficit hyperactivity disorder (ADHD), combined type   [facility] at [facility], Lilia Argue, MD   1 year ago Pneumonia of right middle lobe due to infectious organism [facility])   Primary Care at [facility], [place] D, PA-C              amphetamine-dextroamphetamine (ADDERALL) 20 MG tablet [Pharmacy Med Name: DEXTROAMP-AMPHETAMIN 20 MG TAB] 60 tablet 0    Sig: TAKE 1 TABLET BY MOUTH TWICE DAILY.     Not Delegated - Psychiatry:  Stimulants/ADHD Failed - 04/18/2019  8:54 AM      Failed - This refill cannot be delegated      Failed - Urine Drug Screen completed in last 360 days.      Failed - Valid encounter within last 3 months    Recent Outpatient Visits          8 months ago Attention deficit hyperactivity disorder (ADHD), combined type   Primary Care at [facility], Arlie Solomons, MD   9 months ago Injury of coccyx, initial encounter   Primary Care at [facility], [place] D, PA-C   1 year ago Attention deficit hyperactivity disorder (ADHD), combined type   Primary Care at [facility], Arlie Solomons, MD   1 year ago Attention deficit hyperactivity disorder (ADHD), combined type   [facility] at [facility], Lilia Argue, MD   1 year ago Pneumonia of right middle lobe due to infectious organism [facility])   Primary Care at [facility], [place] D, PA-C

## 2019-05-02 ENCOUNTER — Other Ambulatory Visit: Payer: Self-pay

## 2019-05-02 ENCOUNTER — Telehealth: Payer: BLUE CROSS/BLUE SHIELD | Admitting: Family Medicine

## 2019-05-10 ENCOUNTER — Ambulatory Visit: Payer: Self-pay | Admitting: Family Medicine

## 2019-05-16 ENCOUNTER — Other Ambulatory Visit (HOSPITAL_COMMUNITY)
Admission: RE | Admit: 2019-05-16 | Discharge: 2019-05-16 | Disposition: A | Discharge status: Home / Self Care | Payer: BC Managed Care – PPO | Source: Ambulatory Visit | Attending: Family Medicine | Admitting: Family Medicine

## 2019-05-16 ENCOUNTER — Telehealth: Payer: Self-pay

## 2019-05-16 ENCOUNTER — Ambulatory Visit (INDEPENDENT_AMBULATORY_CARE_PROVIDER_SITE_OTHER): Payer: BC Managed Care – PPO | Admitting: Family Medicine

## 2019-05-16 ENCOUNTER — Other Ambulatory Visit: Payer: Self-pay

## 2019-05-16 ENCOUNTER — Encounter: Payer: Self-pay | Admitting: Family Medicine

## 2019-05-16 VITALS — BP 118/80 | HR 77 | Temp 98.7°F | Ht 67.5 in | Wt 208.0 lb

## 2019-05-16 DIAGNOSIS — Z113 Encounter for screening for infections with a predominantly sexual mode of transmission: Secondary | ICD-10-CM | POA: Diagnosis not present

## 2019-05-16 DIAGNOSIS — F1120 Opioid dependence, uncomplicated: Secondary | ICD-10-CM

## 2019-05-16 DIAGNOSIS — Z124 Encounter for screening for malignant neoplasm of cervix: Secondary | ICD-10-CM

## 2019-05-16 DIAGNOSIS — Z1151 Encounter for screening for human papillomavirus (HPV): Secondary | ICD-10-CM | POA: Diagnosis not present

## 2019-05-16 DIAGNOSIS — Z01411 Encounter for gynecological examination (general) (routine) with abnormal findings: Secondary | ICD-10-CM

## 2019-05-16 DIAGNOSIS — Z111 Encounter for screening for respiratory tuberculosis: Secondary | ICD-10-CM | POA: Diagnosis not present

## 2019-05-16 DIAGNOSIS — Z23 Encounter for immunization: Secondary | ICD-10-CM

## 2019-05-16 MED ORDER — FLUTICASONE PROPIONATE 50 MCG/ACT NA SUSP: 1.0000 | Freq: Two times a day (BID) | NASAL | 4 refills | Status: DC

## 2019-05-16 NOTE — Progress Notes (Signed)
8/31/202010:35 AM  [name] [name] 17-Jul-1991, 28 y.o., female [id number]  Chief Complaint  Patient presents with  . Gynecologic Exam  . Medication Refill    flonase    HPI:   Patient is a 28 y.o. 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 0 0 0 No No  Number falls in past yr: 0 - - - -  Injury with Fall? 0 - - - -     Allergies  Allergen Reactions  . Ceclor [Cefaclor] Hives    Age 60 mild rash    Prior to Admission medications   Medication Sig Start Date End Date Taking? Authorizing Provider  albuterol (PROVENTIL HFA;VENTOLIN HFA) 108 (90 Base) MCG/ACT inhaler Inhale 1-2 puffs into the lungs every 6 (six) hours as needed for wheezing or shortness of breath. 11/27/18  Yes Robyn Haber, MD  amphetamine-dextroamphetamine (ADDERALL) 20 MG tablet TAKE 1 TABLET BY MOUTH TWICE DAILY. 04/20/19  Yes Stallings, Zoe A, MD  fluticasone (FLONASE) 50 MCG/ACT nasal spray Place 1 spray into both nostrils 2 (two) times daily. 01/14/19  Yes Stallings, Zoe A, MD  LORazepam (ATIVAN) 0.5 MG tablet TAKE 1 TABLET DAILY AS NEEDED FOR ANXIETY. 04/20/19  Yes Forrest Moron, MD    Past Medical History:  Diagnosis Date  . ADHD (attention deficit hyperactivity disorder)   . Anxiety   . Panic attacks   . Vaginal Pap smear,  abnormal     Past Surgical History:  Procedure Laterality Date  . DILATION AND CURETTAGE OF UTERUS  01/04/16  . DILATION AND EVACUATION N/A 01/04/2016   Procedure: DILATATION AND EVACUATION;  Surgeon: Louretta Shorten, MD;  Location: [facility];  Service: Gynecology;  Laterality: N/A;  . GUM SURGERY  01/2017  . INDUCED ABORTION    . wisdom teeth removal Bilateral    2010    Social History   Tobacco Use  . Smoking status: Former Smoker    Types: Cigarettes    Quit date: 08/15/2013    Years since quitting: 5.7  . Smokeless tobacco: Never Used  Substance Use Topics  . Alcohol use: No    Alcohol/week: 2.0 standard drinks    Types: 2 Standard drinks or equivalent per week    Comment: socially, not with preg    Family History  Problem Relation Age of Onset  . Hypertension Mother   . Heart disease Father   . Cancer Father        testicular  . Hypertension Father   . Hyperlipidemia Father   . Heart disease Paternal Grandfather     Review of Systems  Constitutional: Negative for chills and fever.  Respiratory: Negative for cough and shortness of breath.   Cardiovascular: Negative for chest pain, palpitations and leg swelling.  Gastrointestinal: Negative for abdominal  pain, nausea and vomiting.   Per hpi  OBJECTIVE:  Today's Vitals   05/16/19 1014  BP: 118/80  Pulse: 77  Temp: 98.7 F (37.1 C)  TempSrc: Oral  SpO2: 98%  Weight: 208 lb (94.3 kg)  Height: 5' 7.5" (1.715 m)   Body mass index is 32.1 kg/m.   Physical Exam Vitals signs and nursing note reviewed. Exam conducted with a chaperone present.  Constitutional:      Appearance: She is well-developed.  HENT:     Head: Normocephalic and atraumatic.  Eyes:     General: No scleral icterus.    Conjunctiva/sclera: Conjunctivae normal.     Pupils: Pupils are equal, round, and reactive to light.  Neck:     Musculoskeletal: Neck supple.  Pulmonary:     Effort: Pulmonary effort is normal.  Genitourinary:    General:  Normal vulva.     Vagina: Normal.     Cervix: Normal.     Comments: Very difficult exam  Declined bimanual Skin:    General: Skin is warm and dry.  Neurological:     Mental Status: She is alert and oriented to person, place, and time.     No results found for this or any previous visit (from the past 24 hour(s)).  No results found.   ASSESSMENT and PLAN 1. Cervical cancer screening - Cytology - PAP  2. Screen for STD (sexually transmitted disease) - gc/ch from pap - HIV - Hep C - RPR  3. Screening for tuberculosis - TB Skin Test - ppd placed on 05/16/2019 patient never returned for read  4. Flu vaccine need - Flu Vaccine QUAD 36+ mos IM  Other orders - fluticasone (FLONASE) 50 MCG/ACT nasal spray; Place 1 spray into both nostrils 2 (two) times daily.  Return for 2 days for nurse PPD read, otherwise one year or sooner pending pap results.    Myles LippsIrma M Santiago, MD Primary Care at [facility] [address] [place], [place] [address] Ph.  [phone] Fax [phone]

## 2019-05-16 NOTE — Telephone Encounter (Signed)
Sent med list to [facility] as requested

## 2019-05-16 NOTE — Patient Instructions (Addendum)
     If you have lab work done today you will be contacted with your lab results within the next 2 weeks.  If you have not heard from us then please contact us. The fastest way to get your results is to register for My Chart.   IF you received an x-ray today, you will receive an invoice from [facility]. Please contact [facility] at [phone] with questions or concerns regarding your invoice.   IF you received labwork today, you will receive an invoice from [facility]. Please contact [facility] at [phone] with questions or concerns regarding your invoice.   Our billing staff will not be able to assist you with questions regarding bills from these companies.  You will be contacted with the lab results as soon as they are available. The fastest way to get your results is to activate your My Chart account. Instructions are located on the last page of this paperwork. If you have not heard from us regarding the results in 2 weeks, please contact this office.     Tuberculosis Risk Questionnaire  1. No Were you born outside the [place] in one of the following parts of the world: [place], Asia, Central [place], [place] America or Eastern [place]?    2. No Have you traveled outside the [place] and lived for more than one month in one of the following parts of the world: [place], [place], [place], [place] or Eastern [place]?    3. No Do you have a compromised immune system such as from any of the following conditions:HIV/AIDS, organ or bone marrow transplantation, diabetes, immunosuppressive medicines (e.g. Prednisone, Remicaide), leukemia, lymphoma, cancer of the head or neck, gastrectomy or jejunal bypass, end-stage renal disease (on dialysis), or silicosis?     4. No Have you ever or do you plan on working in: a residential care center, a health care facility, a jail or prison or homeless shelter?    5. No Have you ever: injected illegal drugs, used crack cocaine, lived in  a homeless shelter  or been in jail or prison?     6. No Have you ever been exposed to anyone with infectious tuberculosis?  7. No Have you ever had a BCG vaccine? (BCG is a vaccine for tuberculosis  (TB) used in OTHER countries, NOT in the [place]).  8. No Have you ever been advised by a health care provider NOT to have a TB skin test?  9. No Have you ever had a POSITIVE TB skin test?  IF SO, when? n/a  IF SO, were you treated with INH? n/a  IF SO, where? n/a  Tuberculosis Symptom Questionnaire  Do you currently have any of the following symptoms?  1. No Unexplained cough lasting more than 3 weeks?   2. No Unexplained fever lasting more than 3 weeks.   3. No Night Sweats (sweating that leaves the bedclothes and sheets wet)     4. No Shortness of Breath   5. No Chest Pain   6. No Unintentional weight loss    7. No Unexplained fatigue (very tired for no reason)   

## 2019-05-17 LAB — CYTOLOGY - PAP
Chlamydia: NEGATIVE
Diagnosis: NEGATIVE
Neisseria Gonorrhea: NEGATIVE

## 2019-05-24 ENCOUNTER — Telehealth: Payer: Self-pay

## 2019-05-24 ENCOUNTER — Ambulatory Visit: Payer: BC Managed Care – PPO

## 2019-05-24 ENCOUNTER — Other Ambulatory Visit: Payer: Self-pay | Admitting: Family Medicine

## 2019-05-24 DIAGNOSIS — F902 Attention-deficit hyperactivity disorder, combined type: Secondary | ICD-10-CM

## 2019-05-24 NOTE — Telephone Encounter (Signed)
Patient is requesting a refill of the following medications: Requested Prescriptions   Pending Prescriptions Disp Refills  . amphetamine-dextroamphetamine (ADDERALL) 20 MG tablet [Pharmacy Med Name: DEXTROAMP-AMPHETAMIN 20 MG TAB] 60 tablet 0    Sig: TAKE 1 TABLET BY MOUTH TWICE DAILY.  Marland Kitchen LORazepam (ATIVAN) 0.5 MG tablet [Pharmacy Med Name: LORAZEPAM 0.5 MG TABLET] 5 tablet 0    Sig: TAKE 1 TABLET DAILY AS NEEDED FOR ANXIETY.    Date of patient request: 05/24/19 Last office visit: 01/14/19 Date of last refill: 04/20/19 Last refill amount: #60 and #5 Follow up time period per chart: none

## 2019-05-24 NOTE — Telephone Encounter (Signed)
Pt did not return for the reading of tb placement

## 2019-05-25 NOTE — Telephone Encounter (Signed)
Deferring to you. Patient recently started methadone treatment.

## 2019-05-25 NOTE — Telephone Encounter (Signed)
Please let the patient know that there is an interaction between methadone and adderall.  I am not comfortable with how to dose this and ativan while on methadone. She should see Psychiatry to help with this. There is a [facility] and urgent care that she can contact if the methadone clinic does not have someone.

## 2019-05-26 ENCOUNTER — Telehealth: Payer: Self-pay | Admitting: Family Medicine

## 2019-05-26 ENCOUNTER — Other Ambulatory Visit: Payer: Self-pay | Admitting: Family Medicine

## 2019-05-26 ENCOUNTER — Ambulatory Visit (INDEPENDENT_AMBULATORY_CARE_PROVIDER_SITE_OTHER): Payer: BC Managed Care – PPO | Admitting: Family Medicine

## 2019-05-26 ENCOUNTER — Other Ambulatory Visit: Payer: Self-pay

## 2019-05-26 DIAGNOSIS — F902 Attention-deficit hyperactivity disorder, combined type: Secondary | ICD-10-CM

## 2019-05-26 DIAGNOSIS — Z1329 Encounter for screening for other suspected endocrine disorder: Secondary | ICD-10-CM

## 2019-05-26 DIAGNOSIS — Z113 Encounter for screening for infections with a predominantly sexual mode of transmission: Secondary | ICD-10-CM | POA: Diagnosis not present

## 2019-05-26 DIAGNOSIS — Z5181 Encounter for therapeutic drug level monitoring: Secondary | ICD-10-CM

## 2019-05-26 DIAGNOSIS — Z111 Encounter for screening for respiratory tuberculosis: Secondary | ICD-10-CM

## 2019-05-26 NOTE — Telephone Encounter (Signed)
Requested medication (s) are due for refill today: yes  Requested medication (s) are on the active medication list: yes  Last refill:  04/20/2019  Future visit scheduled: no  Notes to clinic:  This refill cannot be delegated   Requested Prescriptions  Pending Prescriptions Disp Refills   LORazepam (ATIVAN) 0.5 MG tablet [Pharmacy Med Name: LORAZEPAM 0.5 MG TABLET] 5 tablet 0    Sig: TAKE 1 TABLET DAILY AS NEEDED FOR ANXIETY.     Not Delegated - Psychiatry:  Anxiolytics/Hypnotics Failed - 05/26/2019 10:44 AM      Failed - This refill cannot be delegated      Failed - Urine Drug Screen completed in last 360 days.      Passed - Valid encounter within last 6 months    Recent Outpatient Visits          Today Screening for tuberculosis   Primary Care at [facility], Lilia Argue, MD   1 week ago Cervical cancer screening   Primary Care at [facility], Lilia Argue, MD   4 months ago Attention deficit hyperactivity disorder (ADHD), combined type   Primary Care at [facility], Arlie Solomons, MD   9 months ago Attention deficit hyperactivity disorder (ADHD), combined type   Primary Care at [facility], Arlie Solomons, MD   10 months ago Injury of coccyx, initial encounter   Primary Care at [facility], [place] D, PA-C              amphetamine-dextroamphetamine (ADDERALL) 20 MG tablet [Pharmacy Med Name: DEXTROAMP-AMPHETAMIN 20 MG TAB] 60 tablet 0    Sig: TAKE 1 TABLET BY MOUTH TWICE DAILY.     Not Delegated - Psychiatry:  Stimulants/ADHD Failed - 05/26/2019 10:44 AM      Failed - This refill cannot be delegated      Failed - Urine Drug Screen completed in last 360 days.      Passed - Valid encounter within last 3 months    Recent Outpatient Visits          Today Screening for tuberculosis   Primary Care at [facility], Lilia Argue, MD   1 week ago Cervical cancer screening   Primary Care at [facility], Lilia Argue, MD   4 months ago Attention deficit hyperactivity disorder (ADHD),  combined type   Primary Care at [facility], Arlie Solomons, MD   9 months ago Attention deficit hyperactivity disorder (ADHD), combined type   Primary Care at [facility], Arlie Solomons, MD   10 months ago Injury of coccyx, initial encounter   Primary Care at [facility], [place] D, PA-C

## 2019-05-26 NOTE — Telephone Encounter (Signed)
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 I have placed it in the providers box in the provider lounge.

## 2019-05-27 LAB — RPR: RPR Ser Ql: NONREACTIVE

## 2019-05-27 LAB — HEPATITIS C ANTIBODY: Hep C Virus Ab: <0.1 s/co ratio (ref 0.0–0.9)

## 2019-05-27 LAB — HIV ANTIBODY (ROUTINE TESTING W REFLEX): HIV Screen 4th Generation wRfx: NONREACTIVE

## 2019-05-28 LAB — QUANTIFERON-TB GOLD PLUS
QuantiFERON Mitogen Value: >10 IU/mL
QuantiFERON Nil Value: 0.02 IU/mL
QuantiFERON TB1 Ag Value: 0.04 IU/mL
QuantiFERON TB2 Ag Value: 0.04 IU/mL
QuantiFERON-TB Gold Plus: NEGATIVE

## 2019-05-28 LAB — TSH: TSH: 4.15 u[IU]/mL (ref 0.450–4.500)

## 2019-05-29 LAB — TOXASSURE SELECT 13 (MW), URINE

## 2019-05-31 NOTE — Telephone Encounter (Signed)
Patient is requesting a refill of the following medications: Requested Prescriptions   Pending Prescriptions Disp Refills  . LORazepam (ATIVAN) 0.5 MG tablet [Pharmacy Med Name: LORAZEPAM 0.5 MG TABLET] 5 tablet 0    Sig: TAKE 1 TABLET DAILY AS NEEDED FOR ANXIETY.  Marland Kitchen amphetamine-dextroamphetamine (ADDERALL) 20 MG tablet [Pharmacy Med Name: DEXTROAMP-AMPHETAMIN 20 MG TAB] 60 tablet 0    Sig: TAKE 1 TABLET BY MOUTH TWICE DAILY.    Date of patient request:05/26/2019 Last office visit:05/16/2019 Santiago-pap Date of last refill: adderall 04/20/2019, lorazepam 04/20/2019 Last refill amount: adderall #60 and lorazepam #5 Follow up time period per chart:

## 2019-06-01 ENCOUNTER — Other Ambulatory Visit: Payer: Self-pay | Admitting: Family Medicine

## 2019-06-01 DIAGNOSIS — F902 Attention-deficit hyperactivity disorder, combined type: Secondary | ICD-10-CM

## 2019-06-01 NOTE — Telephone Encounter (Signed)
Pt called to see if the refills can be taken care of today/ Pt states she was told it would be sent when she was seen last week. Pt request refill for amphetamine-dextroamphetamine (ADDERALL) 20 MG tablet  And LORazepam (ATIVAN) 0.5 MG tablet  Sent to  [place] [place] [place] (Phone) [phone] (Fax)   Please advise

## 2019-06-01 NOTE — Telephone Encounter (Signed)
Requested Prescriptions   Pending Prescriptions Disp Refills  . LORazepam (ATIVAN) 0.5 MG tablet 5 tablet 0    Sig: TAKE 1 TABLET DAILY AS NEEDED FOR ANXIETY  . amphetamine-dextroamphetamine (ADDERALL) 20 MG tablet 60 tablet 0    Sig: Take 1 tablet (20 mg total) by mouth 2 (two) times daily.     Last OV 05/26/2019  Last written 04/20/2019

## 2019-06-02 NOTE — Telephone Encounter (Signed)
Requested Prescriptions   Pending Prescriptions Disp Refills  . LORazepam (ATIVAN) 0.5 MG tablet 5 tablet 0    Sig: TAKE 1 TABLET DAILY AS NEEDED FOR ANXIETY.    Last OV 05/26/2019  Last written 04/20/2019

## 2019-06-03 ENCOUNTER — Telehealth: Payer: Self-pay

## 2019-06-03 ENCOUNTER — Other Ambulatory Visit: Payer: Self-pay

## 2019-06-03 NOTE — Telephone Encounter (Signed)
Spoke with pt advised form completed and ready for p/u at front desk.  Pt agreeable but is asking about adderall refill request.  Advised would send message to dr Nolon Rod regarding refill request. Dgaddy, CMA

## 2019-06-03 NOTE — Telephone Encounter (Signed)
Pt contacted via phone an advised form completed and ready for p/u. Dgaddy, CMA

## 2019-06-05 ENCOUNTER — Other Ambulatory Visit: Payer: Self-pay | Admitting: Family Medicine

## 2019-06-05 DIAGNOSIS — F902 Attention-deficit hyperactivity disorder, combined type: Secondary | ICD-10-CM

## 2019-06-05 MED ORDER — AMPHETAMINE-DEXTROAMPHETAMINE 20 MG PO TABS: 20.0000 mg | ORAL_TABLET | Freq: Two times a day (BID) | ORAL | 0 refills | Status: DC

## 2019-06-05 NOTE — Progress Notes (Signed)
Please notify the patient of her medication being ready for refilled.

## 2019-06-21 ENCOUNTER — Other Ambulatory Visit: Payer: Self-pay | Admitting: Family Medicine

## 2019-06-21 DIAGNOSIS — F902 Attention-deficit hyperactivity disorder, combined type: Secondary | ICD-10-CM

## 2019-06-21 NOTE — Telephone Encounter (Signed)
Requested medication (s) are due for refill today: yes  Requested medication (s) are on the active medication list: yes  Last refill:  04/20/2019  Future visit scheduled: no  Notes to clinic:  Refill cannot be delegated    Requested Prescriptions  Pending Prescriptions Disp Refills   LORazepam (ATIVAN) 0.5 MG tablet [Pharmacy Med Name: LORAZEPAM 0.5 MG TABLET] 5 tablet 0    Sig: TAKE 1 TABLET DAILY AS NEEDED FOR ANXIETY.     Not Delegated - Psychiatry:  Anxiolytics/Hypnotics Failed - 06/21/2019  8:46 AM      Failed - This refill cannot be delegated      Failed - Urine Drug Screen completed in last 360 days.      Passed - Valid encounter within last 6 months    Recent Outpatient Visits          3 weeks ago Screening for tuberculosis   Primary Care at [facility], Lilia Argue, MD   1 month ago Cervical cancer screening   Primary Care at [facility], Lilia Argue, MD   5 months ago Attention deficit hyperactivity disorder (ADHD), combined type   Primary Care at [facility], Arlie Solomons, MD   10 months ago Attention deficit hyperactivity disorder (ADHD), combined type   Primary Care at [facility], Arlie Solomons, MD   11 months ago Injury of coccyx, initial encounter   Primary Care at [facility], [place] D, PA-C

## 2019-06-21 NOTE — Telephone Encounter (Signed)
Requested medication (s) are due for refill today: yes  Requested medication (s) are on the active medication list: yes  Last refill:  06/05/2019  Future visit scheduled: no  Notes to clinic:  Refill cannot be delegated  Does not need 15 days wants to have on file for 10   Requested Prescriptions  Pending Prescriptions Disp Refills   amphetamine-dextroamphetamine (ADDERALL) 20 MG tablet [Pharmacy Med Name: DEXTROAMP-AMPHETAMIN 20 MG TAB] 60 tablet 0    Sig: TAKE 1 TABLET BY MOUTH TWICE DAILY.     Not Delegated - Psychiatry:  Stimulants/ADHD Failed - 06/21/2019  8:46 AM      Failed - This refill cannot be delegated      Failed - Urine Drug Screen completed in last 360 days.      Passed - Valid encounter within last 3 months    Recent Outpatient Visits          3 weeks ago Screening for tuberculosis   Primary Care at [facility], Lilia Argue, MD   1 month ago Cervical cancer screening   Primary Care at [facility], Lilia Argue, MD   5 months ago Attention deficit hyperactivity disorder (ADHD), combined type   Primary Care at [facility], Arlie Solomons, MD   10 months ago Attention deficit hyperactivity disorder (ADHD), combined type   Primary Care at [facility], Arlie Solomons, MD   11 months ago Injury of coccyx, initial encounter   Primary Care at [facility], [place] D, PA-C

## 2019-06-23 ENCOUNTER — Other Ambulatory Visit: Payer: Self-pay | Admitting: Family Medicine

## 2019-06-27 NOTE — Telephone Encounter (Signed)
Controlled REFILL  Please advise.  Last seen by Dr. Pamella Pert on 05/26/19 Last filled 04/20/19 #5 0 refills

## 2019-06-28 NOTE — Telephone Encounter (Signed)
Attempted to call patient by calling both numbers and the home phone is not working and voicemail is not available.   Patient is not to get any ATIVAN as she is on adderall and methadone.    She needs an office visit for med check to discuss her treatment.

## 2019-06-29 ENCOUNTER — Ambulatory Visit (INDEPENDENT_AMBULATORY_CARE_PROVIDER_SITE_OTHER): Payer: BC Managed Care – PPO | Admitting: Clinical

## 2019-06-29 DIAGNOSIS — F4323 Adjustment disorder with mixed anxiety and depressed mood: Secondary | ICD-10-CM

## 2019-07-05 ENCOUNTER — Ambulatory Visit (HOSPITAL_COMMUNITY)
Admission: EM | Admit: 2019-07-05 | Discharge: 2019-07-05 | Disposition: A | Discharge status: Home / Self Care | Payer: BC Managed Care – PPO | Attending: Urgent Care | Admitting: Urgent Care

## 2019-07-05 ENCOUNTER — Encounter (HOSPITAL_COMMUNITY): Payer: Self-pay | Admitting: Emergency Medicine

## 2019-07-05 ENCOUNTER — Other Ambulatory Visit: Payer: Self-pay

## 2019-07-05 DIAGNOSIS — R197 Diarrhea, unspecified: Secondary | ICD-10-CM

## 2019-07-05 DIAGNOSIS — R1084 Generalized abdominal pain: Secondary | ICD-10-CM

## 2019-07-05 DIAGNOSIS — Z3202 Encounter for pregnancy test, result negative: Secondary | ICD-10-CM

## 2019-07-05 DIAGNOSIS — A084 Viral intestinal infection, unspecified: Secondary | ICD-10-CM | POA: Diagnosis not present

## 2019-07-05 DIAGNOSIS — Z20828 Contact with and (suspected) exposure to other viral communicable diseases: Secondary | ICD-10-CM | POA: Insufficient documentation

## 2019-07-05 DIAGNOSIS — R112 Nausea with vomiting, unspecified: Secondary | ICD-10-CM | POA: Diagnosis present

## 2019-07-05 DIAGNOSIS — R109 Unspecified abdominal pain: Secondary | ICD-10-CM | POA: Diagnosis not present

## 2019-07-05 DIAGNOSIS — R1110 Vomiting, unspecified: Secondary | ICD-10-CM | POA: Diagnosis not present

## 2019-07-05 LAB — POCT URINALYSIS DIP (DEVICE)
Bilirubin Urine: NEGATIVE
Glucose, UA: NEGATIVE mg/dL
Ketones, ur: NEGATIVE mg/dL
Nitrite: NEGATIVE
Protein, ur: NEGATIVE mg/dL
Specific Gravity, Urine: 1.02 (ref 1.005–1.030)
Urobilinogen, UA: 0.2 mg/dL (ref 0.0–1.0)
pH: 7 (ref 5.0–8.0)

## 2019-07-05 LAB — POCT PREGNANCY, URINE: Preg Test, Ur: NEGATIVE

## 2019-07-05 MED ORDER — LOPERAMIDE HCL 2 MG PO CAPS: 2.0000 mg | ORAL_CAPSULE | Freq: Every day | ORAL | 0 refills | Status: DC | PRN

## 2019-07-05 MED ORDER — ONDANSETRON 4 MG PO TBDP
ORAL_TABLET | ORAL | Status: AC
  Filled 2019-07-05: qty 2

## 2019-07-05 MED ORDER — ONDANSETRON 4 MG PO TBDP
8.0000 mg | ORAL_TABLET | Freq: Once | ORAL | Status: AC
  Administered 2019-07-05: 8 mg via ORAL

## 2019-07-05 MED ORDER — ONDANSETRON 8 MG PO TBDP: 8.0000 mg | ORAL_TABLET | Freq: Three times a day (TID) | ORAL | 0 refills | Status: DC | PRN

## 2019-07-05 NOTE — ED Triage Notes (Signed)
Patient reports she is vomiting since Thursday.  6 episodes of vomiting today.  Reports diarrhea yesterday.  Patient has abdominal pain, low energy.

## 2019-07-05 NOTE — ED Notes (Signed)
Urine and nasal swab in lab

## 2019-07-05 NOTE — ED Provider Notes (Signed)
MRN: [id number] DOB: October 24, 1990  Subjective:   [name] [name] is a 28 y.o. female presenting for 4 day history of acute onset persistent moderate-severe n/v, diarrhea. Has multiple episodes of vomiting and diarrhea. Thought it was GI illness, food poisoning. Has done light meals, tried to stay hydrated. Denies alcohol use. Denies smoking cigarettes. Denies COVID 19 contacts. Has done social distancing well with family. Has an IUD, has not had sex since about March 2020.    No current facility-administered medications for this encounter.   Current Outpatient Medications:  .  albuterol (PROVENTIL HFA;VENTOLIN HFA) 108 (90 Base) MCG/ACT inhaler, Inhale 1-2 puffs into the lungs every 6 (six) hours as needed for wheezing or shortness of breath., Disp: 1 Inhaler, Rfl: 11 .  amphetamine-dextroamphetamine (ADDERALL) 20 MG tablet, TAKE 1 TABLET BY MOUTH TWICE DAILY., Disp: 60 tablet, Rfl: 0 .  fluticasone (FLONASE) 50 MCG/ACT nasal spray, Place 1 spray into both nostrils 2 (two) times daily., Disp: 16 g, Rfl: 4 .  LORazepam (ATIVAN) 0.5 MG tablet, TAKE 1 TABLET DAILY AS NEEDED FOR ANXIETY., Disp: 5 tablet, Rfl: 0    Allergies  Allergen Reactions  . Ceclor [Cefaclor] Hives    Age 36 mild rash    Past Medical History:  Diagnosis Date  . ADHD (attention deficit hyperactivity disorder)   . Anxiety   . Panic attacks   . Vaginal Pap smear, abnormal      Past Surgical History:  Procedure Laterality Date  . DILATION AND CURETTAGE OF UTERUS  01/04/16  . DILATION AND EVACUATION N/A 01/04/2016   Procedure: DILATATION AND EVACUATION;  Surgeon: Louretta Shorten, MD;  Location: [facility];  Service: Gynecology;  Laterality: N/A;  . GUM SURGERY  01/2017  . INDUCED ABORTION    . wisdom teeth removal Bilateral    2010    Review of Systems  Constitutional: Negative for chills, fever and malaise/fatigue.  HENT: Negative for congestion, ear pain and sore throat.   Respiratory: Negative for cough and shortness of  breath.   Cardiovascular: Negative for chest pain.  Gastrointestinal: Positive for abdominal pain, diarrhea, nausea and vomiting. Negative for constipation.  Genitourinary: Negative for dysuria, flank pain, frequency, hematuria and urgency.  Musculoskeletal: Negative for back pain and myalgias.  Skin: Negative for rash.  Neurological: Negative for dizziness and headaches.  Psychiatric/Behavioral: Negative for substance abuse.    Objective:   Vitals: BP 122/78 (BP Location: Right Arm)   Pulse (!) 110   Temp 98.9 F (37.2 C) (Oral)   Resp 18   SpO2 99%   Pulse was 88 on recheck by [employer]  Physical Exam Constitutional:      General: She is not in acute distress.    Appearance: Normal appearance. She is well-developed and normal weight. She is not ill-appearing, toxic-appearing or diaphoretic.  HENT:     Head: Normocephalic and atraumatic.     Right Ear: External ear normal.     Left Ear: External ear normal.     Nose: Nose normal.     Mouth/Throat:     Mouth: Mucous membranes are moist.     Pharynx: Oropharynx is clear.  Eyes:     General: No scleral icterus.    Extraocular Movements: Extraocular movements intact.     Pupils: Pupils are equal, round, and reactive to light.  Cardiovascular:     Rate and Rhythm: Normal rate and regular rhythm.     Heart sounds: Normal heart sounds. No murmur. No friction rub. No gallop.  Pulmonary:     Effort: Pulmonary effort is normal. No respiratory distress.     Breath sounds: Normal breath sounds. No stridor. No wheezing, rhonchi or rales.  Abdominal:     General: Bowel sounds are normal. There is no distension.     Palpations: Abdomen is soft. There is no mass.     Tenderness: There is no abdominal tenderness. There is no right CVA tenderness, left CVA tenderness, guarding or rebound.  Skin:    General: Skin is warm and dry.     Coloration: Skin is not pale.     Findings: No rash.  Neurological:     General: No focal deficit  present.     Mental Status: She is alert and oriented to person, place, and time.  Psychiatric:        Mood and Affect: Mood normal.        Behavior: Behavior normal.        Thought Content: Thought content normal.        Judgment: Judgment normal.    Results for orders placed or performed during the hospital encounter of 07/05/19 (from the past 24 hour(s))  POCT urinalysis dip (device)     Status: Abnormal   Collection Time: 07/05/19  8:28 AM  Result Value Ref Range   Glucose, UA NEGATIVE NEGATIVE mg/dL   Bilirubin Urine NEGATIVE NEGATIVE   Ketones, ur NEGATIVE NEGATIVE mg/dL   Specific Gravity, Urine 1.020 1.005 - 1.030   Hgb urine dipstick TRACE (A) NEGATIVE   pH 7.0 5.0 - 8.0   Protein, ur NEGATIVE NEGATIVE mg/dL   Urobilinogen, UA 0.2 0.0 - 1.0 mg/dL   Nitrite NEGATIVE NEGATIVE   Leukocytes,Ua SMALL (A) NEGATIVE  Pregnancy, urine POC     Status: None   Collection Time: 07/05/19  8:33 AM  Result Value Ref Range   Preg Test, Ur NEGATIVE NEGATIVE    Assessment and Plan :   1. Viral gastroenteritis   2. Nausea vomiting and diarrhea   3. Generalized abdominal pain     We will manage for gastroenteritis with supportive care.  Recommended patient hydrate well, eat light meals and maintain electrolytes.  Will use Zofran and Imodium for nausea, vomiting and diarrhea. Counseled patient on potential for adverse effects with medications prescribed/recommended today, ER and return-to-clinic precautions discussed, patient verbalized understanding.    Wallis Bamberg, [place] 07/05/19 [id number]

## 2019-07-06 ENCOUNTER — Ambulatory Visit: Payer: BC Managed Care – PPO | Admitting: Clinical

## 2019-07-08 LAB — NOVEL CORONAVIRUS, NAA (HOSP ORDER, SEND-OUT TO REF LAB; TAT 18-24 HRS): SARS-CoV-2, NAA: NOT DETECTED

## 2019-07-13 ENCOUNTER — Ambulatory Visit (INDEPENDENT_AMBULATORY_CARE_PROVIDER_SITE_OTHER): Payer: BC Managed Care – PPO | Admitting: Clinical

## 2019-07-13 DIAGNOSIS — F4323 Adjustment disorder with mixed anxiety and depressed mood: Secondary | ICD-10-CM

## 2019-07-27 ENCOUNTER — Other Ambulatory Visit: Payer: Self-pay | Admitting: Family Medicine

## 2019-07-27 DIAGNOSIS — F902 Attention-deficit hyperactivity disorder, combined type: Secondary | ICD-10-CM

## 2019-07-27 NOTE — Telephone Encounter (Signed)
Requested medication (s) are due for refill today: yes  Requested medication (s) are on the active medication list: yes    Last refill: 06/21/2019  #60 0 refills  Future visit scheduled no  Notes to clinic:not delegated  Requested Prescriptions  Pending Prescriptions Disp Refills   amphetamine-dextroamphetamine (ADDERALL) 20 MG tablet [Pharmacy Med Name: DEXTROAMP-AMPHETAMIN 20 MG TAB] 60 tablet 0    Sig: TAKE 1 TABLET BY MOUTH TWICE DAILY.     Not Delegated - Psychiatry:  Stimulants/ADHD Failed - 07/27/2019  6:12 PM      Failed - This refill cannot be delegated      Failed - Urine Drug Screen completed in last 360 days.      Passed - Valid encounter within last 3 months    Recent Outpatient Visits          2 months ago Screening for tuberculosis   [facility], Lilia Argue, MD   2 months ago Cervical cancer screening   [facility], Lilia Argue, MD   6 months ago Attention deficit hyperactivity disorder (ADHD), combined type   Primary Care at [facility], Arlie Solomons, MD   11 months ago Attention deficit hyperactivity disorder (ADHD), combined type   Primary Care at [facility], Arlie Solomons, MD   1 year ago Injury of coccyx, initial encounter   Primary Care at [facility], [place] D, PA-C

## 2019-07-30 DIAGNOSIS — H5789 Other specified disorders of eye and adnexa: Secondary | ICD-10-CM | POA: Diagnosis not present

## 2019-08-02 ENCOUNTER — Other Ambulatory Visit: Payer: Self-pay | Admitting: Family Medicine

## 2019-08-02 DIAGNOSIS — J019 Acute sinusitis, unspecified: Secondary | ICD-10-CM | POA: Diagnosis not present

## 2019-08-02 DIAGNOSIS — F902 Attention-deficit hyperactivity disorder, combined type: Secondary | ICD-10-CM

## 2019-08-02 NOTE — Telephone Encounter (Signed)
This is not a delegated medication.  Sending to providers office

## 2019-08-02 NOTE — Telephone Encounter (Signed)
Medication Refill - Medication: amphetamine-dextroamphetamine (ADDERALL) 20 MG tablet [[id number]]    Preferred Pharmacy (with phone number or street name):  [place], [address] [place] [address]  Phone: [phone] Fax: [phone]     Agent: Please be advised that RX refills may take up to 3 business days. We ask that you follow-up with your pharmacy.

## 2019-08-08 ENCOUNTER — Telehealth: Payer: Self-pay | Admitting: Family Medicine

## 2019-08-08 NOTE — Telephone Encounter (Signed)
amphetamine-dextroamphetamine (ADDERALL) 20 MG tablet [[id number]]   Requesting refill   Please advise pt says she still should have 1 more refill

## 2019-08-09 NOTE — Telephone Encounter (Signed)
Do pt need an OV for a refill this medication.

## 2019-08-14 DIAGNOSIS — B373 Candidiasis of vulva and vagina: Secondary | ICD-10-CM | POA: Diagnosis not present

## 2019-08-16 ENCOUNTER — Other Ambulatory Visit: Payer: Self-pay | Admitting: Family Medicine

## 2019-08-16 DIAGNOSIS — F902 Attention-deficit hyperactivity disorder, combined type: Secondary | ICD-10-CM

## 2019-08-16 NOTE — Telephone Encounter (Signed)
Requested medication (s) are due for refill today: yes  Requested medication (s) are on the active medication list: yes  Last refill:  07/05/2019  Future visit scheduled: no  Notes to clinic: I know that this has been refused but it will not let me refuse it so I have to send it to you. Attempted to contact patient with no answer   Requested Prescriptions  Pending Prescriptions Disp Refills   amphetamine-dextroamphetamine (ADDERALL) 20 MG tablet [Pharmacy Med Name: DEXTROAMP-AMPHETAMIN 20 MG TAB] 60 tablet 0    Sig: TAKE 1 TABLET BY MOUTH TWICE DAILY.     Not Delegated - Psychiatry:  Stimulants/ADHD Failed - 08/16/2019 12:28 PM      Failed - This refill cannot be delegated      Failed - Urine Drug Screen completed in last 360 days.      Failed - Valid encounter within last 3 months    Recent Outpatient Visits          2 months ago Screening for tuberculosis   [facility], Lilia Argue, MD   3 months ago Cervical cancer screening   [facility], Lilia Argue, MD   7 months ago Attention deficit hyperactivity disorder (ADHD), combined type   Primary Care at [facility], Arlie Solomons, MD   1 year ago Attention deficit hyperactivity disorder (ADHD), combined type   Primary Care at [facility], Arlie Solomons, MD   1 year ago Injury of coccyx, initial encounter   Primary Care at [facility], [place] D, PA-C

## 2019-08-18 ENCOUNTER — Other Ambulatory Visit: Payer: Self-pay | Admitting: Family Medicine

## 2019-08-18 DIAGNOSIS — F902 Attention-deficit hyperactivity disorder, combined type: Secondary | ICD-10-CM

## 2019-08-18 NOTE — Telephone Encounter (Signed)
Refill request for DEXTROAMP-AMPHETAMIN 20 MG TAB. Pt needs an appointment.

## 2019-08-20 DIAGNOSIS — R1111 Vomiting without nausea: Secondary | ICD-10-CM | POA: Diagnosis not present

## 2019-08-22 NOTE — Telephone Encounter (Signed)
Pt requesting medication refill  amphetamine-dextroamphetamine (ADDERALL) 20 MG tablet [[id number]]    Please advise

## 2019-08-23 ENCOUNTER — Telehealth: Payer: Self-pay

## 2019-08-23 NOTE — Telephone Encounter (Signed)
Pt. Called requesting a refill of adderall, pt informed she needed a appt as per message recorded by [facility]. Pt. Asserted she had not been informed of that decision and was deeply distressed. Pt. Made appt for Dec 23rd with Dr. Nolon Rod for refill and asked a refill of Adderall be provided till her appt. Pt. Also requested that Dr. Nolon Rod call her if possible.

## 2019-08-31 ENCOUNTER — Ambulatory Visit: Payer: BC Managed Care – PPO | Admitting: Clinical

## 2019-09-07 ENCOUNTER — Ambulatory Visit: Payer: BC Managed Care – PPO | Admitting: Family Medicine

## 2019-09-08 ENCOUNTER — Encounter: Payer: Self-pay | Admitting: Family Medicine

## 2019-09-20 ENCOUNTER — Ambulatory Visit: Payer: BC Managed Care – PPO | Admitting: Clinical

## 2019-09-28 ENCOUNTER — Ambulatory Visit: Payer: BC Managed Care – PPO | Admitting: Clinical

## 2019-10-04 ENCOUNTER — Ambulatory Visit: Payer: Self-pay | Admitting: Clinical

## 2019-10-04 ENCOUNTER — Ambulatory Visit (INDEPENDENT_AMBULATORY_CARE_PROVIDER_SITE_OTHER): Payer: Self-pay | Admitting: Clinical

## 2019-10-04 DIAGNOSIS — F4323 Adjustment disorder with mixed anxiety and depressed mood: Secondary | ICD-10-CM

## 2019-10-11 ENCOUNTER — Ambulatory Visit: Payer: Self-pay | Admitting: Clinical

## 2019-10-12 ENCOUNTER — Encounter: Payer: Self-pay | Admitting: Family Medicine

## 2019-10-12 ENCOUNTER — Other Ambulatory Visit: Payer: Self-pay

## 2019-10-12 ENCOUNTER — Telehealth (INDEPENDENT_AMBULATORY_CARE_PROVIDER_SITE_OTHER): Payer: BC Managed Care – PPO | Admitting: Family Medicine

## 2019-10-12 DIAGNOSIS — F902 Attention-deficit hyperactivity disorder, combined type: Secondary | ICD-10-CM

## 2019-10-12 DIAGNOSIS — F411 Generalized anxiety disorder: Secondary | ICD-10-CM

## 2019-10-12 NOTE — Progress Notes (Signed)
More anxiety and needs med refill. Intrested in antidepressant

## 2019-10-12 NOTE — Patient Instructions (Signed)
° ° ° °  If you have lab work done today you will be contacted with your lab results within the next 2 weeks.  If you have not heard from us then please contact us. The fastest way to get your results is to register for My Chart. ° ° °IF you received an x-ray today, you will receive an invoice from [facility]. Please contact [facility] at [phone] with questions or concerns regarding your invoice.  ° °IF you received labwork today, you will receive an invoice from [facility]. Please contact [facility] at [phone] with questions or concerns regarding your invoice.  ° °Our billing staff will not be able to assist you with questions regarding bills from these companies. ° °You will be contacted with the lab results as soon as they are available. The fastest way to get your results is to activate your My Chart account. Instructions are located on the last page of this paperwork. If you have not heard from us regarding the results in 2 weeks, please contact this office. °  ° ° ° °

## 2019-10-12 NOTE — Progress Notes (Signed)
Telemedicine Encounter- SOAP NOTE Established Patient  This telephone encounter was conducted with the patient's (or proxy's) verbal consent via audio telecommunications: yes/no: Yes Patient was instructed to have this encounter in a suitably private space; and to only have persons present to whom they give permission to participate. In addition, patient identity was confirmed by use of name plus two identifiers (DOB and address).  I discussed the limitations, risks, security and privacy concerns of performing an evaluation and management service by telephone and the availability of in person appointments. I also discussed with the patient that there may be a patient responsible charge related to this service. The patient expressed understanding and agreed to proceed.  I spent a total of TIME; 0 MIN TO 60 MIN: 15 minutes talking with the patient or their proxy.  No chief complaint on file.   Subjective   [name] [name] is a 29 y.o. established patient. Telephone visit today for  HPI  She states that she is no longer at the methadone clinic She states that she has more anxiety and not feeling like herself  She states that she was prescribed lexapro in the past She took her left over lexapro in the past months but states that she needs her adderall  She states that she would forget things and it is very stressful for her She is trying to coordinate and work and make a huge effort to exercise.   She denies depression.  She reports more anxiety.  Depression screen [facility] 2/9 05/16/2019 01/14/2019 08/06/2018 07/09/2018 03/31/2018  Decreased Interest 0 0 0 0 0  Down, Depressed, Hopeless 0 0 0 0 0  PHQ - 2 Score 0 0 0 0 0   She states that she does not feel like she is performing as well as she could be so it makes her feel sad.   She sees a Social worker.    Patient Active Problem List   Diagnosis Date Noted  . Methadone maintenance therapy patient ([facility]) 05/16/2019  . Insomnia 12/09/2016    . ADHD (attention deficit hyperactivity disorder) 10/27/2014  . GAD (generalized anxiety disorder) 10/27/2014  . Other acne 05/03/2008  . Allergic rhinitis 01/27/2008    Past Medical History:  Diagnosis Date  . ADHD (attention deficit hyperactivity disorder)   . Anxiety   . Panic attacks   . Vaginal Pap smear, abnormal     Current Outpatient Medications  Medication Sig Dispense Refill  . albuterol (PROVENTIL HFA;VENTOLIN HFA) 108 (90 Base) MCG/ACT inhaler Inhale 1-2 puffs into the lungs every 6 (six) hours as needed for wheezing or shortness of breath. 1 Inhaler 11  . amphetamine-dextroamphetamine (ADDERALL) 20 MG tablet TAKE 1 TABLET BY MOUTH TWICE DAILY. 60 tablet 0  . fluticasone (FLONASE) 50 MCG/ACT nasal spray Place 1 spray into both nostrils 2 (two) times daily. 16 g 4  . loperamide (IMODIUM) 2 MG capsule Take 1 capsule (2 mg total) by mouth daily as needed for diarrhea or loose stools. 10 capsule 0  . ondansetron (ZOFRAN-ODT) 8 MG disintegrating tablet Take 1 tablet (8 mg total) by mouth every 8 (eight) hours as needed for nausea or vomiting. 20 tablet 0  . bismuth subsalicylate (PEPTO BISMOL) 262 MG chewable tablet Chew 524 mg by mouth as needed.    . famotidine (PEPCID) 10 MG tablet Take 10 mg by mouth 2 (two) times daily.    Marland Kitchen LORazepam (ATIVAN) 0.5 MG tablet TAKE 1 TABLET DAILY AS NEEDED FOR ANXIETY. (Patient not taking:  Reported on 10/12/2019) 5 tablet 0   No current facility-administered medications for this visit.    Allergies  Allergen Reactions  . Ceclor [Cefaclor] Hives    Age 41 mild rash    Social History   Socioeconomic History  . Marital status: Married    Spouse name: Not on file  . Number of children: Not on file  . Years of education: Not on file  . Highest education level: Not on file  Occupational History  . Not on file  Tobacco Use  . Smoking status: Former Smoker    Types: Cigarettes    Quit date: 08/15/2013    Years since quitting: 6.1  .  Smokeless tobacco: Never Used  Substance and Sexual Activity  . Alcohol use: No    Alcohol/week: 2.0 standard drinks    Types: 2 Standard drinks or equivalent per week    Comment: socially, not with preg  . Drug use: No  . Sexual activity: Not Currently    Birth control/protection: I.U.D.  Other Topics Concern  . Not on file  Social History Narrative  . Not on file   Social Determinants of Health   Financial Resource Strain:   . Difficulty of Paying Living Expenses: Not on file  Food Insecurity:   . Worried About Programme researcher, broadcasting/film/video in the Last Year: Not on file  . Ran Out of Food in the Last Year: Not on file  Transportation Needs:   . Lack of Transportation (Medical): Not on file  . Lack of Transportation (Non-Medical): Not on file  Physical Activity:   . Days of Exercise per Week: Not on file  . Minutes of Exercise per Session: Not on file  Stress:   . Feeling of Stress : Not on file  Social Connections:   . Frequency of Communication with Friends and Family: Not on file  . Frequency of Social Gatherings with Friends and Family: Not on file  . Attends Religious Services: Not on file  . Active Member of Clubs or Organizations: Not on file  . Attends Banker Meetings: Not on file  . Marital Status: Not on file  Intimate Partner Violence:   . Fear of Current or Ex-Partner: Not on file  . Emotionally Abused: Not on file  . Physically Abused: Not on file  . Sexually Abused: Not on file    ROS Review of Systems  Constitutional: Negative for activity change, appetite change, chills and fever.  HENT: Negative for congestion, nosebleeds, trouble swallowing and voice change.   Respiratory: Negative for cough, shortness of breath and wheezing.   Gastrointestinal: Negative for diarrhea, nausea and vomiting.  Genitourinary: Negative for difficulty urinating, dysuria, flank pain and hematuria.  Musculoskeletal: Negative for back pain, joint swelling and neck pain.    Neurological: Negative for dizziness, speech difficulty, light-headedness and numbness.  See HPI. All other review of systems negative.   Objective   Vitals as reported by the patient: There were no vitals filed for this visit.  Unable to do physical on the virtual visit.    Diagnoses and all orders for this visit:  Anxiety state -     Ambulatory referral to Psychiatry  Attention deficit hyperactivity disorder (ADHD), combined type -     Ambulatory referral to Psychiatry   Will advised pt to see Psychiatry for medications This provider would recommend getting a reassessment and medication management  I discussed the assessment and treatment plan with the patient. The patient was provided  an opportunity to ask questions and all were answered. The patient agreed with the plan and demonstrated an understanding of the instructions.   The patient was advised to call back or seek an in-person evaluation if the symptoms worsen or if the condition fails to improve as anticipated.  I provided 15 minutes of non-face-to-face time during this encounter.  Doristine Bosworth, MD  Primary Care at [facility]

## 2019-10-13 ENCOUNTER — Telehealth (INDEPENDENT_AMBULATORY_CARE_PROVIDER_SITE_OTHER): Payer: Self-pay | Admitting: Licensed Clinical Social Worker

## 2019-10-13 DIAGNOSIS — F411 Generalized anxiety disorder: Secondary | ICD-10-CM

## 2019-10-13 NOTE — Telephone Encounter (Signed)
Left message encouraging contact.  Will call back on 10/04/2019

## 2019-10-17 ENCOUNTER — Telehealth: Payer: Self-pay | Admitting: Family Medicine

## 2019-10-17 NOTE — Telephone Encounter (Signed)
Can the 10/12/19 encounter be signed so that I may send referral and notes? Thanks

## 2019-10-17 NOTE — Telephone Encounter (Signed)
Please advise 

## 2019-10-18 ENCOUNTER — Ambulatory Visit (INDEPENDENT_AMBULATORY_CARE_PROVIDER_SITE_OTHER): Payer: Self-pay | Admitting: Clinical

## 2019-10-18 DIAGNOSIS — F4323 Adjustment disorder with mixed anxiety and depressed mood: Secondary | ICD-10-CM

## 2019-10-25 ENCOUNTER — Ambulatory Visit: Payer: Self-pay | Admitting: Clinical

## 2019-11-01 ENCOUNTER — Ambulatory Visit: Payer: Self-pay | Admitting: Clinical

## 2019-11-19 ENCOUNTER — Ambulatory Visit: Payer: Self-pay | Attending: Internal Medicine

## 2019-11-19 DIAGNOSIS — Z23 Encounter for immunization: Secondary | ICD-10-CM | POA: Insufficient documentation

## 2019-11-19 NOTE — Progress Notes (Signed)
   Covid-19 Vaccination Clinic  Name:  THENA DEVORA    MRN: [id number] DOB: 1991-08-30  11/19/2019  Ms. Flessner was observed post Covid-19 immunization for 15 minutes without incident. She was provided with Vaccine Information Sheet and instruction to access the V-Safe system.   Ms. Nazareno was instructed to call 911 with any severe reactions post vaccine: Marland Kitchen Difficulty breathing  . Swelling of face and throat  . A fast heartbeat  . A bad rash all over body  . Dizziness and weakness   Immunizations Administered    Name Date Dose VIS Date Route   Pfizer COVID-19 Vaccine 11/19/2019  5:36 PM 0.3 mL 08/26/2019 Intramuscular   Manufacturer: [employer], [employer]   Lot: [id number]   NDC: [address]-7

## 2019-12-07 ENCOUNTER — Ambulatory Visit (INDEPENDENT_AMBULATORY_CARE_PROVIDER_SITE_OTHER): Payer: BC Managed Care – PPO | Admitting: Adult Health

## 2019-12-07 ENCOUNTER — Other Ambulatory Visit: Payer: Self-pay

## 2019-12-07 ENCOUNTER — Encounter: Payer: Self-pay | Admitting: Adult Health

## 2019-12-07 VITALS — BP 85/57 | HR 54 | Ht 67.0 in | Wt <= 1120 oz

## 2019-12-07 DIAGNOSIS — F909 Attention-deficit hyperactivity disorder, unspecified type: Secondary | ICD-10-CM | POA: Diagnosis not present

## 2019-12-07 DIAGNOSIS — F411 Generalized anxiety disorder: Secondary | ICD-10-CM

## 2019-12-07 DIAGNOSIS — F331 Major depressive disorder, recurrent, moderate: Secondary | ICD-10-CM | POA: Diagnosis not present

## 2019-12-07 DIAGNOSIS — F429 Obsessive-compulsive disorder, unspecified: Secondary | ICD-10-CM

## 2019-12-07 MED ORDER — FLUOXETINE HCL 10 MG PO CAPS: ORAL_CAPSULE | ORAL | 2 refills | Status: DC

## 2019-12-07 MED ORDER — AMPHETAMINE-DEXTROAMPHETAMINE 20 MG PO TABS: 20.0000 mg | ORAL_TABLET | Freq: Two times a day (BID) | ORAL | 0 refills | Status: DC

## 2019-12-07 NOTE — Progress Notes (Signed)
[facility] MD/PA/NP Initial Note  12/07/2019 9:11 AM [name] [name]  MRN:  [id number]  Chief Complaint:   HPI:   Describes mood today as "so-so". Pleasant. Communicative. Tearful. Mood symptoms - reports depression, anxiety, and irritability. More "depressed" overall. History of worry and rumination - "I get stuck on things and can't let them go". . Denies rituals or routines. Has been off of Adderall for over 5 months. Was taking 20mg  BID most recently. Had been seeing a provider at [place]. Was having a difficult time getting an appointment Separated from husband for 5 months. Trying to get a job and take care of her family. Stating "I need a job and get things back in order. 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 Stating "I want to feel like I used to feel again". Has children "all the time" - husband visiting once a week. Has taken the children to school in the morning.  Stable interest and motivation. Taking medications as prescribed.  Energy levels lower. Active, trying to exercise when she can. Enjoys some usual interests and activities. Married, but separated for past 5 months. Married x 5 years. Has two children 5 and 3. Has family local - "they are a crappy family". Mother and father with substance issues. Aunt and uncle supportive. Has supportive friends.  Appetite adequate. Weight gain. Sleeps well most nights. Averages 10 to 11hours. Focus and concentration difficulties - diagnosed with ADHD in childhood. Retested at [facility]. Completing tasks. Managing some aspects of household - unable to keep up with things around the house. Not working currently. College graduate - [employer]. Denies SI or HI. Denies AH or VH. Denies impulsivity, substance issues, increased spending. Racing thoughts off of medication.    Previous medication trials: Adderall, Clonazepam, Lorazepam, Trazadone, Lexapro, Paxil, Zoloft.  Visit Diagnosis:    ICD-10-CM   1. Major depressive  disorder, recurrent episode, moderate (HCC)  F33.1   2. Attention deficit hyperactivity disorder (ADHD), unspecified ADHD type  F90.9   3. Generalized anxiety disorder  F41.1     Past Psychiatric History: Denies psychiatric hospitalization.  Past Medical History:  Past Medical History:  Diagnosis Date  . ADHD (attention deficit hyperactivity disorder)   . Anxiety   . Panic attacks   . Vaginal Pap smear, abnormal     Past Surgical History:  Procedure Laterality Date  . DILATION AND CURETTAGE OF UTERUS  01/04/16  . DILATION AND EVACUATION N/A 01/04/2016   Procedure: DILATATION AND EVACUATION;  Surgeon: Louretta Shorten, MD;  Location: [place];  Service: Gynecology;  Laterality: N/A;  . GUM SURGERY  01/2017  . INDUCED ABORTION    . wisdom teeth removal Bilateral    2010    Family Psychiatric History: 2 sister and a brother with anxiety, ADHD. Mother with ADHD and anxiety.  Family History:  Family History  Problem Relation Age of Onset  . Hypertension Mother   . Heart disease Father   . Cancer Father        testicular  . Hypertension Father   . Hyperlipidemia Father   . Heart disease Paternal Grandfather     Social History:  Social History   Socioeconomic History  . Marital status: Married    Spouse name: Not on file  . Number of children: Not on file  . Years of education: Not on file  . Highest education level: Not on file  Occupational History  . Not on file  Tobacco Use  .  Smoking status: Former Smoker    Types: Cigarettes    Quit date: 08/15/2013    Years since quitting: 6.3  . Smokeless tobacco: Never Used  Substance and Sexual Activity  . Alcohol use: No    Alcohol/week: 2.0 standard drinks    Types: 2 Standard drinks or equivalent per week    Comment: socially, not with preg  . Drug use: No  . Sexual activity: Not Currently    Birth control/protection: I.U.D.  Other Topics Concern  . Not on file  Social History Narrative  . Not on file   Social  Determinants of Health   Financial Resource Strain:   . Difficulty of Paying Living Expenses:   Food Insecurity:   . Worried About Programme researcher, broadcasting/film/video in the Last Year:   . Barista in the Last Year:   Transportation Needs:   . Freight forwarder (Medical):   Marland Kitchen Lack of Transportation (Non-Medical):   Physical Activity:   . Days of Exercise per Week:   . Minutes of Exercise per Session:   Stress:   . Feeling of Stress :   Social Connections:   . Frequency of Communication with Friends and Family:   . Frequency of Social Gatherings with Friends and Family:   . Attends Religious Services:   . Active Member of Clubs or Organizations:   . Attends Banker Meetings:   Marland Kitchen Marital Status:     Allergies:  Allergies  Allergen Reactions  . Ceclor [Cefaclor] Hives    Age 57 mild rash    Metabolic Disorder Labs: No results found for: HGBA1C, MPG No results found for: PROLACTIN Lab Results  Component Value Date   CHOL 110 06/29/2017   TRIG 55 06/29/2017   HDL 38 (L) 06/29/2017   CHOLHDL 2.9 06/29/2017   LDLCALC 61 06/29/2017   Lab Results  Component Value Date   TSH 4.150 05/26/2019    Therapeutic Level Labs: No results found for: LITHIUM No results found for: VALPROATE No components found for:  CBMZ  Current Medications: Current Outpatient Medications  Medication Sig Dispense Refill  . albuterol (PROVENTIL HFA;VENTOLIN HFA) 108 (90 Base) MCG/ACT inhaler Inhale 1-2 puffs into the lungs every 6 (six) hours as needed for wheezing or shortness of breath. 1 Inhaler 11  . fluticasone (FLONASE) 50 MCG/ACT nasal spray Place 1 spray into both nostrils 2 (two) times daily. 16 g 4   No current facility-administered medications for this visit.    Medication Side Effects: none  Orders placed this visit:  No orders of the defined types were placed in this encounter.   Psychiatric Specialty Exam:  Review of Systems  Musculoskeletal: Negative for gait  problem.  Neurological: Negative for tremors.  Psychiatric/Behavioral:       Please refer to HPI    Blood pressure (!) 85/57, pulse (!) 54, height 5\' 7"  (1.702 m), weight 20 lb (9.072 kg).Body mass index is 3.13 kg/m.  General Appearance: Neat and Well Groomed  Eye Contact:  Good  Speech:  Clear and Coherent and Normal Rate  Volume:  Normal  Mood:  Anxious, Depressed and Irritable  Affect:  Appropriate and Congruent  Thought Process:  Coherent and Descriptions of Associations: Intact  Orientation:  Full (Time, Place, and Person)  Thought Content: Logical   Suicidal Thoughts:  No  Homicidal Thoughts:  No  Memory:  WNL  Judgement:  Good  Insight:  Good  Psychomotor Activity:  NA  Concentration:  Concentration: Good  Recall:  Good  Fund of Knowledge: Good  Language: Good  Assets:  Communication Skills Desire for Improvement Financial Resources/Insurance Housing Intimacy Leisure Time Physical Health Resilience Social Support Talents/Skills Transportation Vocational/Educational  ADL's:  Intact  Cognition: WNL  Prognosis:  Good   Screenings:  PHQ2-9     Office Visit from 05/16/2019 in Primary Care at [place] [facility] from 01/14/2019 in Primary Care at [facility] Visit from 08/06/2018 in [facility] Care at [facility] Visit from 07/09/2018 [facility] Visit from 03/31/2018 in Primary Care at [place]  PHQ-2 Total Score  0  0  0  0  0      Receiving Psychotherapy: Yes   Treatment Plan/Recommendations:   Plan:  PDMP reviewed  1. Add Adderall 20mg  BID 2. Add Prozac 10mg  daily x 7 days, then 20mg  daily  RTC 4 weeks  Patient advised to contact office with any questions, adverse effects, or acute worsening in signs and symptoms.  Discussed potential metabolic side effects associated with atypical antipsychotics, as well as potential risk for movement side effects. Advised pt to contact office if movement side effects occur.    , NP

## 2019-12-10 ENCOUNTER — Ambulatory Visit: Payer: Self-pay | Attending: Internal Medicine

## 2019-12-10 DIAGNOSIS — Z23 Encounter for immunization: Secondary | ICD-10-CM

## 2019-12-20 ENCOUNTER — Ambulatory Visit: Payer: Self-pay

## 2020-01-03 ENCOUNTER — Encounter: Payer: Self-pay | Admitting: Registered Nurse

## 2020-01-03 ENCOUNTER — Other Ambulatory Visit: Payer: Self-pay

## 2020-01-03 ENCOUNTER — Telehealth (INDEPENDENT_AMBULATORY_CARE_PROVIDER_SITE_OTHER): Payer: BC Managed Care – PPO | Admitting: Registered Nurse

## 2020-01-03 DIAGNOSIS — R0981 Nasal congestion: Secondary | ICD-10-CM

## 2020-01-03 DIAGNOSIS — R0989 Other specified symptoms and signs involving the circulatory and respiratory systems: Secondary | ICD-10-CM

## 2020-01-03 DIAGNOSIS — J9801 Acute bronchospasm: Secondary | ICD-10-CM | POA: Diagnosis not present

## 2020-01-03 MED ORDER — AZITHROMYCIN 250 MG PO TABS: ORAL_TABLET | ORAL | 0 refills | Status: DC

## 2020-01-03 MED ORDER — MONTELUKAST SODIUM 10 MG PO TABS: 10.0000 mg | ORAL_TABLET | Freq: Every day | ORAL | 3 refills | Status: DC

## 2020-01-03 MED ORDER — FLUTICASONE PROPIONATE 50 MCG/ACT NA SUSP: 1.0000 | Freq: Two times a day (BID) | NASAL | 4 refills | Status: DC

## 2020-01-03 MED ORDER — PREDNISONE 10 MG (21) PO TBPK: ORAL_TABLET | ORAL | 0 refills | Status: DC

## 2020-01-03 MED ORDER — ALBUTEROL SULFATE HFA 108 (90 BASE) MCG/ACT IN AERS: 1.0000 | INHALATION_SPRAY | Freq: Four times a day (QID) | RESPIRATORY_TRACT | 3 refills | Status: DC | PRN

## 2020-01-03 MED ORDER — GUAIFENESIN-DM 100-10 MG/5ML PO SYRP: 5.0000 mL | ORAL_SOLUTION | ORAL | 0 refills | Status: DC | PRN

## 2020-01-03 NOTE — Progress Notes (Signed)
Telemedicine Encounter- SOAP NOTE Established Patient  This telephone encounter was conducted with the patient's (or proxy's) verbal consent via audio telecommunications: yes  Patient was instructed to have this encounter in a suitably private space; and to only have persons present to whom they give permission to participate. In addition, patient identity was confirmed by use of name plus two identifiers (DOB and address).  I discussed the limitations, risks, security and privacy concerns of performing an evaluation and management service by telephone and the availability of in person appointments. I also discussed with the patient that there may be a patient responsible charge related to this service. The patient expressed understanding and agreed to proceed.  I spent a total of 12 minutes talking with the patient or their proxy.  Chief Complaint  Patient presents with  . Cough    patient states she had a cough for a week and a half. she is now feeling worse cough , congestion , cold chills , and had a fever. Per patient she has tried all kinds of OTC medication and it is not getting better,    Subjective   [name] [name] is a 29 y.o. established patient. Telephone visit today for cough and congestion  Symptoms onset around 10 days ago, gradually worsening Cough, chest congestion, some mucus produced. Chills, fever, fatigue. Easily short of breath when doing things. Some upper right chest discomfort. No nvd, chest pain, palpitations, loc, dizziness.  Has had hx of pna - feels similar  rec'd second covid injection around 6 weeks ago  No sick contacts  HPI   Patient Active Problem List   Diagnosis Date Noted  . Methadone maintenance therapy patient ([place]) 05/16/2019  . Insomnia 12/09/2016  . ADHD (attention deficit hyperactivity disorder) 10/27/2014  . GAD (generalized anxiety disorder) 10/27/2014  . Other acne 05/03/2008  . Allergic rhinitis 01/27/2008    Past Medical  History:  Diagnosis Date  . ADHD (attention deficit hyperactivity disorder)   . Anxiety   . Panic attacks   . Vaginal Pap smear, abnormal     Current Outpatient Medications  Medication Sig Dispense Refill  . albuterol (VENTOLIN HFA) 108 (90 Base) MCG/ACT inhaler Inhale 1-2 puffs into the lungs every 6 (six) hours as needed for wheezing or shortness of breath. 18 g 3  . amphetamine-dextroamphetamine (ADDERALL) 20 MG tablet Take 1 tablet (20 mg total) by mouth 2 (two) times daily. 60 tablet 0  . FLUoxetine (PROZAC) 10 MG capsule Take one capsule daily x 7 days, then take two capsules daily. 60 capsule 2  . fluticasone (FLONASE) 50 MCG/ACT nasal spray Place 1 spray into both nostrils 2 (two) times daily. 16 g 4  . azithromycin (ZITHROMAX) 250 MG tablet Take 2 tabs on first day. Then take 1 tab daily. Finish entire supply. 6 tablet 0  . guaiFENesin-dextromethorphan (ROBITUSSIN DM) 100-10 MG/5ML syrup Take 5 mLs by mouth every 4 (four) hours as needed for cough. 118 mL 0  . montelukast (SINGULAIR) 10 MG tablet Take 1 tablet (10 mg total) by mouth at bedtime. 30 tablet 3  . predniSONE (STERAPRED UNI-PAK 21 TAB) 10 MG (21) TBPK tablet Take per blister pack instructions. Do not skip doses. Finish entire pack. 1 each 0   No current facility-administered medications for this visit.    Allergies  Allergen Reactions  . Ceclor [Cefaclor] Hives    Age 29 mild rash    Social History   Socioeconomic History  . Marital status: Married  Spouse name: Not on file  . Number of children: Not on file  . Years of education: Not on file  . Highest education level: Not on file  Occupational History  . Not on file  Tobacco Use  . Smoking status: Former Smoker    Types: Cigarettes    Quit date: 08/15/2013    Years since quitting: 6.3  . Smokeless tobacco: Never Used  Substance and Sexual Activity  . Alcohol use: No    Alcohol/week: 2.0 standard drinks    Types: 2 Standard drinks or equivalent per  week    Comment: socially, not with preg  . Drug use: No  . Sexual activity: Not Currently    Birth control/protection: I.U.D.  Other Topics Concern  . Not on file  Social History Narrative  . Not on file   Social Determinants of Health   Financial Resource Strain:   . Difficulty of Paying Living Expenses:   Food Insecurity:   . Worried About Programme researcher, broadcasting/film/video in the Last Year:   . Barista in the Last Year:   Transportation Needs:   . Freight forwarder (Medical):   Marland Kitchen Lack of Transportation (Non-Medical):   Physical Activity:   . Days of Exercise per Week:   . Minutes of Exercise per Session:   Stress:   . Feeling of Stress :   Social Connections:   . Frequency of Communication with Friends and Family:   . Frequency of Social Gatherings with Friends and Family:   . Attends Religious Services:   . Active Member of Clubs or Organizations:   . Attends Banker Meetings:   Marland Kitchen Marital Status:   Intimate Partner Violence:   . Fear of Current or Ex-Partner:   . Emotionally Abused:   Marland Kitchen Physically Abused:   . Sexually Abused:     ROS Per hpi. All others negative on 10 pt ROS  Objective   Vitals as reported by the patient: There were no vitals filed for this visit.  [name] was seen today for cough.  Diagnoses and all orders for this visit:  Nasal congestion -     fluticasone (FLONASE) 50 MCG/ACT nasal spray; Place 1 spray into both nostrils 2 (two) times daily.  Bronchospasm -     albuterol (VENTOLIN HFA) 108 (90 Base) MCG/ACT inhaler; Inhale 1-2 puffs into the lungs every 6 (six) hours as needed for wheezing or shortness of breath.  Chest congestion -     azithromycin (ZITHROMAX) 250 MG tablet; Take 2 tabs on first day. Then take 1 tab daily. Finish entire supply. -     guaiFENesin-dextromethorphan (ROBITUSSIN DM) 100-10 MG/5ML syrup; Take 5 mLs by mouth every 4 (four) hours as needed for cough. -     montelukast (SINGULAIR) 10 MG tablet;  Take 1 tablet (10 mg total) by mouth at bedtime. -     predniSONE (STERAPRED UNI-PAK 21 TAB) 10 MG (21) TBPK tablet; Take per blister pack instructions. Do not skip doses. Finish entire pack.   PLAN  Lower respiratory infection : z pack  Supportive care: prednisone taper, montelukast, albuterol, fluticasone, guauifenesin-dextromethorphan.  Continue supportive non pharm as well, stay at home until feeling better  covid testing strongly advised  Patient encouraged to call clinic with any questions, comments, or concerns.   I discussed the assessment and treatment plan with the patient. The patient was provided an opportunity to ask questions and all were answered. The patient agreed with the  plan and demonstrated an understanding of the instructions.   The patient was advised to call back or seek an in-person evaluation if the symptoms worsen or if the condition fails to improve as anticipated.  I provided 11 minutes of non-face-to-face time during this encounter.  Janeece Agee, NP  Primary Care at [facility]

## 2020-01-03 NOTE — Patient Instructions (Signed)
° ° ° °  If you have lab work done today you will be contacted with your lab results within the next 2 weeks.  If you have not heard from us then please contact us. The fastest way to get your results is to register for My Chart. ° ° °IF you received an x-ray today, you will receive an invoice from [facility]. Please contact [facility] at [phone] with questions or concerns regarding your invoice.  ° °IF you received labwork today, you will receive an invoice from [facility]. Please contact [facility] at [phone] with questions or concerns regarding your invoice.  ° °Our billing staff will not be able to assist you with questions regarding bills from these companies. ° °You will be contacted with the lab results as soon as they are available. The fastest way to get your results is to activate your My Chart account. Instructions are located on the last page of this paperwork. If you have not heard from us regarding the results in 2 weeks, please contact this office. °  ° ° ° °

## 2020-01-04 ENCOUNTER — Ambulatory Visit (INDEPENDENT_AMBULATORY_CARE_PROVIDER_SITE_OTHER): Payer: BC Managed Care – PPO | Admitting: Adult Health

## 2020-01-04 ENCOUNTER — Encounter: Payer: Self-pay | Admitting: Adult Health

## 2020-01-04 DIAGNOSIS — F909 Attention-deficit hyperactivity disorder, unspecified type: Secondary | ICD-10-CM | POA: Diagnosis not present

## 2020-01-04 DIAGNOSIS — F429 Obsessive-compulsive disorder, unspecified: Secondary | ICD-10-CM

## 2020-01-04 DIAGNOSIS — F331 Major depressive disorder, recurrent, moderate: Secondary | ICD-10-CM

## 2020-01-04 DIAGNOSIS — F411 Generalized anxiety disorder: Secondary | ICD-10-CM

## 2020-01-04 MED ORDER — AMPHETAMINE-DEXTROAMPHETAMINE 20 MG PO TABS: 20.0000 mg | ORAL_TABLET | Freq: Two times a day (BID) | ORAL | 0 refills | Status: DC

## 2020-01-04 NOTE — Progress Notes (Signed)
[name] [name] [id number] [birth year] 29 y.o.  Subjective:   Patient ID:  [name] [name] is a 29 y.o. (DOB Apr 26, 1991) female.  Chief Complaint: No chief complaint on file.   HPI   [name] [name] presents to the office today for follow-up of MDD, GAD, OCD, and ADHD.  Describes mood today as "ok". Pleasant. Communicative. Decreased tearfulness. Mood symptoms - reports decreased depression, anxiety, and irritability. Stating "the depression is dissipating". Not as worried. Making a list and getting things done. Decreased checking and rituals. Stating "I feel 60% better than I did". Went on a trip for a "girl's" weekend. Stating "I feel like myself again". Laughing again. Interviewing for a job - has passed the [employer] exam. Remains separated from husband - 6 months. Stable interest and motivation. Taking medications as prescribed.  Energy levels improved. Active, trying to exercise when she can. Enjoys some usual interests and activities. Married x 5 years - separated for past 6 months. Has two children 5 and 3. Has  family local. Mother and father with substance issues. Aunt and uncle supportive. Has supportive friends.  Appetite adequate. Weight stable - 200 pounds. Considering a weight loss plan.  Sleeps well most nights. Averages 8 to 9 hours. Focus and concentration improved with restarting Adderall. Completing tasks. Managing some aspects of household. Not working currently. 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 Denies SI or HI. Denies AH or VH. Denies impulsivity, substance issues, increased spending. Racing thoughts off of medication.    Previous medication trials: Adderall, Clonazepam, Lorazepam, Trazadone, Lexapro, Paxil, Zoloft.   PHQ2-9     Video Visit from 01/03/2020 in Primary Care at [facility] Visit from 05/16/2019 in Primary Care at [facility] Telemedicine from 01/14/2019 in Primary Care at [facility] Visit from 08/06/2018 in Primary Care at [facility] Visit  from 07/09/2018 in Primary Care at [facility] Total Score  0  0  0  0  0       Review of Systems:  Review of Systems  Musculoskeletal: Negative for gait problem.  Neurological: Negative for tremors.  Psychiatric/Behavioral:       Please refer to HPI    Medications: I have reviewed the patient's current medications.  Current Outpatient Medications  Medication Sig Dispense Refill  . albuterol (VENTOLIN HFA) 108 (90 Base) MCG/ACT inhaler Inhale 1-2 puffs into the lungs every 6 (six) hours as needed for wheezing or shortness of breath. 18 g 3  . amphetamine-dextroamphetamine (ADDERALL) 20 MG tablet Take 1 tablet (20 mg total) by mouth 2 (two) times daily. 60 tablet 0  . azithromycin (ZITHROMAX) 250 MG tablet Take 2 tabs on first day. Then take 1 tab daily. Finish entire supply. 6 tablet 0  . FLUoxetine (PROZAC) 10 MG capsule Take one capsule daily x 7 days, then take two capsules daily. 60 capsule 2  . fluticasone (FLONASE) 50 MCG/ACT nasal spray Place 1 spray into both nostrils 2 (two) times daily. 16 g 4  . guaiFENesin-dextromethorphan (ROBITUSSIN DM) 100-10 MG/5ML syrup Take 5 mLs by mouth every 4 (four) hours as needed for cough. 118 mL 0  . montelukast (SINGULAIR) 10 MG tablet Take 1 tablet (10 mg total) by mouth at bedtime. 30 tablet 3  . predniSONE (STERAPRED UNI-PAK 21 TAB) 10 MG (21) TBPK tablet Take per blister pack instructions. Do not skip doses. Finish entire pack. 1 each 0   No current facility-administered medications for this visit.    Medication Side Effects: None  Allergies:  Allergies  Allergen Reactions  . Ceclor [Cefaclor] Hives    Age 29 mild rash    Past Medical History:  Diagnosis Date  . ADHD (attention deficit hyperactivity disorder)   . Anxiety   . Panic attacks   . Vaginal Pap smear, abnormal     Family History  Problem Relation Age of Onset  . Hypertension Mother   . Heart disease Father   . Cancer Father        testicular  . Hypertension  Father   . Hyperlipidemia Father   . Heart disease Paternal Grandfather     Social History   Socioeconomic History  . Marital status: Married    Spouse name: Not on file  . Number of children: Not on file  . Years of education: Not on file  . Highest education level: Not on file  Occupational History  . Not on file  Tobacco Use  . Smoking status: Former Smoker    Types: Cigarettes    Quit date: 08/15/2013    Years since quitting: 6.3  . Smokeless tobacco: Never Used  Substance and Sexual Activity  . Alcohol use: No    Alcohol/week: 2.0 standard drinks    Types: 2 Standard drinks or equivalent per week    Comment: socially, not with preg  . Drug use: No  . Sexual activity: Not Currently    Birth control/protection: I.U.D.  Other Topics Concern  . Not on file  Social History Narrative  . Not on file   Social Determinants of Health   Financial Resource Strain:   . Difficulty of Paying Living Expenses:   Food Insecurity:   . Worried About Charity fundraiser in the Last Year:   . Arboriculturist in the Last Year:   Transportation Needs:   . Film/video editor (Medical):   Marland Kitchen Lack of Transportation (Non-Medical):   Physical Activity:   . Days of Exercise per Week:   . Minutes of Exercise per Session:   Stress:   . Feeling of Stress :   Social Connections:   . Frequency of Communication with Friends and Family:   . Frequency of Social Gatherings with Friends and Family:   . Attends Religious Services:   . Active Member of Clubs or Organizations:   . Attends Archivist Meetings:   Marland Kitchen Marital Status:   Intimate Partner Violence:   . Fear of Current or Ex-Partner:   . Emotionally Abused:   Marland Kitchen Physically Abused:   . Sexually Abused:     Past Medical History, Surgical history, Social history, and Family history were reviewed and updated as appropriate.   Please see review of systems for further details on the patient's review from today.   Objective:    Physical Exam:  There were no vitals taken for this visit.  Physical Exam Constitutional:      General: She is not in acute distress. Musculoskeletal:        General: No deformity.  Neurological:     Mental Status: She is alert and oriented to person, place, and time.     Coordination: Coordination normal.  Psychiatric:        Attention and Perception: Attention and perception normal. She does not perceive auditory or visual hallucinations.        Mood and Affect: Mood is anxious and depressed. Affect is not labile, blunt, angry or inappropriate.        Speech: Speech normal.  Behavior: Behavior normal.        Thought Content: Thought content normal. Thought content is not paranoid or delusional. Thought content does not include homicidal or suicidal ideation. Thought content does not include homicidal or suicidal plan.        Cognition and Memory: Cognition and memory normal.        Judgment: Judgment normal.     Comments: Insight intact     Lab Review:     Component Value Date/Time   NA 141 06/29/2017 1224   K 4.4 06/29/2017 1224   CL 103 06/29/2017 1224   CO2 25 06/29/2017 1224   GLUCOSE 80 06/29/2017 1224   GLUCOSE 89 10/24/2015 1235   BUN 8 06/29/2017 1224   CREATININE 0.72 06/29/2017 1224   CALCIUM 9.5 06/29/2017 1224   PROT 7.1 06/29/2017 1224   ALBUMIN 4.5 06/29/2017 1224   AST 16 06/29/2017 1224   ALT 13 06/29/2017 1224   ALKPHOS 81 06/29/2017 1224   BILITOT 0.8 06/29/2017 1224   GFRNONAA 115 06/29/2017 1224   GFRAA 133 06/29/2017 1224       Component Value Date/Time   WBC 16.0 (A) 02/16/2018 1224   WBC 7.1 01/04/2016 1330   RBC 4.05 02/16/2018 1224   RBC 4.22 01/04/2016 1330   HGB 12.5 02/16/2018 1224   HGB 13.7 06/29/2017 1224   HGB 12.7 06/06/2015 0000   HCT 37.4 (A) 02/16/2018 1224   HCT 41.0 06/29/2017 1224   HCT 37 06/06/2015 0000   PLT 312 06/29/2017 1224   PLT 300 06/06/2015 0000   MCV 92.5 02/16/2018 1224   MCV 93 06/29/2017 1224    MCH 30.8 02/16/2018 1224   MCH 31.0 01/04/2016 1330   MCHC 33.3 02/16/2018 1224   MCHC 34.3 01/04/2016 1330   RDW 13.3 06/29/2017 1224   LYMPHSABS 1.7 06/29/2017 1224   MONOABS 0.4 01/04/2016 1330   EOSABS 0.2 06/29/2017 1224   BASOSABS 0.0 06/29/2017 1224    No results found for: POCLITH, LITHIUM   No results found for: PHENYTOIN, PHENOBARB, VALPROATE, CBMZ   .res Assessment: Plan:    Plan:  PDMP reviewed  1. Continue Adderall 20mg  BID 2. Continue Prozac 20mg  daily - started 20mg  dose approximately 10 days ago.   RTC 4 weeks  Patient advised to contact office with any questions, adverse effects, or acute worsening in signs and symptoms.  Discussed potential benefits, risks, and side effects of stimulants with patient to include increased heart rate, palpitations, insomnia, increased anxiety, increased irritability, or decreased appetite.  Instructed patient to contact office if experiencing any significant tolerability issues.   There are no diagnoses linked to this encounter.   Please see After Visit Summary for patient specific instructions.  No future appointments.  No orders of the defined types were placed in this encounter.   -------------------------------

## 2020-02-01 ENCOUNTER — Telehealth: Payer: Self-pay | Admitting: Adult Health

## 2020-02-01 ENCOUNTER — Ambulatory Visit (INDEPENDENT_AMBULATORY_CARE_PROVIDER_SITE_OTHER): Payer: BC Managed Care – PPO | Admitting: Adult Health

## 2020-02-01 ENCOUNTER — Other Ambulatory Visit: Payer: Self-pay

## 2020-02-01 ENCOUNTER — Encounter: Payer: Self-pay | Admitting: Adult Health

## 2020-02-01 DIAGNOSIS — F331 Major depressive disorder, recurrent, moderate: Secondary | ICD-10-CM | POA: Diagnosis not present

## 2020-02-01 DIAGNOSIS — F429 Obsessive-compulsive disorder, unspecified: Secondary | ICD-10-CM | POA: Diagnosis not present

## 2020-02-01 DIAGNOSIS — F411 Generalized anxiety disorder: Secondary | ICD-10-CM | POA: Diagnosis not present

## 2020-02-01 DIAGNOSIS — F909 Attention-deficit hyperactivity disorder, unspecified type: Secondary | ICD-10-CM

## 2020-02-01 MED ORDER — FLUOXETINE HCL 40 MG PO CAPS: ORAL_CAPSULE | ORAL | 5 refills | Status: DC

## 2020-02-01 MED ORDER — AMPHETAMINE-DEXTROAMPHETAMINE 20 MG PO TABS: 20.0000 mg | ORAL_TABLET | Freq: Two times a day (BID) | ORAL | 0 refills | Status: DC

## 2020-02-01 NOTE — Progress Notes (Signed)
[name] [name] [id number] 1991/06/02 29 y.o.  Subjective:   Patient ID:  [name] [name] is a 29 y.o. (DOB [birth year]) female.  Chief Complaint: No chief complaint on file.   HPI [name] [name] presents to the office today for follow-up of MDD, GAD, OCD, and ADHD.  Describes mood today as "ok". Pleasant. Communicative. Decreased tearfulness. Mood symptoms - reports decreased depression, anxiety, and irritability. Reports more anxiety overall. Is able to "compartmentalize" things better. Stating "I'm doing much better". Not "afraid" to get up in the mornings. Reports two episodes of vomiting a week ago. History of vomiting in the past. Has seen PCP in the past and was told it was a "stomach bug". Seeing GI doctor today. Stating "overall I feel much better". Waking up and can handle "life" again. Stable interest and motivation. Taking medications as prescribed.  Energy levels stable. Active, trying to exercise when she can. Walking.  Enjoys some usual interests and activities. Married x 5 years - separated from husband. Has two children 5 and 3 - daughters. Husband taking children "some". Husbands family taking children some days.   family local. Mother and father with substance issues. Aunt and uncle supportive. Has supportive friends.  Appetite adequate. Weight stable - 200 pounds. Working on weight loss. Sleeps well most nights. Averages 8 to 10 hours.  Focus and concentration stable with Adderall - crashing around 3 most afternoons. Has been thinking about switching to an XR formula. Completing tasks. Managing some aspects of household. 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 Denies SI or HI. Denies AH or VH. Denies impulsivity, substance issues, increased spending. Racing thoughts off of medication.    Previous medication trials: Adderall, Clonazepam, Lorazepam, Trazadone, Lexapro, Paxil, Zoloft.  PHQ2-9      Video Visit from 01/03/2020 in Primary Care at [facility] Visit from 05/16/2019 in Primary Care at [facility] Telemedicine from 01/14/2019 in Primary Care at [facility] Visit from 08/06/2018 in Primary Care at [facility] Visit from 07/09/2018 in Primary Care at [facility] Total Score  0  0  0  0  0       Review of Systems:  Review of Systems  Musculoskeletal: Negative for gait problem.  Neurological: Negative for tremors.  Psychiatric/Behavioral:       Please refer to HPI    Medications: I have reviewed the patient's current medications.  Current Outpatient Medications  Medication Sig Dispense Refill  . albuterol (VENTOLIN HFA) 108 (90 Base) MCG/ACT inhaler Inhale 1-2 puffs into the lungs every 6 (six) hours as needed for wheezing or shortness of breath. 18 g 3  . amphetamine-dextroamphetamine (ADDERALL) 20 MG tablet Take 1 tablet (20 mg total) by mouth 2 (two) times daily. 60 tablet 0  . azithromycin (ZITHROMAX) 250 MG tablet Take 2 tabs on first day. Then take 1 tab daily. Finish entire supply. 6 tablet 0  . FLUoxetine (PROZAC) 10 MG capsule Take one capsule daily x 7 days, then take two capsules daily. 60 capsule 2  . fluticasone (FLONASE) 50 MCG/ACT nasal spray Place 1 spray into both nostrils 2 (two) times daily. 16 g 4  . guaiFENesin-dextromethorphan (ROBITUSSIN DM) 100-10 MG/5ML syrup Take 5 mLs by mouth every 4 (four) hours as needed for cough. 118 mL 0  . montelukast (SINGULAIR) 10 MG tablet Take 1 tablet (10 mg total) by mouth at bedtime. 30 tablet 3  . predniSONE (STERAPRED UNI-PAK 21 TAB)  10 MG (21) TBPK tablet Take per blister pack instructions. Do not skip doses. Finish entire pack. 1 each 0   No current facility-administered medications for this visit.    Medication Side Effects: None  Allergies:  Allergies  Allergen Reactions  . Ceclor [Cefaclor] Hives    Age 20 mild rash    Past Medical History:  Diagnosis Date  . ADHD (attention deficit hyperactivity  disorder)   . Anxiety   . Panic attacks   . Vaginal Pap smear, abnormal     Family History  Problem Relation Age of Onset  . Hypertension Mother   . Heart disease Father   . Cancer Father        testicular  . Hypertension Father   . Hyperlipidemia Father   . Heart disease Paternal Grandfather     Social History   Socioeconomic History  . Marital status: Married    Spouse name: Not on file  . Number of children: Not on file  . Years of education: Not on file  . Highest education level: Not on file  Occupational History  . Not on file  Tobacco Use  . Smoking status: Former Smoker    Types: Cigarettes    Quit date: 08/15/2013    Years since quitting: 6.4  . Smokeless tobacco: Never Used  Substance and Sexual Activity  . Alcohol use: No    Alcohol/week: 2.0 standard drinks    Types: 2 Standard drinks or equivalent per week    Comment: socially, not with preg  . Drug use: No  . Sexual activity: Not Currently    Birth control/protection: I.U.D.  Other Topics Concern  . Not on file  Social History Narrative  . Not on file   Social Determinants of Health   Financial Resource Strain:   . Difficulty of Paying Living Expenses:   Food Insecurity:   . Worried About Programme researcher, broadcasting/film/video in the Last Year:   . Barista in the Last Year:   Transportation Needs:   . Freight forwarder (Medical):   Marland Kitchen Lack of Transportation (Non-Medical):   Physical Activity:   . Days of Exercise per Week:   . Minutes of Exercise per Session:   Stress:   . Feeling of Stress :   Social Connections:   . Frequency of Communication with Friends and Family:   . Frequency of Social Gatherings with Friends and Family:   . Attends Religious Services:   . Active Member of Clubs or Organizations:   . Attends Banker Meetings:   Marland Kitchen Marital Status:   Intimate Partner Violence:   . Fear of Current or Ex-Partner:   . Emotionally Abused:   Marland Kitchen Physically Abused:   . Sexually  Abused:     Past Medical History, Surgical history, Social history, and Family history were reviewed and updated as appropriate.   Please see review of systems for further details on the patient's review from today.   Objective:   Physical Exam:  There were no vitals taken for this visit.  Physical Exam  Lab Review:     Component Value Date/Time   NA 141 06/29/2017 1224   K 4.4 06/29/2017 1224   CL 103 06/29/2017 1224   CO2 25 06/29/2017 1224   GLUCOSE 80 06/29/2017 1224   GLUCOSE 89 10/24/2015 1235   BUN 8 06/29/2017 1224   CREATININE 0.72 06/29/2017 1224   CALCIUM 9.5 06/29/2017 1224   PROT 7.1 06/29/2017 1224  ALBUMIN 4.5 06/29/2017 1224   AST 16 06/29/2017 1224   ALT 13 06/29/2017 1224   ALKPHOS 81 06/29/2017 1224   BILITOT 0.8 06/29/2017 1224   GFRNONAA 115 06/29/2017 1224   GFRAA 133 06/29/2017 1224       Component Value Date/Time   WBC 16.0 (A) 02/16/2018 1224   WBC 7.1 01/04/2016 1330   RBC 4.05 02/16/2018 1224   RBC 4.22 01/04/2016 1330   HGB 12.5 02/16/2018 1224   HGB 13.7 06/29/2017 1224   HGB 12.7 06/06/2015 0000   HCT 37.4 (A) 02/16/2018 1224   HCT 41.0 06/29/2017 1224   HCT 37 06/06/2015 0000   PLT 312 06/29/2017 1224   PLT 300 06/06/2015 0000   MCV 92.5 02/16/2018 1224   MCV 93 06/29/2017 1224   MCH 30.8 02/16/2018 1224   MCH 31.0 01/04/2016 1330   MCHC 33.3 02/16/2018 1224   MCHC 34.3 01/04/2016 1330   RDW 13.3 06/29/2017 1224   LYMPHSABS 1.7 06/29/2017 1224   MONOABS 0.4 01/04/2016 1330   EOSABS 0.2 06/29/2017 1224   BASOSABS 0.0 06/29/2017 1224    No results found for: POCLITH, LITHIUM   No results found for: PHENYTOIN, PHENOBARB, VALPROATE, CBMZ   .res Assessment: Plan:    Plan:  PDMP reviewed  1. Continue Adderall 20mg  BID 2. Continue Prozac 30mg  daily   RTC 4 weeks  Patient advised to contact office with any questions, adverse effects, or acute worsening in signs and symptoms.  Discussed potential benefits, risks,  and side effects of stimulants with patient to include increased heart rate, palpitations, insomnia, increased anxiety, increased irritability, or decreased appetite.  Instructed patient to contact office if experiencing any significant tolerability issues.   There are no diagnoses linked to this encounter.   Please see After Visit Summary for patient specific instructions.  No future appointments.  No orders of the defined types were placed in this encounter.   -------------------------------

## 2020-02-01 NOTE — Telephone Encounter (Signed)
No charge. 

## 2020-02-29 ENCOUNTER — Ambulatory Visit: Payer: BC Managed Care – PPO | Admitting: Adult Health

## 2020-03-07 ENCOUNTER — Ambulatory Visit (INDEPENDENT_AMBULATORY_CARE_PROVIDER_SITE_OTHER): Payer: BC Managed Care – PPO | Admitting: Adult Health

## 2020-03-07 ENCOUNTER — Encounter: Payer: Self-pay | Admitting: Adult Health

## 2020-03-07 ENCOUNTER — Other Ambulatory Visit: Payer: Self-pay

## 2020-03-07 DIAGNOSIS — F331 Major depressive disorder, recurrent, moderate: Secondary | ICD-10-CM

## 2020-03-07 DIAGNOSIS — F411 Generalized anxiety disorder: Secondary | ICD-10-CM | POA: Diagnosis not present

## 2020-03-07 DIAGNOSIS — F909 Attention-deficit hyperactivity disorder, unspecified type: Secondary | ICD-10-CM | POA: Diagnosis not present

## 2020-03-07 DIAGNOSIS — F429 Obsessive-compulsive disorder, unspecified: Secondary | ICD-10-CM

## 2020-03-07 MED ORDER — AMPHETAMINE-DEXTROAMPHETAMINE 20 MG PO TABS: 20.0000 mg | ORAL_TABLET | Freq: Two times a day (BID) | ORAL | 0 refills | Status: DC

## 2020-03-07 NOTE — Progress Notes (Signed)
[name] [name] [id number] June 28, 1991 29 y.o.  Subjective:   Patient ID:  [name] [name] is a 29 y.o. (DOB 1991/08/04) female.  Chief Complaint: No chief complaint on file.   HPI [name] [name] presents to the office today for follow-up of MDD, GAD, OCD, and ADHD.  Describes mood today as "ok". Pleasant. Communicative. Decreased tearfulness. Mood symptoms - reports decreased depression, anxiety, and irritability. Stating "I'm feeling really good, happy". Children's friends parents have commented on how well she is doing. Concerned about increased "sweating". Not sure if it is the medication or if it is because she's hot. Reports some picking on fingers. Stable interest and motivation. Taking medications as prescribed.  Energy levels stable. Active, trying to exercise more. Walking.  Enjoys some usual interests and activities. Married x 5 years - separated from husband. Has two children 5 and 3 - daughters. Sister recently visited. Family local and supportive. Has supportive friends.  Appetite adequate. Weight stable - 200 pounds. Working on [employer] loss - eating healthier. Sleeps well most nights. Averages 8 to 10 hours.  Focus and concentration stable with Adderall. Completing tasks. Managing aspects of household. Not working currently - plans to start working in the fall. 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 Denies SI or HI. Denies AH or VH.  Previous medication trials: Adderall, Clonazepam, Lorazepam, Trazadone, Lexapro, Paxil, Zoloft.   PHQ2-9     Video Visit from 01/03/2020 in Primary Care at [facility] Visit from 05/16/2019 in Primary Care at [facility] Telemedicine from 01/14/2019 in Primary Care at [facility] Visit from 08/06/2018 in Primary Care at [facility] Visit from 07/09/2018 in Primary Care at [facility] Total Score 0 0 0 0 0       Review of Systems:  Review of Systems  Musculoskeletal: Negative for gait problem.  Neurological: Negative for tremors.   Psychiatric/Behavioral:       Please refer to HPI    Medications: I have reviewed the patient's current medications.  Current Outpatient Medications  Medication Sig Dispense Refill  . albuterol (VENTOLIN HFA) 108 (90 Base) MCG/ACT inhaler Inhale 1-2 puffs into the lungs every 6 (six) hours as needed for wheezing or shortness of breath. 18 g 3  . amphetamine-dextroamphetamine (ADDERALL) 20 MG tablet Take 1 tablet (20 mg total) by mouth 2 (two) times daily. 60 tablet 0  . azithromycin (ZITHROMAX) 250 MG tablet Take 2 tabs on first day. Then take 1 tab daily. Finish entire supply. 6 tablet 0  . FLUoxetine (PROZAC) 40 MG capsule Take one capsule daily. 30 capsule 5  . fluticasone (FLONASE) 50 MCG/ACT nasal spray Place 1 spray into both nostrils 2 (two) times daily. 16 g 4  . guaiFENesin-dextromethorphan (ROBITUSSIN DM) 100-10 MG/5ML syrup Take 5 mLs by mouth every 4 (four) hours as needed for cough. 118 mL 0  . montelukast (SINGULAIR) 10 MG tablet Take 1 tablet (10 mg total) by mouth at bedtime. 30 tablet 3  . predniSONE (STERAPRED UNI-PAK 21 TAB) 10 MG (21) TBPK tablet Take per blister pack instructions. Do not skip doses. Finish entire pack. 1 each 0   No current facility-administered medications for this visit.    Medication Side Effects: None  Allergies:  Allergies  Allergen Reactions  . Ceclor [Cefaclor] Hives    Age 29 mild rash    Past Medical History:  Diagnosis Date  . ADHD (attention deficit hyperactivity disorder)   . Anxiety   . Panic attacks   . Vaginal Pap  smear, abnormal     Family History  Problem Relation Age of Onset  . Hypertension Mother   . Heart disease Father   . Cancer Father        testicular  . Hypertension Father   . Hyperlipidemia Father   . Heart disease Paternal Grandfather     Social History   Socioeconomic History  . Marital status: Married    Spouse name: Not on file  . Number of children: Not on file  . Years of education: Not on  file  . Highest education level: Not on file  Occupational History  . Not on file  Tobacco Use  . Smoking status: Former Smoker    Types: Cigarettes    Quit date: 08/15/2013    Years since quitting: 6.5  . Smokeless tobacco: Never Used  Substance and Sexual Activity  . Alcohol use: No    Alcohol/week: 2.0 standard drinks    Types: 2 Standard drinks or equivalent per week    Comment: socially, not with preg  . Drug use: No  . Sexual activity: Not Currently    Birth control/protection: I.U.D.  Other Topics Concern  . Not on file  Social History Narrative  . Not on file   Social Determinants of Health   Financial Resource Strain:   . Difficulty of Paying Living Expenses:   Food Insecurity:   . Worried About Programme researcher, broadcasting/film/video in the Last Year:   . Barista in the Last Year:   Transportation Needs:   . Freight forwarder (Medical):   Marland Kitchen Lack of Transportation (Non-Medical):   Physical Activity:   . Days of Exercise per Week:   . Minutes of Exercise per Session:   Stress:   . Feeling of Stress :   Social Connections:   . Frequency of Communication with Friends and Family:   . Frequency of Social Gatherings with Friends and Family:   . Attends Religious Services:   . Active Member of Clubs or Organizations:   . Attends Banker Meetings:   Marland Kitchen Marital Status:   Intimate Partner Violence:   . Fear of Current or Ex-Partner:   . Emotionally Abused:   Marland Kitchen Physically Abused:   . Sexually Abused:     Past Medical History, Surgical history, Social history, and Family history were reviewed and updated as appropriate.   Please see review of systems for further details on the patient's review from today.   Objective:   Physical Exam:  There were no vitals taken for this visit.  Physical Exam Constitutional:      General: She is not in acute distress. Musculoskeletal:        General: No deformity.  Neurological:     Mental Status: She is alert and  oriented to person, place, and time.     Coordination: Coordination normal.  Psychiatric:        Attention and Perception: Attention and perception normal. She does not perceive auditory or visual hallucinations.        Mood and Affect: Mood normal. Mood is not anxious or depressed. Affect is not labile, blunt, angry or inappropriate.        Speech: Speech normal.        Behavior: Behavior normal.        Thought Content: Thought content normal. Thought content is not paranoid or delusional. Thought content does not include homicidal or suicidal ideation. Thought content does not include homicidal or suicidal  plan.        Cognition and Memory: Cognition and memory normal.        Judgment: Judgment normal.     Comments: Insight intact     Lab Review:     Component Value Date/Time   NA 141 06/29/2017 1224   K 4.4 06/29/2017 1224   CL 103 06/29/2017 1224   CO2 25 06/29/2017 1224   GLUCOSE 80 06/29/2017 1224   GLUCOSE 89 10/24/2015 1235   BUN 8 06/29/2017 1224   CREATININE 0.72 06/29/2017 1224   CALCIUM 9.5 06/29/2017 1224   PROT 7.1 06/29/2017 1224   ALBUMIN 4.5 06/29/2017 1224   AST 16 06/29/2017 1224   ALT 13 06/29/2017 1224   ALKPHOS 81 06/29/2017 1224   BILITOT 0.8 06/29/2017 1224   GFRNONAA 115 06/29/2017 1224   GFRAA 133 06/29/2017 1224       Component Value Date/Time   WBC 16.0 (A) 02/16/2018 1224   WBC 7.1 01/04/2016 1330   RBC 4.05 02/16/2018 1224   RBC 4.22 01/04/2016 1330   HGB 12.5 02/16/2018 1224   HGB 13.7 06/29/2017 1224   HGB 12.7 06/06/2015 0000   HCT 37.4 (A) 02/16/2018 1224   HCT 41.0 06/29/2017 1224   HCT 37 06/06/2015 0000   PLT 312 06/29/2017 1224   PLT 300 06/06/2015 0000   MCV 92.5 02/16/2018 1224   MCV 93 06/29/2017 1224   MCH 30.8 02/16/2018 1224   MCH 31.0 01/04/2016 1330   MCHC 33.3 02/16/2018 1224   MCHC 34.3 01/04/2016 1330   RDW 13.3 06/29/2017 1224   LYMPHSABS 1.7 06/29/2017 1224   MONOABS 0.4 01/04/2016 1330   EOSABS 0.2  06/29/2017 1224   BASOSABS 0.0 06/29/2017 1224    No results found for: POCLITH, LITHIUM   No results found for: PHENYTOIN, PHENOBARB, VALPROATE, CBMZ   .res Assessment: Plan:    Plan:  PDMP reviewed  1. Continue Adderall 20mg  BID 2. Continue Prozac 40mg  daily   RTC 4 weeks  Patient advised to contact office with any questions, adverse effects, or acute worsening in signs and symptoms.  Discussed potential benefits, risks, and side effects of stimulants with patient to include increased heart rate, palpitations, insomnia, increased anxiety, increased irritability, or decreased appetite.  Instructed patient to contact office if experiencing any significant tolerability issues.    Diagnoses and all orders for this visit:  Obsessive-compulsive disorder, unspecified type  Attention deficit hyperactivity disorder (ADHD), unspecified ADHD type -     amphetamine-dextroamphetamine (ADDERALL) 20 MG tablet; Take 1 tablet (20 mg total) by mouth 2 (two) times daily.  Generalized anxiety disorder  Major depressive disorder, recurrent episode, moderate ([place])     Please see After Visit Summary for patient specific instructions.  No future appointments.  No orders of the defined types were placed in this encounter.   -------------------------------

## 2020-04-04 ENCOUNTER — Ambulatory Visit: Payer: BC Managed Care – PPO | Admitting: Adult Health

## 2020-04-05 ENCOUNTER — Other Ambulatory Visit: Payer: Self-pay

## 2020-04-05 ENCOUNTER — Telehealth: Payer: Self-pay | Admitting: Adult Health

## 2020-04-05 DIAGNOSIS — F909 Attention-deficit hyperactivity disorder, unspecified type: Secondary | ICD-10-CM

## 2020-04-05 MED ORDER — AMPHETAMINE-DEXTROAMPHETAMINE 20 MG PO TABS: 20.0000 mg | ORAL_TABLET | Freq: Two times a day (BID) | ORAL | 0 refills | Status: DC

## 2020-04-05 NOTE — Telephone Encounter (Signed)
Last refill 03/07/2020 Pended for Rene Kocher to submit

## 2020-04-05 NOTE — Telephone Encounter (Signed)
Lovey called for refill on Adderall. [employer] [facility]. Made appt 7/26

## 2020-04-09 ENCOUNTER — Ambulatory Visit: Payer: BC Managed Care – PPO | Admitting: Adult Health

## 2020-04-18 ENCOUNTER — Ambulatory Visit (INDEPENDENT_AMBULATORY_CARE_PROVIDER_SITE_OTHER): Payer: BC Managed Care – PPO | Admitting: Adult Health

## 2020-04-18 ENCOUNTER — Other Ambulatory Visit: Payer: Self-pay

## 2020-04-18 ENCOUNTER — Encounter: Payer: Self-pay | Admitting: Adult Health

## 2020-04-18 DIAGNOSIS — F411 Generalized anxiety disorder: Secondary | ICD-10-CM

## 2020-04-18 DIAGNOSIS — F909 Attention-deficit hyperactivity disorder, unspecified type: Secondary | ICD-10-CM | POA: Diagnosis not present

## 2020-04-18 DIAGNOSIS — F331 Major depressive disorder, recurrent, moderate: Secondary | ICD-10-CM | POA: Diagnosis not present

## 2020-04-18 DIAGNOSIS — F429 Obsessive-compulsive disorder, unspecified: Secondary | ICD-10-CM

## 2020-04-18 MED ORDER — AMPHETAMINE-DEXTROAMPHETAMINE 20 MG PO TABS: 20.0000 mg | ORAL_TABLET | Freq: Two times a day (BID) | ORAL | 0 refills | Status: DC

## 2020-04-18 NOTE — Progress Notes (Signed)
[name] [name] [id number] 09-25-1990 29 y.o.  Subjective:   Patient ID:  [name] [name] is a 29 y.o. (DOB 06/28/1991) female.  Chief Complaint: No chief complaint on file.   HPI [name] presents to the office today for follow-up of MDD, GAD, OCD, and ADHD.  Describes mood today as "ok". Pleasant. Decreased tearfulness. Mood symptoms - reports decreased depression, anxiety, and irritability. Stating "I've been feeling kind of blah lately". Feels like a lack of routine and predictability is contributing to decline in mood.Children's routines have changed. One daughter had a burn at camp - other daughter with a fracture. Single parenting. Decreased sweating. Stable interest and motivation. Taking medications as prescribed.  Energy levels stable. Active, trying to exercise more. Walking several times a day.  Enjoys some usual interests and activities. Married, but separated. Lives with 2 daughters - 5 and 4 - daughters. Family local and supportive. Has supportive friends.  Appetite adequate. Weight stable - 200 pounds. Working on weight loss - "self conscious about it". Sleeps well most nights. Averages 8 to 10 hours. Wakes up throughout the night.  Focus and concentration stable with Adderall. Completing tasks. Managing aspects of household. Not working currently - looking for a job. 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 Denies SI or HI. Denies AH or VH.  Previous medication trials: Adderall, Clonazepam, Lorazepam, Trazadone, Lexapro, Paxil, Zoloft.  PHQ2-9     Video Visit from 01/03/2020 in Primary Care at [facility] Visit from 05/16/2019 in Primary Care at [facility] Telemedicine from 01/14/2019 in Primary Care at [facility] Visit from 08/06/2018 in Primary Care at [facility] Visit from 07/09/2018 in Primary Care at [facility] Total Score 0 0 0 0 0       Review of Systems:  Review of Systems  Musculoskeletal: Negative for gait problem.  Neurological: Negative for  tremors.  Psychiatric/Behavioral:       Please refer to HPI    Medications: I have reviewed the patient's current medications.  Current Outpatient Medications  Medication Sig Dispense Refill  . albuterol (VENTOLIN HFA) 108 (90 Base) MCG/ACT inhaler Inhale 1-2 puffs into the lungs every 6 (six) hours as needed for wheezing or shortness of breath. 18 g 3  . amphetamine-dextroamphetamine (ADDERALL) 20 MG tablet Take 1 tablet (20 mg total) by mouth 2 (two) times daily. 60 tablet 0  . azithromycin (ZITHROMAX) 250 MG tablet Take 2 tabs on first day. Then take 1 tab daily. Finish entire supply. 6 tablet 0  . FLUoxetine (PROZAC) 40 MG capsule Take one capsule daily. 30 capsule 5  . fluticasone (FLONASE) 50 MCG/ACT nasal spray Place 1 spray into both nostrils 2 (two) times daily. 16 g 4  . guaiFENesin-dextromethorphan (ROBITUSSIN DM) 100-10 MG/5ML syrup Take 5 mLs by mouth every 4 (four) hours as needed for cough. 118 mL 0  . montelukast (SINGULAIR) 10 MG tablet Take 1 tablet (10 mg total) by mouth at bedtime. 30 tablet 3  . predniSONE (STERAPRED UNI-PAK 21 TAB) 10 MG (21) TBPK tablet Take per blister pack instructions. Do not skip doses. Finish entire pack. 1 each 0   No current facility-administered medications for this visit.    Medication Side Effects: None  Allergies:  Allergies  Allergen Reactions  . Ceclor [Cefaclor] Hives    Age 31 mild rash    Past Medical History:  Diagnosis Date  . ADHD (attention deficit hyperactivity disorder)   . Anxiety   . Panic attacks   .  Vaginal Pap smear, abnormal     Family History  Problem Relation Age of Onset  . Hypertension Mother   . Heart disease Father   . Cancer Father        testicular  . Hypertension Father   . Hyperlipidemia Father   . Heart disease Paternal Grandfather     Social History   Socioeconomic History  . Marital status: Married    Spouse name: Not on file  . Number of children: Not on file  . Years of education:  Not on file  . Highest education level: Not on file  Occupational History  . Not on file  Tobacco Use  . Smoking status: Former Smoker    Types: Cigarettes    Quit date: 08/15/2013    Years since quitting: 6.6  . Smokeless tobacco: Never Used  Substance and Sexual Activity  . Alcohol use: No    Alcohol/week: 2.0 standard drinks    Types: 2 Standard drinks or equivalent per week    Comment: socially, not with preg  . Drug use: No  . Sexual activity: Not Currently    Birth control/protection: I.U.D.  Other Topics Concern  . Not on file  Social History Narrative  . Not on file   Social Determinants of Health   Financial Resource Strain:   . Difficulty of Paying Living Expenses:   Food Insecurity:   . Worried About Programme researcher, broadcasting/film/video in the Last Year:   . Barista in the Last Year:   Transportation Needs:   . Freight forwarder (Medical):   Marland Kitchen Lack of Transportation (Non-Medical):   Physical Activity:   . Days of Exercise per Week:   . Minutes of Exercise per Session:   Stress:   . Feeling of Stress :   Social Connections:   . Frequency of Communication with Friends and Family:   . Frequency of Social Gatherings with Friends and Family:   . Attends Religious Services:   . Active Member of Clubs or Organizations:   . Attends Banker Meetings:   Marland Kitchen Marital Status:   Intimate Partner Violence:   . Fear of Current or Ex-Partner:   . Emotionally Abused:   Marland Kitchen Physically Abused:   . Sexually Abused:     Past Medical History, Surgical history, Social history, and Family history were reviewed and updated as appropriate.   Please see review of systems for further details on the patient's review from today.   Objective:   Physical Exam:  There were no vitals taken for this visit.  Physical Exam Constitutional:      General: She is not in acute distress. Musculoskeletal:        General: No deformity.  Neurological:     Mental Status: She is  alert and oriented to person, place, and time.     Coordination: Coordination normal.  Psychiatric:        Attention and Perception: Attention and perception normal. She does not perceive auditory or visual hallucinations.        Mood and Affect: Mood normal. Mood is not anxious or depressed. Affect is not labile, blunt, angry or inappropriate.        Speech: Speech normal.        Behavior: Behavior normal.        Thought Content: Thought content normal. Thought content is not paranoid or delusional. Thought content does not include homicidal or suicidal ideation. Thought content does not include homicidal  or suicidal plan.        Cognition and Memory: Cognition and memory normal.        Judgment: Judgment normal.     Comments: Insight intact     Lab Review:     Component Value Date/Time   NA 141 06/29/2017 1224   K 4.4 06/29/2017 1224   CL 103 06/29/2017 1224   CO2 25 06/29/2017 1224   GLUCOSE 80 06/29/2017 1224   GLUCOSE 89 10/24/2015 1235   BUN 8 06/29/2017 1224   CREATININE 0.72 06/29/2017 1224   CALCIUM 9.5 06/29/2017 1224   PROT 7.1 06/29/2017 1224   ALBUMIN 4.5 06/29/2017 1224   AST 16 06/29/2017 1224   ALT 13 06/29/2017 1224   ALKPHOS 81 06/29/2017 1224   BILITOT 0.8 06/29/2017 1224   GFRNONAA 115 06/29/2017 1224   GFRAA 133 06/29/2017 1224       Component Value Date/Time   WBC 16.0 (A) 02/16/2018 1224   WBC 7.1 01/04/2016 1330   RBC 4.05 02/16/2018 1224   RBC 4.22 01/04/2016 1330   HGB 12.5 02/16/2018 1224   HGB 13.7 06/29/2017 1224   HGB 12.7 06/06/2015 0000   HCT 37.4 (A) 02/16/2018 1224   HCT 41.0 06/29/2017 1224   HCT 37 06/06/2015 0000   PLT 312 06/29/2017 1224   PLT 300 06/06/2015 0000   MCV 92.5 02/16/2018 1224   MCV 93 06/29/2017 1224   MCH 30.8 02/16/2018 1224   MCH 31.0 01/04/2016 1330   MCHC 33.3 02/16/2018 1224   MCHC 34.3 01/04/2016 1330   RDW 13.3 06/29/2017 1224   LYMPHSABS 1.7 06/29/2017 1224   MONOABS 0.4 01/04/2016 1330   EOSABS  0.2 06/29/2017 1224   BASOSABS 0.0 06/29/2017 1224    No results found for: POCLITH, LITHIUM   No results found for: PHENYTOIN, PHENOBARB, VALPROATE, CBMZ   .res Assessment: Plan:    Plan:  PDMP reviewed  1. Continue Adderall 20mg  BID 2. Continue Prozac 40mg  daily   RTC 4/6 weeks  Patient advised to contact office with any questions, adverse effects, or acute worsening in signs and symptoms.  Discussed potential benefits, risks, and side effects of stimulants with patient to include increased heart rate, palpitations, insomnia, increased anxiety, increased irritability, or decreased appetite.  Instructed patient to contact office if experiencing any significant tolerability issues.   Diagnoses and all orders for this visit:  Major depressive disorder, recurrent episode, moderate ([facility])  Attention deficit hyperactivity disorder (ADHD), unspecified ADHD type -     amphetamine-dextroamphetamine (ADDERALL) 20 MG tablet; Take 1 tablet (20 mg total) by mouth 2 (two) times daily.  Obsessive-compulsive disorder, unspecified type  Generalized anxiety disorder     Please see After Visit Summary for patient specific instructions.  No future appointments.  No orders of the defined types were placed in this encounter.   -------------------------------

## 2020-05-25 ENCOUNTER — Other Ambulatory Visit: Payer: Self-pay | Admitting: Adult Health

## 2020-05-25 DIAGNOSIS — F909 Attention-deficit hyperactivity disorder, unspecified type: Secondary | ICD-10-CM

## 2020-05-30 ENCOUNTER — Ambulatory Visit: Payer: BC Managed Care – PPO | Admitting: Adult Health

## 2020-06-04 ENCOUNTER — Encounter: Payer: Self-pay | Admitting: Adult Health

## 2020-06-04 ENCOUNTER — Telehealth (INDEPENDENT_AMBULATORY_CARE_PROVIDER_SITE_OTHER): Payer: BC Managed Care – PPO | Admitting: Adult Health

## 2020-06-04 DIAGNOSIS — F411 Generalized anxiety disorder: Secondary | ICD-10-CM | POA: Diagnosis not present

## 2020-06-04 DIAGNOSIS — F909 Attention-deficit hyperactivity disorder, unspecified type: Secondary | ICD-10-CM

## 2020-06-04 DIAGNOSIS — F428 Other obsessive-compulsive disorder: Secondary | ICD-10-CM

## 2020-06-04 DIAGNOSIS — F331 Major depressive disorder, recurrent, moderate: Secondary | ICD-10-CM | POA: Diagnosis not present

## 2020-06-04 MED ORDER — AMPHETAMINE-DEXTROAMPHETAMINE 20 MG PO TABS: 20.0000 mg | ORAL_TABLET | Freq: Two times a day (BID) | ORAL | 0 refills | Status: DC

## 2020-06-04 MED ORDER — FLUOXETINE HCL 10 MG PO CAPS: ORAL_CAPSULE | ORAL | 2 refills | Status: DC

## 2020-06-04 NOTE — Progress Notes (Signed)
[name] [name] [id number] 25-Mar-1991 29 y.o.  Virtual Visit via Telephone Note  I connected with pt on 06/04/20 at 11:20 AM EDT by telephone and verified that I am speaking with the correct person using two identifiers.   I discussed the limitations, risks, security and privacy concerns of performing an evaluation and management service by telephone and the availability of in person appointments. I also discussed with the patient that there may be a patient responsible charge related to this service. The patient expressed understanding and agreed to proceed.   I discussed the assessment and treatment plan with the patient. The patient was provided an opportunity to ask questions and all were answered. The patient agreed with the plan and demonstrated an understanding of the instructions.   The patient was advised to call back or seek an in-person evaluation if the symptoms worsen or if the condition fails to improve as anticipated.  I provided 30 minutes of non-face-to-face time during this encounter.  The patient was located at home.  The provider was located at [facility].   Dorothyann Gibbs, NP   Subjective:   Patient ID:  [name] [name] is a 29 y.o. (DOB [birth year]) female.  Chief Complaint: No chief complaint on file.   HPI [name] [name] presents for follow-up of MDD, GAD, Obsessional thoughts, and ADHD.  Describes mood today as "ok". Pleasant. Decreased tearfulness. Mood symptoms - reports decreased depression, anxiety, and irritability. Stating "I think I'm more sluggish on the 40mg  dose of Prozac. Stating "I'm always tired and worn out". Mother has commented on her "dullness". Has not been consistent with Adderall, but has been for the past 3 weeks. Having to push herself to get going. Getting up in the mornings and getting daughter ready school. Single parenting. Stable interest and motivation. Taking medications as prescribed.  Energy levels decreased. Active,  trying to exercise. more. Walking. Enjoys some usual interests and activities. Married, but separated. Lives with 2 daughters. Family local and supportive. Has supportive friends.  Appetite adequate. Weight stable - 200 pounds. Working on weight loss. Sleeps well most nights. Averages 10 to 12 hours. Up and down at night with daughter.  Focus and concentration stable with Adderall. Completing tasks. Managing aspects of household. Not working currently - looking for a job. 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 Denies SI or HI. Denies AH or VH.  Previous medication trials: Adderall, Clonazepam, Lorazepam, Trazadone, Lexapro, Paxil, Zoloft.    Review of Systems:  Review of Systems  Musculoskeletal: Negative for gait problem.  Neurological: Negative for tremors.  Psychiatric/Behavioral:       Please refer to HPI    Medications: I have reviewed the patient's current medications.  Current Outpatient Medications  Medication Sig Dispense Refill  . albuterol (VENTOLIN HFA) 108 (90 Base) MCG/ACT inhaler Inhale 1-2 puffs into the lungs every 6 (six) hours as needed for wheezing or shortness of breath. 18 g 3  . amphetamine-dextroamphetamine (ADDERALL) 20 MG tablet TAKE 1 TABLET BY MOUTH TWICE DAILY. 60 tablet 0  . azithromycin (ZITHROMAX) 250 MG tablet Take 2 tabs on first day. Then take 1 tab daily. Finish entire supply. 6 tablet 0  . FLUoxetine (PROZAC) 40 MG capsule Take one capsule daily. 30 capsule 5  . fluticasone (FLONASE) 50 MCG/ACT nasal spray Place 1 spray into both nostrils 2 (two) times daily. 16 g 4  . guaiFENesin-dextromethorphan (ROBITUSSIN DM) 100-10 MG/5ML syrup Take 5 mLs by mouth every 4 (four) hours as needed  for cough. 118 mL 0  . montelukast (SINGULAIR) 10 MG tablet Take 1 tablet (10 mg total) by mouth at bedtime. 30 tablet 3  . predniSONE (STERAPRED UNI-PAK 21 TAB) 10 MG (21) TBPK tablet Take per blister pack instructions. Do not skip doses. Finish entire pack. 1  each 0   No current facility-administered medications for this visit.    Medication Side Effects: None  Allergies:  Allergies  Allergen Reactions  . Ceclor [Cefaclor] Hives    Age 29 mild rash    Past Medical History:  Diagnosis Date  . ADHD (attention deficit hyperactivity disorder)   . Anxiety   . Panic attacks   . Vaginal Pap smear, abnormal     Family History  Problem Relation Age of Onset  . Hypertension Mother   . Heart disease Father   . Cancer Father        testicular  . Hypertension Father   . Hyperlipidemia Father   . Heart disease Paternal Grandfather     Social History   Socioeconomic History  . Marital status: Married    Spouse name: Not on file  . Number of children: Not on file  . Years of education: Not on file  . Highest education level: Not on file  Occupational History  . Not on file  Tobacco Use  . Smoking status: Former Smoker    Types: Cigarettes    Quit date: 08/15/2013    Years since quitting: 6.8  . Smokeless tobacco: Never Used  Substance and Sexual Activity  . Alcohol use: No    Alcohol/week: 2.0 standard drinks    Types: 2 Standard drinks or equivalent per week    Comment: socially, not with preg  . Drug use: No  . Sexual activity: Not Currently    Birth control/protection: I.U.D.  Other Topics Concern  . Not on file  Social History Narrative  . Not on file   Social Determinants of Health   Financial Resource Strain:   . Difficulty of Paying Living Expenses: Not on file  Food Insecurity:   . Worried About Programme researcher, broadcasting/film/video in the Last Year: Not on file  . Ran Out of Food in the Last Year: Not on file  Transportation Needs:   . Lack of Transportation (Medical): Not on file  . Lack of Transportation (Non-Medical): Not on file  Physical Activity:   . Days of Exercise per Week: Not on file  . Minutes of Exercise per Session: Not on file  Stress:   . Feeling of Stress : Not on file  Social Connections:   . Frequency  of Communication with Friends and Family: Not on file  . Frequency of Social Gatherings with Friends and Family: Not on file  . Attends Religious Services: Not on file  . Active Member of Clubs or Organizations: Not on file  . Attends Banker Meetings: Not on file  . Marital Status: Not on file  Intimate Partner Violence:   . Fear of Current or Ex-Partner: Not on file  . Emotionally Abused: Not on file  . Physically Abused: Not on file  . Sexually Abused: Not on file    Past Medical History, Surgical history, Social history, and Family history were reviewed and updated as appropriate.   Please see review of systems for further details on the patient's review from today.   Objective:   Physical Exam:  There were no vitals taken for this visit.  Physical Exam Constitutional:  General: She is not in acute distress. Musculoskeletal:        General: No deformity.  Neurological:     Mental Status: She is alert and oriented to person, place, and time.     Coordination: Coordination normal.  Psychiatric:        Attention and Perception: Attention and perception normal. She does not perceive auditory or visual hallucinations.        Mood and Affect: Mood normal. Mood is not anxious or depressed. Affect is not labile, blunt, angry or inappropriate.        Speech: Speech normal.        Behavior: Behavior normal.        Thought Content: Thought content normal. Thought content is not paranoid or delusional. Thought content does not include homicidal or suicidal ideation. Thought content does not include homicidal or suicidal plan.        Cognition and Memory: Cognition and memory normal.        Judgment: Judgment normal.     Comments: Insight intact     Lab Review:     Component Value Date/Time   NA 141 06/29/2017 1224   K 4.4 06/29/2017 1224   CL 103 06/29/2017 1224   CO2 25 06/29/2017 1224   GLUCOSE 80 06/29/2017 1224   GLUCOSE 89 10/24/2015 1235   BUN 8  06/29/2017 1224   CREATININE 0.72 06/29/2017 1224   CALCIUM 9.5 06/29/2017 1224   PROT 7.1 06/29/2017 1224   ALBUMIN 4.5 06/29/2017 1224   AST 16 06/29/2017 1224   ALT 13 06/29/2017 1224   ALKPHOS 81 06/29/2017 1224   BILITOT 0.8 06/29/2017 1224   GFRNONAA 115 06/29/2017 1224   GFRAA 133 06/29/2017 1224       Component Value Date/Time   WBC 16.0 (A) 02/16/2018 1224   WBC 7.1 01/04/2016 1330   RBC 4.05 02/16/2018 1224   RBC 4.22 01/04/2016 1330   HGB 12.5 02/16/2018 1224   HGB 13.7 06/29/2017 1224   HGB 12.7 06/06/2015 0000   HCT 37.4 (A) 02/16/2018 1224   HCT 41.0 06/29/2017 1224   HCT 37 06/06/2015 0000   PLT 312 06/29/2017 1224   PLT 300 06/06/2015 0000   MCV 92.5 02/16/2018 1224   MCV 93 06/29/2017 1224   MCH 30.8 02/16/2018 1224   MCH 31.0 01/04/2016 1330   MCHC 33.3 02/16/2018 1224   MCHC 34.3 01/04/2016 1330   RDW 13.3 06/29/2017 1224   LYMPHSABS 1.7 06/29/2017 1224   MONOABS 0.4 01/04/2016 1330   EOSABS 0.2 06/29/2017 1224   BASOSABS 0.0 06/29/2017 1224    No results found for: POCLITH, LITHIUM   No results found for: PHENYTOIN, PHENOBARB, VALPROATE, CBMZ   .res Assessment: Plan:    Plan:  PDMP reviewed  1. Continue Adderall 20mg  BID 2. Decrease Prozac 40mg  30mg  daily   RTC 4/6 weeks  Patient advised to contact office with any questions, adverse effects, or acute worsening in signs and symptoms.  Discussed potential benefits, risks, and side effects of stimulants with patient to include increased heart rate, palpitations, insomnia, increased anxiety, increased irritability, or decreased appetite.  Instructed patient to contact office if experiencing any significant tolerability issues.   Diagnoses and all orders for this visit:  Attention deficit hyperactivity disorder (ADHD), unspecified ADHD type  Major depressive disorder, recurrent episode, moderate (HCC)  Generalized anxiety disorder  Obsessional thoughts    Please see After Visit  Summary for patient specific instructions.  No future appointments.  No orders of the defined types were placed in this encounter.     -------------------------------

## 2020-07-01 ENCOUNTER — Ambulatory Visit: Payer: Self-pay

## 2020-07-02 ENCOUNTER — Ambulatory Visit: Payer: BC Managed Care – PPO | Admitting: Adult Health

## 2020-07-03 DIAGNOSIS — L03313 Cellulitis of chest wall: Secondary | ICD-10-CM | POA: Diagnosis not present

## 2020-07-16 DIAGNOSIS — L0390 Cellulitis, unspecified: Secondary | ICD-10-CM | POA: Diagnosis not present

## 2020-07-18 DIAGNOSIS — L089 Local infection of the skin and subcutaneous tissue, unspecified: Secondary | ICD-10-CM | POA: Diagnosis not present

## 2020-07-25 ENCOUNTER — Other Ambulatory Visit: Payer: Self-pay | Admitting: Registered Nurse

## 2020-07-25 ENCOUNTER — Other Ambulatory Visit: Payer: Self-pay | Admitting: Adult Health

## 2020-07-25 DIAGNOSIS — R0981 Nasal congestion: Secondary | ICD-10-CM

## 2020-07-25 DIAGNOSIS — F909 Attention-deficit hyperactivity disorder, unspecified type: Secondary | ICD-10-CM

## 2020-07-26 NOTE — Telephone Encounter (Signed)
No show last apt Last visit 06/04/20

## 2020-07-26 NOTE — Telephone Encounter (Signed)
Script sent  

## 2020-08-16 ENCOUNTER — Emergency Department (HOSPITAL_COMMUNITY)
Admission: EM | Admit: 2020-08-16 | Discharge: 2020-08-16 | Disposition: A | Discharge status: Home / Self Care | Payer: BC Managed Care – PPO | Attending: Emergency Medicine | Admitting: Emergency Medicine

## 2020-08-16 ENCOUNTER — Encounter: Payer: Self-pay | Admitting: Adult Health

## 2020-08-16 ENCOUNTER — Telehealth: Payer: Self-pay | Admitting: Adult Health

## 2020-08-16 ENCOUNTER — Emergency Department (HOSPITAL_COMMUNITY): Payer: BC Managed Care – PPO

## 2020-08-16 ENCOUNTER — Ambulatory Visit (INDEPENDENT_AMBULATORY_CARE_PROVIDER_SITE_OTHER): Payer: BC Managed Care – PPO | Admitting: Adult Health

## 2020-08-16 DIAGNOSIS — Z87891 Personal history of nicotine dependence: Secondary | ICD-10-CM | POA: Insufficient documentation

## 2020-08-16 DIAGNOSIS — F331 Major depressive disorder, recurrent, moderate: Secondary | ICD-10-CM

## 2020-08-16 DIAGNOSIS — Z91040 Latex allergy status: Secondary | ICD-10-CM | POA: Insufficient documentation

## 2020-08-16 DIAGNOSIS — F909 Attention-deficit hyperactivity disorder, unspecified type: Secondary | ICD-10-CM | POA: Insufficient documentation

## 2020-08-16 DIAGNOSIS — Y92410 Unspecified street and highway as the place of occurrence of the external cause: Secondary | ICD-10-CM | POA: Insufficient documentation

## 2020-08-16 DIAGNOSIS — F411 Generalized anxiety disorder: Secondary | ICD-10-CM

## 2020-08-16 DIAGNOSIS — F428 Other obsessive-compulsive disorder: Secondary | ICD-10-CM

## 2020-08-16 DIAGNOSIS — F419 Anxiety disorder, unspecified: Secondary | ICD-10-CM | POA: Diagnosis not present

## 2020-08-16 DIAGNOSIS — Z20822 Contact with and (suspected) exposure to covid-19: Secondary | ICD-10-CM | POA: Insufficient documentation

## 2020-08-16 DIAGNOSIS — R4586 Emotional lability: Secondary | ICD-10-CM | POA: Diagnosis not present

## 2020-08-16 DIAGNOSIS — Z041 Encounter for examination and observation following transport accident: Secondary | ICD-10-CM | POA: Diagnosis not present

## 2020-08-16 DIAGNOSIS — R9431 Abnormal electrocardiogram [ECG] [EKG]: Secondary | ICD-10-CM | POA: Diagnosis not present

## 2020-08-16 LAB — CBC WITH DIFFERENTIAL/PLATELET
Abs Immature Granulocytes: 0.04 10*3/uL (ref 0.00–0.07)
Basophils Absolute: 0 10*3/uL (ref 0.0–0.1)
Basophils Relative: 0 %
Eosinophils Absolute: 0.1 10*3/uL (ref 0.0–0.5)
Eosinophils Relative: 1 %
HCT: 42.2 % (ref 36.0–46.0)
Hemoglobin: 13.7 g/dL (ref 12.0–15.0)
Immature Granulocytes: 0 %
Lymphocytes Relative: 15 %
Lymphs Abs: 1.7 10*3/uL (ref 0.7–4.0)
MCH: 29.6 pg (ref 26.0–34.0)
MCHC: 32.5 g/dL (ref 30.0–36.0)
MCV: 91.1 fL (ref 80.0–100.0)
Monocytes Absolute: 0.6 10*3/uL (ref 0.1–1.0)
Monocytes Relative: 5 %
Neutro Abs: 9 10*3/uL — ABNORMAL HIGH (ref 1.7–7.7)
Neutrophils Relative %: 79 %
Platelets: 368 10*3/uL (ref 150–400)
RBC: 4.63 MIL/uL (ref 3.87–5.11)
RDW: 12.3 % (ref 11.5–15.5)
WBC: 11.4 10*3/uL — ABNORMAL HIGH (ref 4.0–10.5)
nRBC: 0 % (ref 0.0–0.2)

## 2020-08-16 LAB — ETHANOL: Alcohol, Ethyl (B): <10 mg/dL (ref <10)

## 2020-08-16 LAB — COMPREHENSIVE METABOLIC PANEL
ALT: 38 U/L (ref 0–44)
AST: 23 U/L (ref 15–41)
Albumin: 4.2 g/dL (ref 3.5–5.0)
Alkaline Phosphatase: 91 U/L (ref 38–126)
Anion gap: 9 (ref 5–15)
BUN: 9 mg/dL (ref 6–20)
CO2: 25 mmol/L (ref 22–32)
Calcium: 9.4 mg/dL (ref 8.9–10.3)
Chloride: 103 mmol/L (ref 98–111)
Creatinine, Ser: 0.83 mg/dL (ref 0.44–1.00)
GFR, Estimated: >60 mL/min (ref >60)
Glucose, Bld: 113 mg/dL — ABNORMAL HIGH (ref 70–99)
Potassium: 4.3 mmol/L (ref 3.5–5.1)
Sodium: 137 mmol/L (ref 135–145)
Total Bilirubin: 1 mg/dL (ref 0.3–1.2)
Total Protein: 7.7 g/dL (ref 6.5–8.1)

## 2020-08-16 LAB — RESP PANEL BY RT-PCR (FLU A&B, COVID) ARPGX2
Influenza A by PCR: NEGATIVE
Influenza B by PCR: NEGATIVE
SARS Coronavirus 2 by RT PCR: NEGATIVE

## 2020-08-16 LAB — I-STAT BETA HCG BLOOD, ED (MC, WL, AP ONLY): I-stat hCG, quantitative: <5 m[IU]/mL (ref <5)
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
## 2020-08-16 MED ORDER — ACETAMINOPHEN 325 MG PO TABS
650.0000 mg | ORAL_TABLET | ORAL | Status: DC | PRN
  Filled 2020-08-16: qty 2

## 2020-08-16 MED ORDER — AMPHETAMINE-DEXTROAMPHETAMINE 10 MG PO TABS
20.0000 mg | ORAL_TABLET | Freq: Two times a day (BID) | ORAL | Status: DC
  Filled 2020-08-16: qty 2

## 2020-08-16 MED ORDER — FLUOXETINE HCL 20 MG PO CAPS
30.0000 mg | ORAL_CAPSULE | Freq: Every day | ORAL | Status: DC
  Administered 2020-08-16: 30 mg via ORAL
  Filled 2020-08-16: qty 1

## 2020-08-16 MED ORDER — KETOROLAC TROMETHAMINE 30 MG/ML IJ SOLN
15.0000 mg | Freq: Once | INTRAMUSCULAR | Status: AC
  Administered 2020-08-16: 15 mg via INTRAMUSCULAR
  Filled 2020-08-16: qty 1

## 2020-08-16 NOTE — ED Notes (Signed)
Pt provided [place] sandwich, per her request.  Pt calm and cooperative at this time, mother at bedside.

## 2020-08-16 NOTE — Telephone Encounter (Signed)
Noted  

## 2020-08-16 NOTE — ED Notes (Signed)
Pt taken to private TCU room 32 for TTS assessment.
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
## 2020-08-16 NOTE — Discharge Instructions (Signed)
Continue your current meds.  See your psychiatrist at [facility].  Return to ER if you have thoughts of harming yourself or others or hallucinations

## 2020-08-16 NOTE — Telephone Encounter (Signed)
Mom, Gunnar Fusi, called at 2:32 to report that they are still at [facility] in the ER.  All the x-rays, etc have come back normal.  She is being scheduled for a BH evaluation. Gunnar Fusi is afraid that Joretta Bachelor will manipulate this because she know she can leave if she wants.  Gunnar Fusi is asking that you call her as soon as you're able to give her guidance on what else she can do to be sure Darlin Drop gets the help she needs. [phone]

## 2020-08-16 NOTE — ED Notes (Signed)
1 pt belonging bag, 1 black pair of boots transferred to cupboards labeled pts 23-25 Margo Aye C at [employer] station.

## 2020-08-16 NOTE — ED Notes (Signed)
Pt refusing blood work 

## 2020-08-16 NOTE — ED Notes (Signed)
Pt changed into purple scrubs 
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
## 2020-08-17 ENCOUNTER — Other Ambulatory Visit: Payer: Self-pay | Admitting: Adult Health

## 2020-08-17 ENCOUNTER — Ambulatory Visit: Payer: BC Managed Care – PPO | Admitting: Adult Health

## 2020-08-17 DIAGNOSIS — F428 Other obsessive-compulsive disorder: Secondary | ICD-10-CM

## 2020-08-17 DIAGNOSIS — F411 Generalized anxiety disorder: Secondary | ICD-10-CM

## 2020-08-17 DIAGNOSIS — F331 Major depressive disorder, recurrent, moderate: Secondary | ICD-10-CM

## 2020-08-17 MED ORDER — FLUOXETINE HCL 10 MG PO CAPS: ORAL_CAPSULE | ORAL | 2 refills | Status: DC

## 2020-08-17 NOTE — Telephone Encounter (Signed)
Prozac script sent to pharmacy.
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
## 2020-08-19 ENCOUNTER — Telehealth: Payer: Self-pay | Admitting: Psychiatry

## 2020-08-19 NOTE — Telephone Encounter (Signed)
[employer] after hours phone call from mother Gunnar Fusi at 7:05 AM is returned discussing with patient, mother, and stepfather on speaker phone the patient's anxiety about starting [facility] inpatient tomorrow they consider finalized by Dr. Jennelle Human, separating from 59 and 30-year-old children for the course of treatment, maintaining support and decision making sharing with mother, and dealing with the anticipatory vomiting and diarrhea which have responded to Zofran in the past not currently having any available.  Mother and then patient prefer to proceed to [facility] intake today climbing Zofran and ODT that can be E scribed to their pharmacy rather seeking inpatient stabilization preparing for the programming she hopes to be prepared for tomorrow as patient seeks assurance that her hospitalization will be voluntary and as efficient in duration is possible and as her high anxiety this weekend is again exacerbated by anticipatory overthinking with intense anxiety and despair.  Facilitation of understanding of all for problems at hand clarifies options, problem-solving, and successful approach to treatment, as patient then focuses upon wishing to talk to [facility] but accepts and understands the services they currently require and wish to pursue.  They close the intervention with consensus and preparation as a family.

## 2020-08-20 ENCOUNTER — Emergency Department (HOSPITAL_COMMUNITY): Payer: BC Managed Care – PPO

## 2020-08-20 ENCOUNTER — Inpatient Hospital Stay (HOSPITAL_COMMUNITY)
Admission: EM | Admit: 2020-08-20 | Discharge: 2020-08-26 | DRG: 917 | Disposition: A | Discharge status: Home / Self Care | Payer: BC Managed Care – PPO | Attending: Internal Medicine | Admitting: Internal Medicine

## 2020-08-20 ENCOUNTER — Telehealth: Payer: Self-pay | Admitting: Adult Health

## 2020-08-20 DIAGNOSIS — R52 Pain, unspecified: Secondary | ICD-10-CM | POA: Diagnosis not present

## 2020-08-20 DIAGNOSIS — Z87891 Personal history of nicotine dependence: Secondary | ICD-10-CM

## 2020-08-20 DIAGNOSIS — Z20822 Contact with and (suspected) exposure to covid-19: Secondary | ICD-10-CM | POA: Diagnosis present

## 2020-08-20 DIAGNOSIS — F909 Attention-deficit hyperactivity disorder, unspecified type: Secondary | ICD-10-CM | POA: Diagnosis present

## 2020-08-20 DIAGNOSIS — Z818 Family history of other mental and behavioral disorders: Secondary | ICD-10-CM | POA: Diagnosis not present

## 2020-08-20 DIAGNOSIS — R6520 Severe sepsis without septic shock: Secondary | ICD-10-CM | POA: Diagnosis not present

## 2020-08-20 DIAGNOSIS — A419 Sepsis, unspecified organism: Secondary | ICD-10-CM | POA: Diagnosis not present

## 2020-08-20 DIAGNOSIS — Z79899 Other long term (current) drug therapy: Secondary | ICD-10-CM

## 2020-08-20 DIAGNOSIS — R509 Fever, unspecified: Secondary | ICD-10-CM | POA: Diagnosis not present

## 2020-08-20 DIAGNOSIS — R443 Hallucinations, unspecified: Secondary | ICD-10-CM | POA: Diagnosis present

## 2020-08-20 DIAGNOSIS — Z91040 Latex allergy status: Secondary | ICD-10-CM

## 2020-08-20 DIAGNOSIS — R825 Elevated urine levels of drugs, medicaments and biological substances: Secondary | ICD-10-CM | POA: Diagnosis not present

## 2020-08-20 DIAGNOSIS — F902 Attention-deficit hyperactivity disorder, combined type: Secondary | ICD-10-CM | POA: Diagnosis not present

## 2020-08-20 DIAGNOSIS — F410 Panic disorder [episodic paroxysmal anxiety] without agoraphobia: Secondary | ICD-10-CM | POA: Diagnosis not present

## 2020-08-20 DIAGNOSIS — F419 Anxiety disorder, unspecified: Secondary | ICD-10-CM | POA: Diagnosis not present

## 2020-08-20 DIAGNOSIS — R4182 Altered mental status, unspecified: Secondary | ICD-10-CM | POA: Diagnosis not present

## 2020-08-20 DIAGNOSIS — T43221A Poisoning by selective serotonin reuptake inhibitors, accidental (unintentional), initial encounter: Secondary | ICD-10-CM | POA: Diagnosis not present

## 2020-08-20 DIAGNOSIS — F32A Depression, unspecified: Secondary | ICD-10-CM | POA: Diagnosis present

## 2020-08-20 DIAGNOSIS — R404 Transient alteration of awareness: Secondary | ICD-10-CM | POA: Diagnosis not present

## 2020-08-20 DIAGNOSIS — G9341 Metabolic encephalopathy: Secondary | ICD-10-CM | POA: Diagnosis not present

## 2020-08-20 DIAGNOSIS — F600 Paranoid personality disorder: Secondary | ICD-10-CM | POA: Diagnosis present

## 2020-08-20 DIAGNOSIS — R200 Anesthesia of skin: Secondary | ICD-10-CM | POA: Diagnosis present

## 2020-08-20 DIAGNOSIS — Z83438 Family history of other disorder of lipoprotein metabolism and other lipidemia: Secondary | ICD-10-CM | POA: Diagnosis not present

## 2020-08-20 DIAGNOSIS — G4700 Insomnia, unspecified: Secondary | ICD-10-CM | POA: Diagnosis present

## 2020-08-20 DIAGNOSIS — R9431 Abnormal electrocardiogram [ECG] [EKG]: Secondary | ICD-10-CM | POA: Diagnosis not present

## 2020-08-20 DIAGNOSIS — R159 Full incontinence of feces: Secondary | ICD-10-CM | POA: Diagnosis present

## 2020-08-20 DIAGNOSIS — N390 Urinary tract infection, site not specified: Secondary | ICD-10-CM | POA: Diagnosis present

## 2020-08-20 DIAGNOSIS — G2579 Other drug induced movement disorders: Secondary | ICD-10-CM | POA: Diagnosis not present

## 2020-08-20 DIAGNOSIS — G9340 Encephalopathy, unspecified: Secondary | ICD-10-CM | POA: Diagnosis not present

## 2020-08-20 DIAGNOSIS — Z888 Allergy status to other drugs, medicaments and biological substances status: Secondary | ICD-10-CM

## 2020-08-20 DIAGNOSIS — Z8249 Family history of ischemic heart disease and other diseases of the circulatory system: Secondary | ICD-10-CM | POA: Diagnosis not present

## 2020-08-20 DIAGNOSIS — R410 Disorientation, unspecified: Secondary | ICD-10-CM | POA: Diagnosis not present

## 2020-08-20 DIAGNOSIS — X58XXXA Exposure to other specified factors, initial encounter: Secondary | ICD-10-CM | POA: Diagnosis present

## 2020-08-20 DIAGNOSIS — R569 Unspecified convulsions: Secondary | ICD-10-CM | POA: Diagnosis not present

## 2020-08-20 DIAGNOSIS — R32 Unspecified urinary incontinence: Secondary | ICD-10-CM | POA: Diagnosis present

## 2020-08-20 DIAGNOSIS — F411 Generalized anxiety disorder: Secondary | ICD-10-CM | POA: Diagnosis not present

## 2020-08-20 DIAGNOSIS — F29 Unspecified psychosis not due to a substance or known physiological condition: Secondary | ICD-10-CM | POA: Diagnosis not present

## 2020-08-20 DIAGNOSIS — F1910 Other psychoactive substance abuse, uncomplicated: Secondary | ICD-10-CM | POA: Diagnosis present

## 2020-08-20 DIAGNOSIS — M546 Pain in thoracic spine: Secondary | ICD-10-CM | POA: Diagnosis not present

## 2020-08-20 DIAGNOSIS — F23 Brief psychotic disorder: Secondary | ICD-10-CM | POA: Diagnosis present

## 2020-08-20 DIAGNOSIS — G959 Disease of spinal cord, unspecified: Secondary | ICD-10-CM | POA: Diagnosis not present

## 2020-08-20 DIAGNOSIS — R29818 Other symptoms and signs involving the nervous system: Secondary | ICD-10-CM | POA: Diagnosis not present

## 2020-08-20 DIAGNOSIS — M545 Low back pain, unspecified: Secondary | ICD-10-CM | POA: Diagnosis not present

## 2020-08-20 DIAGNOSIS — R7989 Other specified abnormal findings of blood chemistry: Secondary | ICD-10-CM | POA: Diagnosis not present

## 2020-08-20 LAB — URINALYSIS, ROUTINE W REFLEX MICROSCOPIC
Bilirubin Urine: NEGATIVE
Glucose, UA: NEGATIVE mg/dL
Hgb urine dipstick: NEGATIVE
Ketones, ur: NEGATIVE mg/dL
Nitrite: NEGATIVE
Protein, ur: 100 mg/dL — AB
Specific Gravity, Urine: 1.021 (ref 1.005–1.030)
pH: 5 (ref 5.0–8.0)

## 2020-08-20 LAB — HCG, QUANTITATIVE, PREGNANCY: hCG, Beta Chain, Quant, S: <1 m[IU]/mL (ref <5)

## 2020-08-20 LAB — SALICYLATE LEVEL: Salicylate Lvl: <7 mg/dL — ABNORMAL LOW (ref 7.0–30.0)

## 2020-08-20 LAB — RESP PANEL BY RT-PCR (FLU A&B, COVID) ARPGX2
Influenza A by PCR: NEGATIVE
Influenza B by PCR: NEGATIVE
SARS Coronavirus 2 by RT PCR: NEGATIVE

## 2020-08-20 LAB — ACETAMINOPHEN LEVEL: Acetaminophen (Tylenol), Serum: <10 ug/mL — ABNORMAL LOW (ref 10–30)

## 2020-08-20 IMAGING — DX DG CHEST 1V PORT
1 series · 1 of 1 positions shown · non-contrast
Comparison: [DATE]

CLINICAL DATA: Fever

EXAM:
PORTABLE CHEST 1 VIEW

[chest ap]
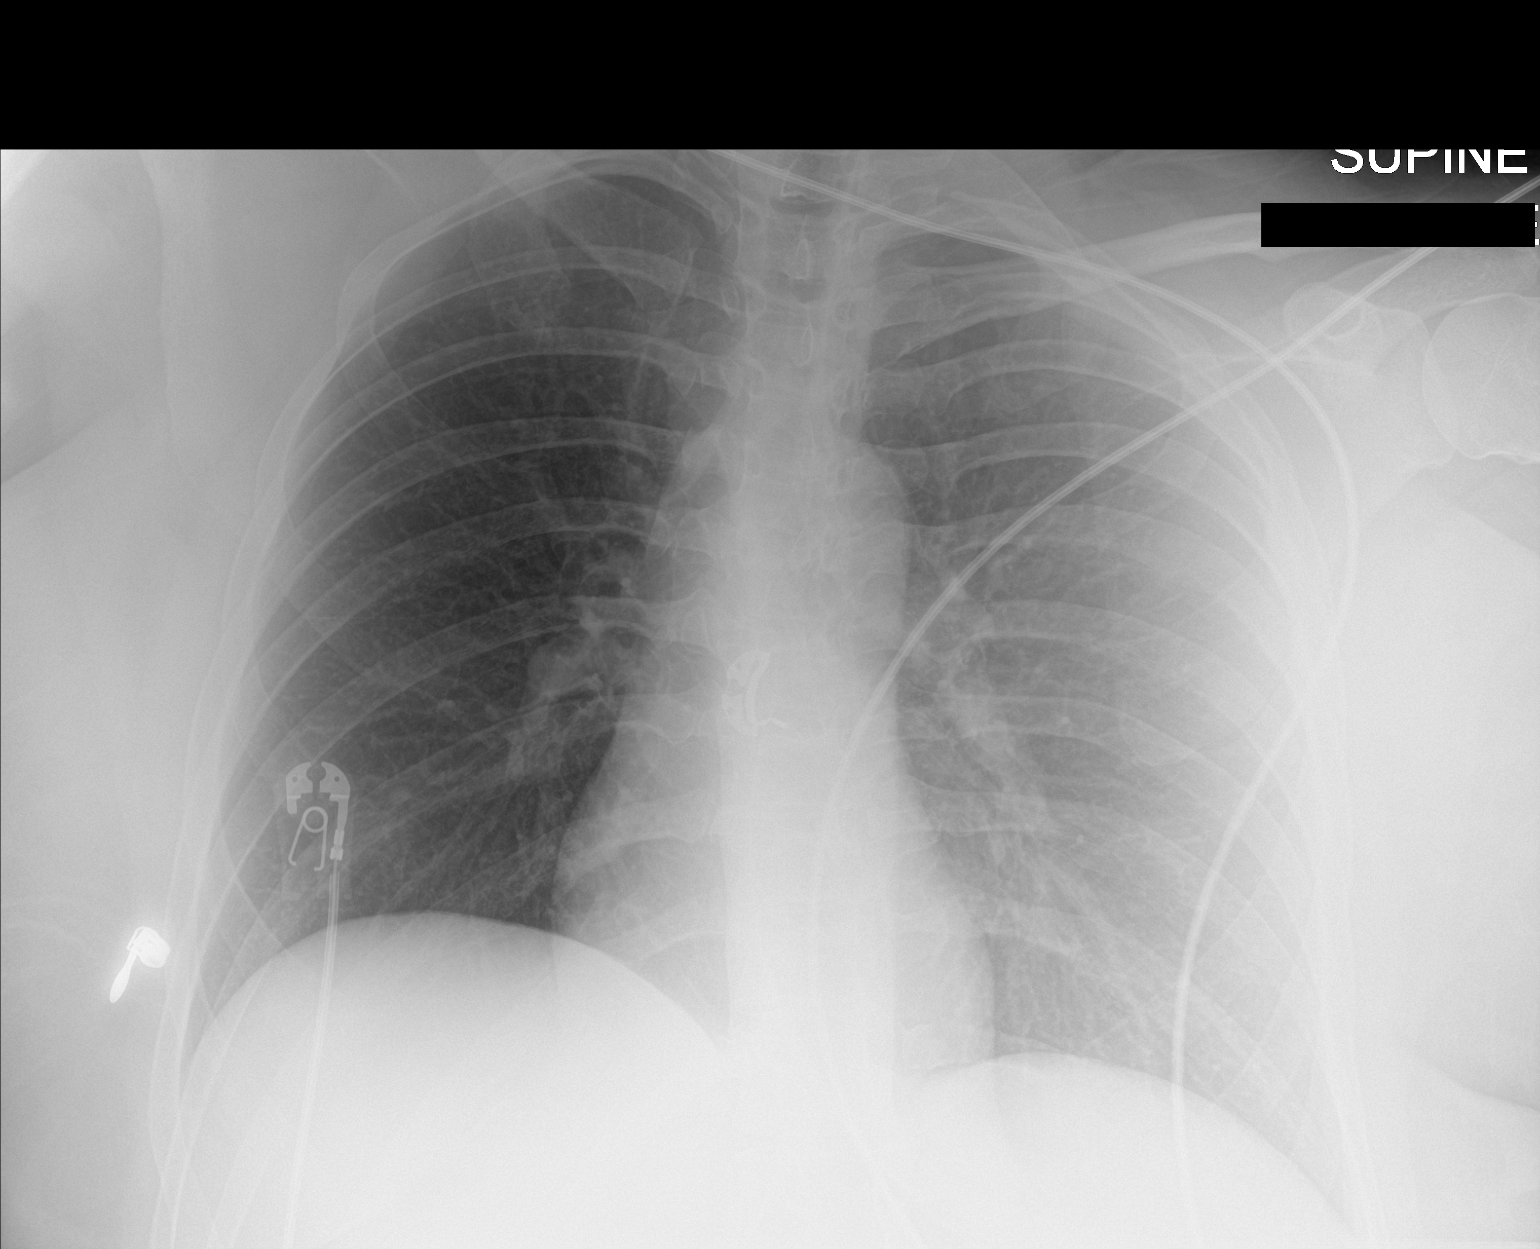

[1 of 1 positions shown; findings below may reference images not displayed]

FINDINGS: Asymmetric hazy opacity left thorax likely due to rotation and
overlying soft tissues. No consolidation or effusion. Normal heart
size. No pneumothorax.
IMPRESSION: No active disease.

## 2020-08-20 MED ORDER — ACETAMINOPHEN 500 MG PO TABS
1000.0000 mg | ORAL_TABLET | Freq: Once | ORAL | Status: DC
  Filled 2020-08-20: qty 2

## 2020-08-20 MED ORDER — VANCOMYCIN HCL IN DEXTROSE 1-5 GM/200ML-% IV SOLN: 1000.0000 mg | Freq: Once | INTRAVENOUS | Status: DC

## 2020-08-20 MED ORDER — SULFAMETHOXAZOLE-TRIMETHOPRIM 400-80 MG/5ML IV SOLN: 400.0000 mg | Freq: Once | INTRAVENOUS | Status: DC

## 2020-08-20 MED ORDER — ACETAMINOPHEN 650 MG RE SUPP
650.0000 mg | Freq: Once | RECTAL | Status: AC
  Administered 2020-08-20: 650 mg via RECTAL
  Filled 2020-08-20: qty 1

## 2020-08-20 NOTE — Telephone Encounter (Signed)
Will call mother

## 2020-08-20 NOTE — Telephone Encounter (Signed)
Pt mother called and said that [facility] did not admit [facility]. Pt's mother would like someone to call Baxter Hire at [facility] and get updated information. [place] signed a release of information. Please call.

## 2020-08-20 NOTE — Telephone Encounter (Signed)
Did she leave a number? 
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
## 2020-08-20 NOTE — Telephone Encounter (Signed)
Noted  

## 2020-08-20 NOTE — Telephone Encounter (Signed)
Call received from mother requesting a call back. Call to mother initiated. Mother expressing concerns daughter was not admitted to [facility] this morning. Was taken to [facility] after having a seizure while at [facility]. Per mother, she is being discharged. Mother concerned that she is not being honest about her current issues. Called patient to discuss. Patient answered phone call and notes she is being discharged from [facility] and being taken to [facility] for admission. Will call with updates.

## 2020-08-20 NOTE — Telephone Encounter (Signed)
Delice Bison reports Baxter Hire as the Sports coach that had the release of information at [employer]. 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 Contact [phone]

## 2020-08-20 NOTE — ED Notes (Signed)
Pt urinated all over the bed and herself.  Pt stated she needed to void and a bedpan was grabbed from the cabinet.  Pt urinated before bedpan could be placed under her.  Pt writhing in bed and states that she doesn't know what is wrong.

## 2020-08-20 NOTE — Telephone Encounter (Signed)
Did they leave a number to call? Who is Radiographer, therapeutic at [employer]?

## 2020-08-20 NOTE — Telephone Encounter (Signed)
Mom just called.  [name] [name] was at [facility] this morning and she had a seizure and they called 9l1.  She is presently at [facility] and they are getting ready to discharge her.  [name] [name] doesn't know where to take [name] [name].  Everyone just keeps discharging her.  [name] [name] is concerned about this seizure.  Could it be Serotonin Syndrome.  She has the symptoms.  It is important [name] [name] get a return call ASAP so she know what to do about medications and what to do about [name] [name] now that she is being discharged yet again.  She can't get any help.

## 2020-08-21 ENCOUNTER — Encounter (HOSPITAL_COMMUNITY): Payer: Self-pay | Admitting: Internal Medicine

## 2020-08-21 ENCOUNTER — Ambulatory Visit: Payer: BC Managed Care – PPO | Admitting: Adult Health

## 2020-08-21 ENCOUNTER — Telehealth: Payer: Self-pay | Admitting: Adult Health

## 2020-08-21 ENCOUNTER — Inpatient Hospital Stay (HOSPITAL_COMMUNITY): Payer: BC Managed Care – PPO

## 2020-08-21 ENCOUNTER — Other Ambulatory Visit: Payer: Self-pay

## 2020-08-21 DIAGNOSIS — R443 Hallucinations, unspecified: Secondary | ICD-10-CM | POA: Diagnosis present

## 2020-08-21 DIAGNOSIS — R6520 Severe sepsis without septic shock: Secondary | ICD-10-CM | POA: Diagnosis not present

## 2020-08-21 DIAGNOSIS — A419 Sepsis, unspecified organism: Secondary | ICD-10-CM | POA: Diagnosis present

## 2020-08-21 DIAGNOSIS — F411 Generalized anxiety disorder: Secondary | ICD-10-CM

## 2020-08-21 DIAGNOSIS — R32 Unspecified urinary incontinence: Secondary | ICD-10-CM | POA: Diagnosis present

## 2020-08-21 DIAGNOSIS — N390 Urinary tract infection, site not specified: Secondary | ICD-10-CM | POA: Diagnosis present

## 2020-08-21 DIAGNOSIS — R200 Anesthesia of skin: Secondary | ICD-10-CM | POA: Diagnosis present

## 2020-08-21 DIAGNOSIS — G9340 Encephalopathy, unspecified: Secondary | ICD-10-CM

## 2020-08-21 DIAGNOSIS — Z87891 Personal history of nicotine dependence: Secondary | ICD-10-CM | POA: Diagnosis not present

## 2020-08-21 DIAGNOSIS — F909 Attention-deficit hyperactivity disorder, unspecified type: Secondary | ICD-10-CM | POA: Diagnosis present

## 2020-08-21 DIAGNOSIS — F902 Attention-deficit hyperactivity disorder, combined type: Secondary | ICD-10-CM

## 2020-08-21 DIAGNOSIS — Z888 Allergy status to other drugs, medicaments and biological substances status: Secondary | ICD-10-CM | POA: Diagnosis not present

## 2020-08-21 DIAGNOSIS — Z91040 Latex allergy status: Secondary | ICD-10-CM | POA: Diagnosis not present

## 2020-08-21 DIAGNOSIS — Z20822 Contact with and (suspected) exposure to covid-19: Secondary | ICD-10-CM | POA: Diagnosis present

## 2020-08-21 DIAGNOSIS — F1910 Other psychoactive substance abuse, uncomplicated: Secondary | ICD-10-CM | POA: Diagnosis present

## 2020-08-21 DIAGNOSIS — Z79899 Other long term (current) drug therapy: Secondary | ICD-10-CM | POA: Diagnosis not present

## 2020-08-21 DIAGNOSIS — Z818 Family history of other mental and behavioral disorders: Secondary | ICD-10-CM | POA: Diagnosis not present

## 2020-08-21 DIAGNOSIS — R509 Fever, unspecified: Secondary | ICD-10-CM | POA: Diagnosis not present

## 2020-08-21 DIAGNOSIS — F23 Brief psychotic disorder: Secondary | ICD-10-CM | POA: Diagnosis not present

## 2020-08-21 DIAGNOSIS — G2579 Other drug induced movement disorders: Secondary | ICD-10-CM | POA: Diagnosis not present

## 2020-08-21 DIAGNOSIS — G4700 Insomnia, unspecified: Secondary | ICD-10-CM | POA: Diagnosis present

## 2020-08-21 DIAGNOSIS — F32A Depression, unspecified: Secondary | ICD-10-CM | POA: Diagnosis present

## 2020-08-21 DIAGNOSIS — F410 Panic disorder [episodic paroxysmal anxiety] without agoraphobia: Secondary | ICD-10-CM | POA: Diagnosis present

## 2020-08-21 DIAGNOSIS — G9341 Metabolic encephalopathy: Secondary | ICD-10-CM | POA: Diagnosis present

## 2020-08-21 DIAGNOSIS — R159 Full incontinence of feces: Secondary | ICD-10-CM | POA: Diagnosis present

## 2020-08-21 DIAGNOSIS — R4182 Altered mental status, unspecified: Secondary | ICD-10-CM | POA: Diagnosis not present

## 2020-08-21 DIAGNOSIS — X58XXXA Exposure to other specified factors, initial encounter: Secondary | ICD-10-CM | POA: Diagnosis present

## 2020-08-21 DIAGNOSIS — R569 Unspecified convulsions: Secondary | ICD-10-CM | POA: Diagnosis present

## 2020-08-21 DIAGNOSIS — F600 Paranoid personality disorder: Secondary | ICD-10-CM | POA: Diagnosis present

## 2020-08-21 DIAGNOSIS — Z8249 Family history of ischemic heart disease and other diseases of the circulatory system: Secondary | ICD-10-CM | POA: Diagnosis not present

## 2020-08-21 DIAGNOSIS — T43221A Poisoning by selective serotonin reuptake inhibitors, accidental (unintentional), initial encounter: Secondary | ICD-10-CM | POA: Diagnosis present

## 2020-08-21 DIAGNOSIS — Z83438 Family history of other disorder of lipoprotein metabolism and other lipidemia: Secondary | ICD-10-CM | POA: Diagnosis not present

## 2020-08-21 LAB — COMPREHENSIVE METABOLIC PANEL
ALT: 26 U/L (ref 0–44)
AST: 30 U/L (ref 15–41)
Albumin: 4.7 g/dL (ref 3.5–5.0)
Alkaline Phosphatase: 96 U/L (ref 38–126)
Anion gap: 11 (ref 5–15)
BUN: 13 mg/dL (ref 6–20)
CO2: 25 mmol/L (ref 22–32)
Calcium: 9.3 mg/dL (ref 8.9–10.3)
Chloride: 105 mmol/L (ref 98–111)
Creatinine, Ser: 1.01 mg/dL — ABNORMAL HIGH (ref 0.44–1.00)
GFR, Estimated: >60 mL/min (ref >60)
Glucose, Bld: 151 mg/dL — ABNORMAL HIGH (ref 70–99)
Potassium: 3.5 mmol/L (ref 3.5–5.1)
Sodium: 141 mmol/L (ref 135–145)
Total Bilirubin: 1.3 mg/dL — ABNORMAL HIGH (ref 0.3–1.2)
Total Protein: 8.4 g/dL — ABNORMAL HIGH (ref 6.5–8.1)

## 2020-08-21 LAB — LACTIC ACID, PLASMA
Lactic Acid, Venous: 8.2 mmol/L (ref 0.5–1.9)
Lactic Acid, Venous: 2.4 mmol/L (ref 0.5–1.9)
Lactic Acid, Venous: 1.5 mmol/L (ref 0.5–1.9)

## 2020-08-21 LAB — PROTEIN AND GLUCOSE, CSF
Glucose, CSF: 103 mg/dL — ABNORMAL HIGH (ref 40–70)
Total  Protein, CSF: 28 mg/dL (ref 15–45)

## 2020-08-21 LAB — PROCALCITONIN: Procalcitonin: <0.1 ng/mL

## 2020-08-21 LAB — CBC
HCT: 41 % (ref 36.0–46.0)
Hemoglobin: 13.7 g/dL (ref 12.0–15.0)
MCH: 30 pg (ref 26.0–34.0)
MCHC: 33.4 g/dL (ref 30.0–36.0)
MCV: 89.7 fL (ref 80.0–100.0)
Platelets: 381 10*3/uL (ref 150–400)
RBC: 4.57 MIL/uL (ref 3.87–5.11)
RDW: 12.7 % (ref 11.5–15.5)
WBC: 21 10*3/uL — ABNORMAL HIGH (ref 4.0–10.5)
nRBC: 0 % (ref 0.0–0.2)

## 2020-08-21 LAB — PROTIME-INR
INR: 1.2 (ref 0.8–1.2)
Prothrombin Time: 14.8 seconds (ref 11.4–15.2)

## 2020-08-21 LAB — CSF CELL COUNT WITH DIFFERENTIAL
RBC Count, CSF: 0 /mm3
Tube #: 4
WBC, CSF: 2 /mm3 (ref 0–5)

## 2020-08-21 LAB — HIV ANTIBODY (ROUTINE TESTING W REFLEX): HIV Screen 4th Generation wRfx: NONREACTIVE

## 2020-08-21 LAB — CK: Total CK: 485 U/L — ABNORMAL HIGH (ref 38–234)

## 2020-08-21 LAB — CORTISOL-AM, BLOOD: Cortisol - AM: 59.7 ug/dL — ABNORMAL HIGH (ref 6.7–22.6)
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
## 2020-08-21 IMAGING — RF DG FLUORO GUIDE SPINAL/SI JT INJ*L*
3 series · 3 of 3 positions shown · non-contrast
Comparison: none

CLINICAL DATA: 29-year-old female with history of fever and altered
mental status.

[Series 1: cp_standard · 0.25mm/px · 1 of 1 slices shown (1 of 3)]
[im 1/1]
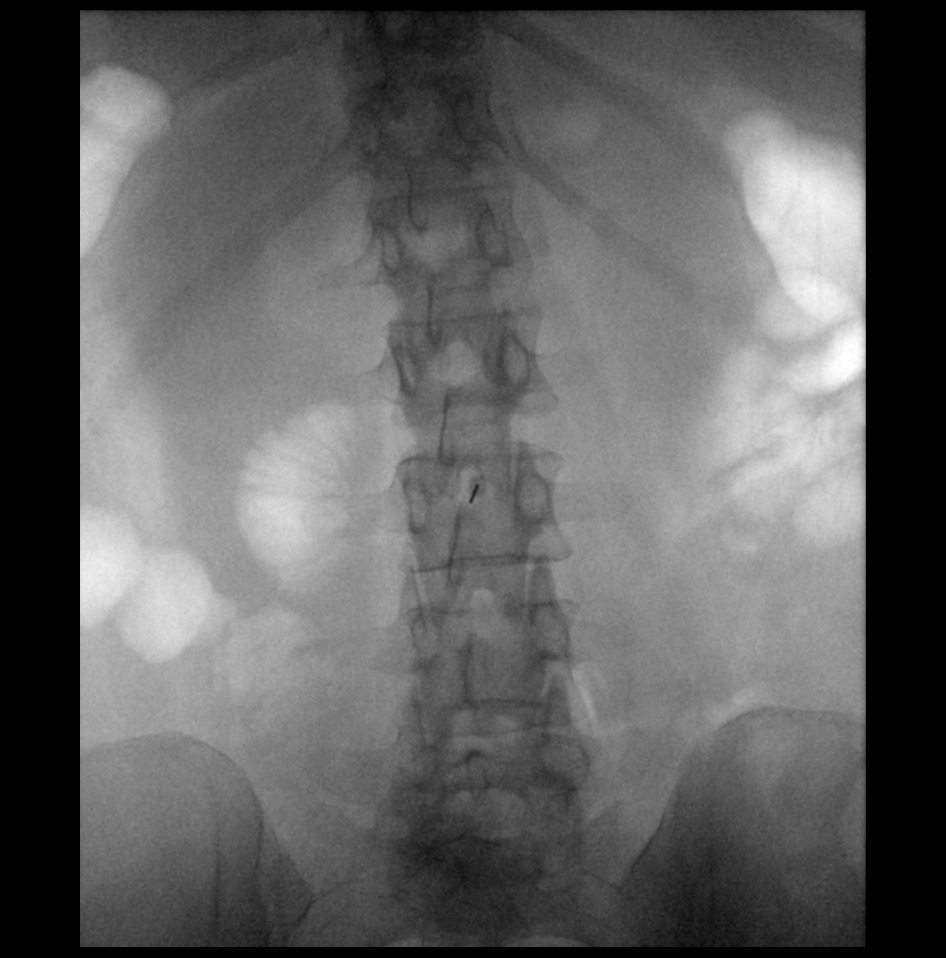

[Series 2: cp_standard · 0.25mm/px · 1 of 1 slices shown (2 of 3)]
[im 1/1]
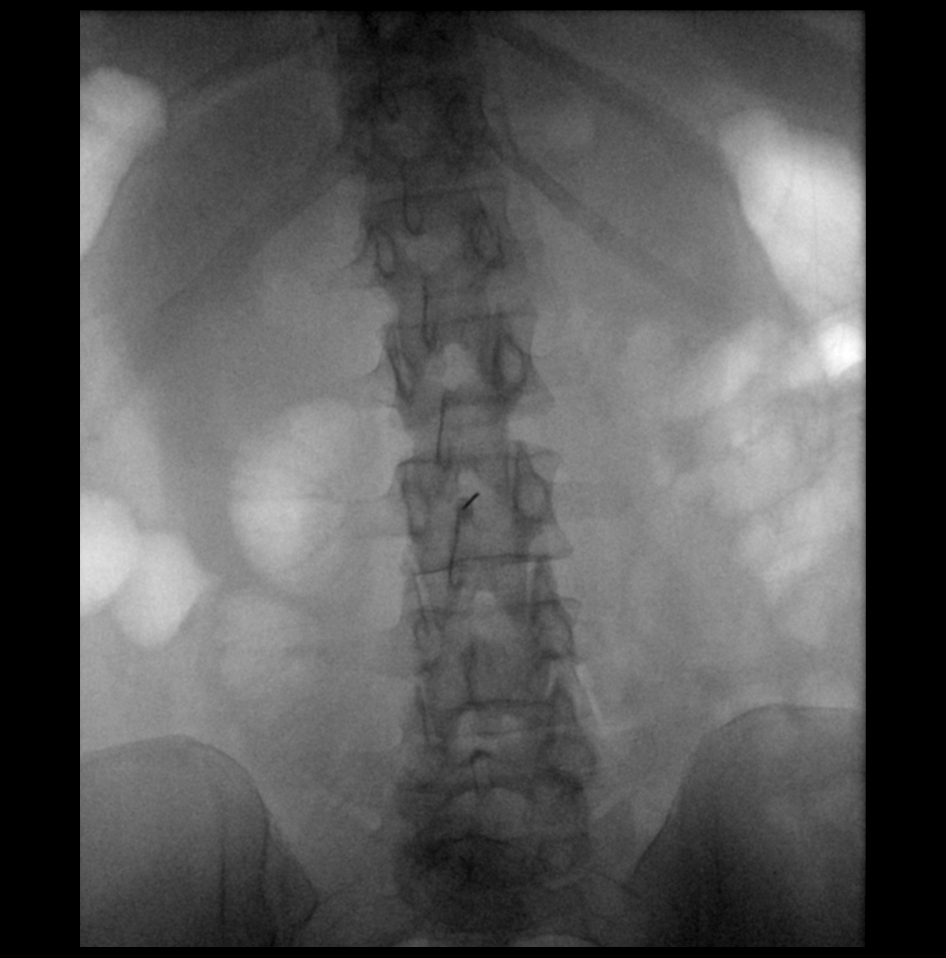

[Series 3: cp_standard · 0.25mm/px · 1 of 1 slices shown (3 of 3)]
[im 1/1]
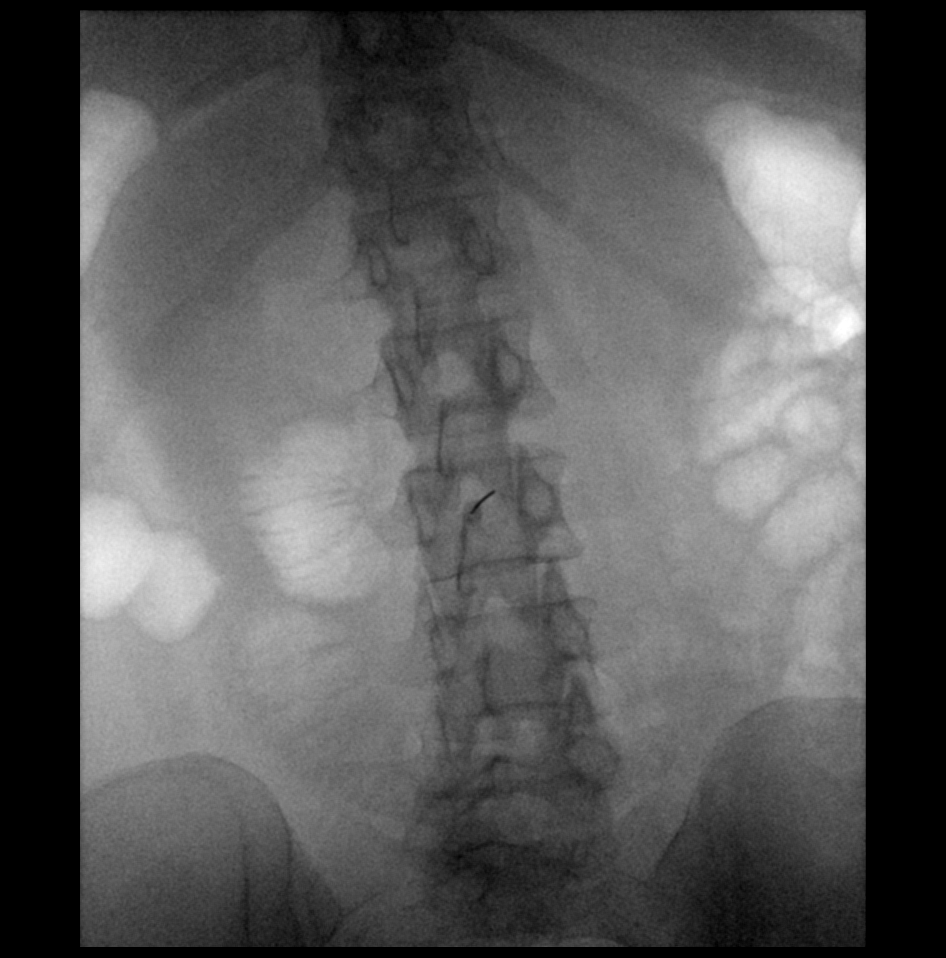

[3 of 3 positions shown; findings below may reference images not displayed]

EXAM:
DIAGNOSTIC LUMBAR PUNCTURE UNDER FLUOROSCOPIC GUIDANCE

FLUOROSCOPY TIME:  Fluoroscopy Time:  30 seconds

Radiation Exposure Index (if provided by the fluoroscopic device):
2.8 mGy

PROCEDURE:
Informed consent was obtained from the patient prior to the
procedure, including potential complications of headache, allergy,
and pain. With the patient prone, the lower back was prepped with
Betadine. 1% Lidocaine was used for local anesthesia. Lumbar
puncture was performed at the L2-L3 level using a 20 gauge needle
with return of clear CSF with an opening pressure of 12 cm water. 10
ml of CSF were obtained for laboratory studies. The patient
tolerated the procedure well and there were no apparent
complications.
IMPRESSION: 1. Successful uncomplicated fluoroscopic guided lumbar puncture at
L2-L3, as above.

## 2020-08-21 MED ORDER — VANCOMYCIN HCL 2000 MG/400ML IV SOLN
2000.0000 mg | Freq: Once | INTRAVENOUS | Status: DC
  Filled 2020-08-21: qty 400

## 2020-08-21 MED ORDER — DEXAMETHASONE SODIUM PHOSPHATE 10 MG/ML IJ SOLN
10.0000 mg | Freq: Once | INTRAMUSCULAR | Status: AC
  Administered 2020-08-21: 10 mg via INTRAVENOUS
  Filled 2020-08-21: qty 1

## 2020-08-21 MED ORDER — DEXTROSE IN LACTATED RINGERS 5 % IV SOLN: INTRAVENOUS | Status: DC

## 2020-08-21 MED ORDER — SODIUM CHLORIDE 0.9 % IV SOLN
2.0000 g | Freq: Two times a day (BID) | INTRAVENOUS | Status: DC
  Administered 2020-08-21 – 2020-08-22 (×4): 2 g via INTRAVENOUS
  Filled 2020-08-21 (×2): qty 2
  Filled 2020-08-21 (×2): qty 20

## 2020-08-21 MED ORDER — ONDANSETRON HCL 4 MG/2ML IJ SOLN
4.0000 mg | Freq: Four times a day (QID) | INTRAMUSCULAR | Status: DC | PRN
  Administered 2020-08-22 – 2020-08-25 (×2): 4 mg via INTRAVENOUS
  Filled 2020-08-21 (×2): qty 2

## 2020-08-21 MED ORDER — LORAZEPAM 2 MG/ML IJ SOLN
1.0000 mg | INTRAMUSCULAR | Status: DC | PRN
  Administered 2020-08-21 – 2020-08-26 (×24): 1 mg via INTRAVENOUS
  Filled 2020-08-21 (×24): qty 1

## 2020-08-21 MED ORDER — VANCOMYCIN HCL IN DEXTROSE 1-5 GM/200ML-% IV SOLN
1000.0000 mg | Freq: Three times a day (TID) | INTRAVENOUS | Status: DC
  Administered 2020-08-21 – 2020-08-22 (×3): 1000 mg via INTRAVENOUS
  Filled 2020-08-21 (×3): qty 200

## 2020-08-21 MED ORDER — SODIUM CHLORIDE 0.9 % IV SOLN
1.0000 g | Freq: Once | INTRAVENOUS | Status: AC
  Administered 2020-08-21: 1 g via INTRAVENOUS
  Filled 2020-08-21: qty 1

## 2020-08-21 MED ORDER — TRAMADOL HCL 50 MG PO TABS
50.0000 mg | ORAL_TABLET | Freq: Two times a day (BID) | ORAL | Status: DC | PRN
  Administered 2020-08-21: 50 mg via ORAL
  Filled 2020-08-21: qty 1

## 2020-08-21 MED ORDER — ONDANSETRON HCL 4 MG PO TABS: 4.0000 mg | ORAL_TABLET | Freq: Four times a day (QID) | ORAL | Status: DC | PRN

## 2020-08-21 MED ORDER — VANCOMYCIN HCL IN DEXTROSE 1-5 GM/200ML-% IV SOLN
1000.0000 mg | Freq: Once | INTRAVENOUS | Status: AC
  Administered 2020-08-21: 1000 mg via INTRAVENOUS

## 2020-08-21 NOTE — ED Notes (Signed)
Pt ambulatory to bathroom

## 2020-08-21 NOTE — Plan of Care (Signed)

## 2020-08-21 NOTE — ED Notes (Addendum)
Date and time results received: 08/21/20 0207  Test: Lactic Acid  Critical Value: 8.2  Name of Provider Notified: Mikeal Hawthorne, MD; Katherina Right, [place]

## 2020-08-21 NOTE — ED Notes (Signed)
Pt removed IV while flailing in bed.  Pressure applied and new IV started.  Vital signs stable will continue to monitor.

## 2020-08-21 NOTE — Telephone Encounter (Signed)
Gunnar Fusi called and said that she Melah has had 40 seizures since yesterday and she would like to talk to you about coordination of care for Levern. Please call Gunnar Fusi at [phone]. Dr. Jennelle Human never did call her yesterday

## 2020-08-21 NOTE — Procedures (Signed)
Lumbar puncture performed at L2-L3.  Opening pressure 12 cm H2O.  10 mL clear CSF obtained.  Please see dictation in PACS for full details.

## 2020-08-21 NOTE — ED Notes (Signed)
Pt given PRN Ativan for agitation due to continued flailing and restlessness.  Pt resting quietly in bed at this time.  Vital signs stable at this time.  Will continue to monitor.
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
## 2020-08-21 NOTE — ED Notes (Signed)
Mother at bedside.

## 2020-08-21 NOTE — ED Notes (Signed)
Patient off the floor for procedure.

## 2020-08-21 NOTE — ED Notes (Signed)
Pt acting erratic in room.  Pt able to respond to questions appropriately, but is flailing violently when staff is in the room.  Pt also continues to void on herself.  Vital signs remain stable.

## 2020-08-21 NOTE — ED Notes (Signed)
Patient is to remain flat at this time from procedure

## 2020-08-21 NOTE — Telephone Encounter (Signed)
Noted  

## 2020-08-21 NOTE — Progress Notes (Signed)
Pharmacy Antibiotic Note  [name] [name] is a 29 y.o. female admitted on 08/20/2020 with meningitis.  Pharmacy has been consulted for Vancomycin dosing.  Rocephin per MD- noted patient has an allergy listed to Ceclor of mild rash at age 3.  Since this was not a life threatening reaction that occurred >10 years ago agree with Rocephin (1st line therapy for meningitis).  Upon chart review she did tolerate cefotetan in 2017 and has tolerated multiple courses of Augmentin.   Plan: Rocephin per MD- monitor closely for s/sx of allergic reaction Vancomycin 1gm IV q8h to target Vanc trough 15-2mcg/ml Check Vancomycin levels at steady state Monitor renal function and cx data   Height: 5\' 8"  (172.7 cm) Weight: 90.7 kg (200 lb) IBW/kg (Calculated) : 63.9  Temp (24hrs), Avg:99.6 F (37.6 C), Min:97.8 F (36.6 C), Max:101.4 F (38.6 C)  Recent Labs  Lab 08/16/20 1723  WBC 11.4*  CREATININE 0.83    Estimated Creatinine Clearance: 116.7 mL/min (by C-G formula based on SCr of 0.83 mg/dL).    Allergies  Allergen Reactions  . Ceclor [Cefaclor] Hives    Age 24 mild rash  . Latex Other (See Comments)    Irritation on skin    Antimicrobials this admission: 12/7 Vancomycin >>  12/7 Meropenem x1 in ED 12/7 Rocephin  Dose adjustments this admission:  Microbiology results: 12/7 BCx:  12/7 CSF cx: 12/7 HSV PCR: 12/6 Resp PCR: negative for COVID & influenza   Thank you for allowing pharmacy to be a part of this patient's care.  14/6 PharmD, BCPS 08/21/2020 1:04 AM

## 2020-08-21 NOTE — H&P (Addendum)
History and Physical   [name]:[id number] DOB: 06/10/1991 DOA: 08/20/2020  Referring MD/NP/PA: Dr Melina Copa  PCP: Patient, No Pcp Per   Outpatient Specialists: [facility], [place], [place]   Patient coming from: Home  Chief Complaint: fever, Confusion  HPI: [name] [name] is a 29 y.o. female with medical history significant of ADHD, and panic attacks who presented with confusion and agitation abnormal twitching and spasms as well as fever.  Patient was apparently at an outside facility at [facility] today.  She did after she went to [facility] to be treated for her anxiety and depression and had a seizure.  She was noted to have a fever.  She was also more confused.  Initially there was concern for possible serotonin syndrome.  Patient has recently stopped her Paxil but has restarted it.  She is also on Adderall and she has been taking all her medications.  She was discharged home but patient came in here with significant fever more confusion and more twitchiness.  Suspicion for encephalitis versus meningitis especially aseptic meningitis was done.  Attempted lumbar puncture in the ER has failed.  Patient has met sepsis criteria with temperature heart rate and pulse.  She also has leukocytosis.  She has endorgan involvement with her confusion.  She is being admitted with sepsis probably secondary to neurologic source.  She also has a Administrator..  ED Course: Temperature 101.4 blood pressure 167/109 pulse 108 respirate of 36 oxygen sat 91% on room air.  Urinalysis showed cloudy urine with mild leukocytes WBC 11-20 with rare bacteria.  Tylenol and salicylate levels were low COVID-19 and respiratory viruses were negative.  Chemistry within normal.  White count 11.4.  Head CT without contrast showed no acute findings.  Attempted lumbar puncture failed.  Patient being admitted for evaluation and treatment of possible meningitis or encephalitis with sepsis.  Review of Systems: As per HPI  otherwise 10 point review of systems negative.    Past Medical History:  Diagnosis Date  . ADHD (attention deficit hyperactivity disorder)   . Anxiety   . Panic attacks   . Vaginal Pap smear, abnormal     Past Surgical History:  Procedure Laterality Date  . DILATION AND CURETTAGE OF UTERUS  01/04/16  . DILATION AND EVACUATION N/A 01/04/2016   Procedure: DILATATION AND EVACUATION;  Surgeon: Louretta Shorten, MD;  Location: [facility];  Service: Gynecology;  Laterality: N/A;  . GUM SURGERY  01/2017  . INDUCED ABORTION    . wisdom teeth removal Bilateral    2010     reports that she quit smoking about 7 years ago. Her smoking use included cigarettes. She has never used smokeless tobacco. She reports that she does not drink alcohol and does not use drugs.  Allergies  Allergen Reactions  . Ceclor [Cefaclor] Hives    Age 22 mild rash  . Latex Other (See Comments)    Irritation on skin    Family History  Problem Relation Age of Onset  . Hypertension Mother   . Heart disease Father   . Cancer Father        testicular  . Hypertension Father   . Hyperlipidemia Father   . Heart disease Paternal Grandfather      Prior to Admission medications   Medication Sig Start Date End Date Taking? Authorizing Provider  albuterol (VENTOLIN HFA) 108 (90 Base) MCG/ACT inhaler Inhale 1-2 puffs into the lungs every 6 (six) hours as needed for wheezing or shortness of  breath. 01/03/20   Maximiano Coss, NP  amoxicillin (AMOXIL) 500 MG capsule Take 500 mg by mouth 3 (three) times daily. Start date : 07/31/20 07/31/20   [provider]  amphetamine-dextroamphetamine (ADDERALL) 20 MG tablet TAKE 1 TABLET BY MOUTH TWICE DAILY. Patient taking differently: Take 20 mg by mouth 2 (two) times daily.  07/26/20   Mozingo, Berdie Ogren, NP  azithromycin (ZITHROMAX) 250 MG tablet Take 2 tabs on first day. Then take 1 tab daily. Finish entire supply. Patient not taking: Reported on 08/16/2020 01/03/20   Maximiano Coss, NP  FLUoxetine (PROZAC) 10 MG capsule Take three capsules daily. 08/17/20   Mozingo, Berdie Ogren, NP  FLUoxetine (PROZAC) 40 MG capsule Take one capsule daily. Patient not taking: Reported on 08/16/2020 02/01/20   Mozingo, Berdie Ogren, NP  fluticasone (FLONASE) 50 MCG/ACT nasal spray USE 1 SPRAY IN EACH NOSTRIL TWICE A DAY. Patient not taking: Reported on 08/16/2020 07/25/20   Maximiano Coss, NP  guaiFENesin-dextromethorphan [facility] DM) 100-10 MG/5ML syrup Take 5 mLs by mouth every 4 (four) hours as needed for cough. Patient not taking: Reported on 08/16/2020 01/03/20   Maximiano Coss, NP  methylPREDNISolone (MEDROL DOSEPAK) 4 MG TBPK tablet Take 4 mg by mouth See admin instructions. Take as directed on dose pack. 07/31/20   [provider]  montelukast (SINGULAIR) 10 MG tablet Take 1 tablet (10 mg total) by mouth at bedtime. Patient not taking: Reported on 08/16/2020 01/03/20   Maximiano Coss, NP  predniSONE (STERAPRED UNI-PAK 21 TAB) 10 MG (21) TBPK tablet Take per blister pack instructions. Do not skip doses. Finish entire pack. Patient not taking: Reported on 08/16/2020 01/03/20   Maximiano Coss, NP    Physical Exam: Vitals:   08/20/20 2131 08/20/20 2145 08/20/20 2230 08/20/20 2300  BP:  (!) 143/87 120/90 (!) 155/127  Pulse:  73 78 97  Resp:  (!) 36    Temp: (!) 101.4 F (38.6 C)     TempSrc: Rectal     SpO2:  91% 94% 96%  Weight:      Height:          Constitutional: Acutely ill looking, withdrawn, agitated occasionally Vitals:   08/20/20 2131 08/20/20 2145 08/20/20 2230 08/20/20 2300  BP:  (!) 143/87 120/90 (!) 155/127  Pulse:  73 78 97  Resp:  (!) 36    Temp: (!) 101.4 F (38.6 C)     TempSrc: Rectal     SpO2:  91% 94% 96%  Weight:      Height:       Eyes: PERRL, lids and conjunctivae normal ENMT: Mucous membranes are dry. Posterior pharynx clear of any exudate or lesions.Normal dentition.  Neck: normal, supple, no masses, no  thyromegaly Respiratory: clear to auscultation bilaterally, no wheezing, no crackles. Normal respiratory effort. No accessory muscle use.  Cardiovascular: Sinus tachycardia, no murmurs / rubs / gallops. No extremity edema. 2+ pedal pulses. No carotid bruits.  Abdomen: no tenderness, no masses palpated. No hepatosplenomegaly. Bowel sounds positive.  Musculoskeletal: no clubbing / cyanosis. No joint deformity upper and lower extremities. Good ROM, no contractures. Normal muscle tone.  Skin: no rashes, lesions, ulcers. No induration Neurologic: CN 2-12 grossly intact. Sensation intact, DTR normal. Strength 5/5 in all 4.  Occasional spasms Psychiatric: Confused, decreased sensorium, not communicating well.    Labs on Admission: I have personally reviewed following labs and imaging studies  CBC: Recent Labs  Lab 08/16/20 1723  WBC 11.4*  NEUTROABS 9.0*  HGB 13.7  HCT 42.2  MCV 91.1  PLT 390   Basic Metabolic Panel: Recent Labs  Lab 08/16/20 1723  NA 137  K 4.3  CL 103  CO2 25  GLUCOSE 113*  BUN 9  CREATININE 0.83  CALCIUM 9.4   GFR: Estimated Creatinine Clearance: 116.7 mL/min (by C-G formula based on SCr of 0.83 mg/dL). Liver Function Tests: Recent Labs  Lab 08/16/20 1723  AST 23  ALT 38  ALKPHOS 91  BILITOT 1.0  PROT 7.7  ALBUMIN 4.2   No results for input(s): LIPASE, AMYLASE in the last 168 hours. No results for input(s): AMMONIA in the last 168 hours. Coagulation Profile: No results for input(s): INR, PROTIME in the last 168 hours. Cardiac Enzymes: No results for input(s): CKTOTAL, CKMB, CKMBINDEX, TROPONINI in the last 168 hours. BNP (last 3 results) No results for input(s): PROBNP in the last 8760 hours. HbA1C: No results for input(s): HGBA1C in the last 72 hours. CBG: No results for input(s): GLUCAP in the last 168 hours. Lipid Profile: No results for input(s): CHOL, HDL, LDLCALC, TRIG, CHOLHDL, LDLDIRECT in the last 72 hours. Thyroid Function  Tests: No results for input(s): TSH, T4TOTAL, FREET4, T3FREE, THYROIDAB in the last 72 hours. Anemia Panel: No results for input(s): VITAMINB12, FOLATE, FERRITIN, TIBC, IRON, RETICCTPCT in the last 72 hours. Urine analysis:    Component Value Date/Time   COLORURINE YELLOW 08/20/2020 2200   APPEARANCEUR CLOUDY (A) 08/20/2020 2200   LABSPEC 1.021 08/20/2020 2200   PHURINE 5.0 08/20/2020 2200   GLUCOSEU NEGATIVE 08/20/2020 2200   HGBUR NEGATIVE 08/20/2020 2200   BILIRUBINUR NEGATIVE 08/20/2020 2200   BILIRUBINUR negative 06/29/2017 1200   KETONESUR NEGATIVE 08/20/2020 2200   PROTEINUR 100 (A) 08/20/2020 2200   UROBILINOGEN 0.2 07/05/2019 0828   NITRITE NEGATIVE 08/20/2020 2200   LEUKOCYTESUR SMALL (A) 08/20/2020 2200   Sepsis Labs: _0 (procalcitonin:4,lacticidven:4) ) Recent Results (from the past 240 hour(s))  Resp Panel by RT-PCR (Flu A&B, Covid) Nasopharyngeal Swab     Status: None   Collection Time: 08/16/20  5:16 PM   Specimen: Nasopharyngeal Swab; Nasopharyngeal(NP) swabs in vial transport medium  Result Value Ref Range Status   SARS Coronavirus 2 by RT PCR NEGATIVE NEGATIVE Final    Comment: (NOTE) SARS-CoV-2 target nucleic acids are NOT DETECTED.  The SARS-CoV-2 RNA is generally detectable in upper respiratory specimens during the acute phase of infection. The lowest concentration of SARS-CoV-2 viral copies this assay can detect is 138 copies/mL. A negative result does not preclude SARS-Cov-2 infection and should not be used as the sole basis for treatment or other patient management decisions. A negative result may occur with  improper specimen collection/handling, submission of specimen other than nasopharyngeal swab, presence of viral mutation(s) within the areas targeted by this assay, and inadequate number of viral copies(<138 copies/mL). A negative result must be combined with clinical observations, patient history, and epidemiological information. The  expected result is Negative.  Fact Sheet for Patients:  EntrepreneurPulse.com.au  Fact Sheet for Healthcare Providers:  IncredibleEmployment.be  This test is no t yet approved or cleared by the [place] FDA and  has been authorized for detection and/or diagnosis of SARS-CoV-2 by FDA under an Emergency Use Authorization (EUA). This EUA will remain  in effect (meaning this test can be used) for the duration of the COVID-19 declaration under Section 564(b)(1) of the Act, 21 U.S.C.section 360bbb-3(b)(1), unless the authorization is terminated  or revoked sooner.       Influenza A by PCR NEGATIVE  NEGATIVE Final   Influenza B by PCR NEGATIVE NEGATIVE Final    Comment: (NOTE) The Xpert Xpress SARS-CoV-2/FLU/RSV plus assay is intended as an aid in the diagnosis of influenza from Nasopharyngeal swab specimens and should not be used as a sole basis for treatment. Nasal washings and aspirates are unacceptable for Xpert Xpress SARS-CoV-2/FLU/RSV testing.  Fact Sheet for Patients: EntrepreneurPulse.com.au  Fact Sheet for Healthcare Providers: IncredibleEmployment.be  This test is not yet approved or cleared by the [place] FDA and has been authorized for detection and/or diagnosis of SARS-CoV-2 by FDA under an Emergency Use Authorization (EUA). This EUA will remain in effect (meaning this test can be used) for the duration of the COVID-19 declaration under Section 564(b)(1) of the Act, 21 U.S.C. section 360bbb-3(b)(1), unless the authorization is terminated or revoked.  Performed at [facility], [place] [address], [place], [place] [address]   Resp Panel by RT-PCR (Flu A&B, Covid) Nasopharyngeal Swab     Status: None   Collection Time: 08/20/20  9:56 PM   Specimen: Nasopharyngeal Swab; Nasopharyngeal(NP) swabs in vial transport medium  Result Value Ref Range Status   SARS Coronavirus 2  by RT PCR NEGATIVE NEGATIVE Final    Comment: (NOTE) SARS-CoV-2 target nucleic acids are NOT DETECTED.  The SARS-CoV-2 RNA is generally detectable in upper respiratory specimens during the acute phase of infection. The lowest concentration of SARS-CoV-2 viral copies this assay can detect is 138 copies/mL. A negative result does not preclude SARS-Cov-2 infection and should not be used as the sole basis for treatment or other patient management decisions. A negative result may occur with  improper specimen collection/handling, submission of specimen other than nasopharyngeal swab, presence of viral mutation(s) within the areas targeted by this assay, and inadequate number of viral copies(<138 copies/mL). A negative result must be combined with clinical observations, patient history, and epidemiological information. The expected result is Negative.  Fact Sheet for Patients:  EntrepreneurPulse.com.au  Fact Sheet for Healthcare Providers:  IncredibleEmployment.be  This test is no t yet approved or cleared by the [place] FDA and  has been authorized for detection and/or diagnosis of SARS-CoV-2 by FDA under an Emergency Use Authorization (EUA). This EUA will remain  in effect (meaning this test can be used) for the duration of the COVID-19 declaration under Section 564(b)(1) of the Act, 21 U.S.C.section 360bbb-3(b)(1), unless the authorization is terminated  or revoked sooner.       Influenza A by PCR NEGATIVE NEGATIVE Final   Influenza B by PCR NEGATIVE NEGATIVE Final    Comment: (NOTE) The Xpert Xpress SARS-CoV-2/FLU/RSV plus assay is intended as an aid in the diagnosis of influenza from Nasopharyngeal swab specimens and should not be used as a sole basis for treatment. Nasal washings and aspirates are unacceptable for Xpert Xpress SARS-CoV-2/FLU/RSV testing.  Fact Sheet for Patients: EntrepreneurPulse.com.au  Fact  Sheet for Healthcare Providers: IncredibleEmployment.be  This test is not yet approved or cleared by the [place] FDA and has been authorized for detection and/or diagnosis of SARS-CoV-2 by FDA under an Emergency Use Authorization (EUA). This EUA will remain in effect (meaning this test can be used) for the duration of the COVID-19 declaration under Section 564(b)(1) of the Act, 21 U.S.C. section 360bbb-3(b)(1), unless the authorization is terminated or revoked.  Performed at [facility], [place] [address], [place], [place] [address]      Radiological Exams on Admission: DG Chest Portable 1 View  Result Date: 08/20/2020 CLINICAL DATA:  Fever EXAM: PORTABLE CHEST 1 VIEW COMPARISON:  08/16/2020 FINDINGS: Asymmetric hazy opacity left thorax likely due to rotation and overlying soft tissues. No consolidation or effusion. Normal heart size. No pneumothorax. IMPRESSION: No active disease. Electronically Signed   By: Donavan Foil M.D.   On: 08/20/2020 22:41      Assessment/Plan Principal Problem:   Sepsis ([place]) Active Problems:   ADHD (attention deficit hyperactivity disorder)   GAD (generalized anxiety disorder)   Acute metabolic encephalopathy     #1 sepsis with endorgan involvement: Patient has met sepsis criteria and also confused with suspected CNS involvement.  We will admit the patient and initiate treatment for meningitis empirically.  Patient should get LP under fluoroscopy tomorrow.  Depending on the results further adjustment of medications could be made.  Blood cultures obtained.  May have mild UTI but Rocephin should cover that.  Patient will likely need neurology consultation also.  #2 acute metabolic encephalopathy: Could be related to #1 above or patient's known psychiatric illness.  Continue medical treatment and if cleared psychiatric consultation.  #3 history of anxiety disorder and ADHD: Continue home medication as much as  possible.  #4 seizures and spasms: Could be related to CNS involvement.  Continue treatment as above.     DVT prophylaxis: SCD Code Status: Full code Family Communication: Mother at bedside Disposition Plan: To be determined Consults called: Consult IR for lumbar puncture in the morning Admission status: Inpatient  Severity of Illness: The appropriate patient status for this patient is INPATIENT. Inpatient status is judged to be reasonable and necessary in order to provide the required intensity of service to ensure the patient's safety. The patient's presenting symptoms, physical exam findings, and initial radiographic and laboratory data in the context of their chronic comorbidities is felt to place them at high risk for further clinical deterioration. Furthermore, it is not anticipated that the patient will be medically stable for discharge from the hospital within 2 midnights of admission. The following factors support the patient status of inpatient.   " The patient's presenting symptoms include confusion and fever. " The worrisome physical exam findings include altered mental status. " The initial radiographic and laboratory data are worrisome because of sepsis. " The chronic co-morbidities include severe anxiety disorder with ADHD.   * I certify that at the point of admission it is my clinical judgment that the patient will require inpatient hospital care spanning beyond 2 midnights from the point of admission due to high intensity of service, high risk for further deterioration and high frequency of surveillance required.Barbette Merino MD [facility] Pager [phone]  If 7PM-7AM, please contact night-coverage [email or web address] Password [facility]  08/21/2020, 12:41 AM

## 2020-08-21 NOTE — ED Notes (Signed)
Pt provided toothbrush, soap, lotion, Deoderant, etc to help get cleaned up with
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
## 2020-08-22 ENCOUNTER — Inpatient Hospital Stay (HOSPITAL_COMMUNITY): Payer: BC Managed Care – PPO

## 2020-08-22 DIAGNOSIS — G2579 Other drug induced movement disorders: Secondary | ICD-10-CM

## 2020-08-22 DIAGNOSIS — R509 Fever, unspecified: Secondary | ICD-10-CM

## 2020-08-22 LAB — CREATININE, SERUM
Creatinine, Ser: 0.86 mg/dL (ref 0.44–1.00)
GFR, Estimated: >60 mL/min (ref >60)

## 2020-08-22 IMAGING — MR MR CERVICAL SPINE W/O CM
5 of 6 series · 34 of 48 positions shown · non-contrast
Comparison: None.

CLINICAL DATA: Myelopathy, acute or progressive.

Mid back pain, neuro deficit.
Low back pain, progressive neurological deficit.
EXAM:
MRI CERVICAL, THORACIC AND LUMBAR SPINE WITHOUT CONTRAST
TECHNIQUE: Multiplanar and multiecho pulse sequences of the cervical spine, to
include the craniocervical junction and cervicothoracic junction,
and thoracic and lumbar spine, were obtained without intravenous
contrast.

[Series 5: T2 · sagittal · 3.0mm · 0.69mm/px · 6 of 15 slices shown (1 of 2)]
[im 1/15]
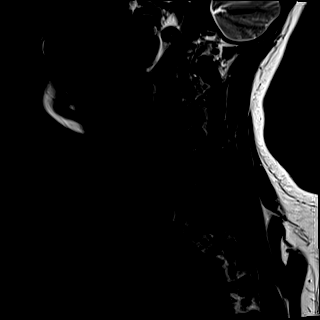
[im 3/15]
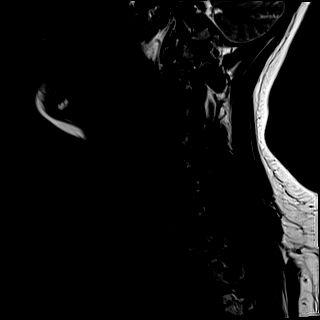
[im 6/15]
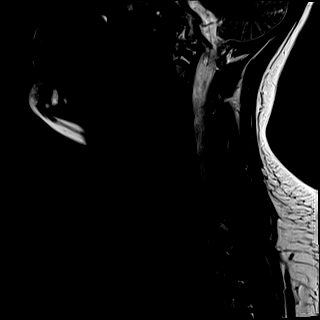
[im 9/15]
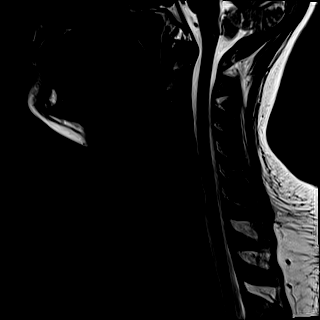
[im 12/15]
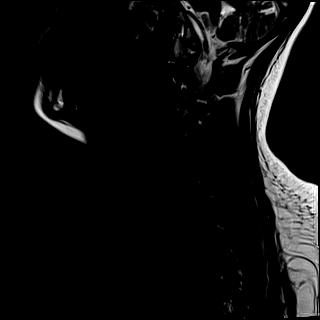
[im 15/15]
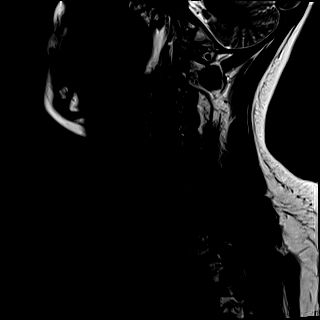

[Series 6: T1 · sagittal · 3.0mm · 0.69mm/px · 6 of 15 slices shown (1 of 2)]
[im 1/15]
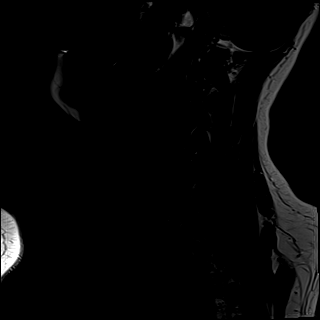
[im 3/15]
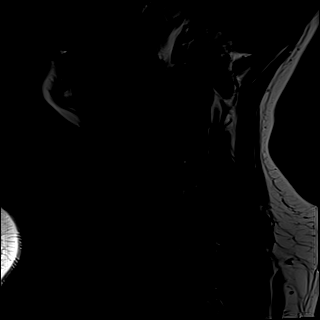
[im 6/15]
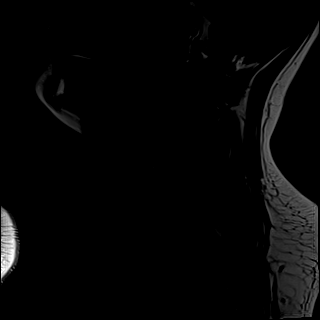
[im 9/15]
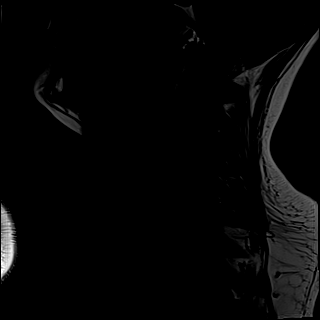
[im 12/15]
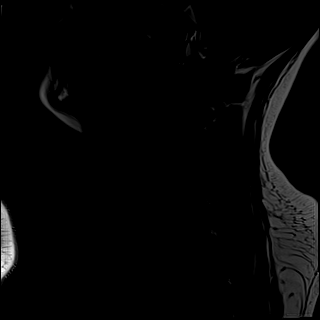
[im 15/15]
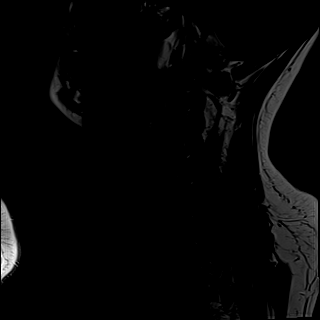

[Series 7: STIR · sagittal · 3.0mm · 0.86mm/px · 6 of 15 slices shown]
[im 1/15]
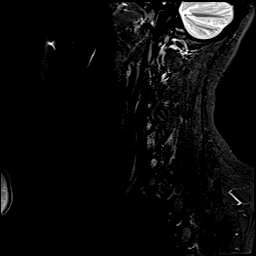
[im 3/15]
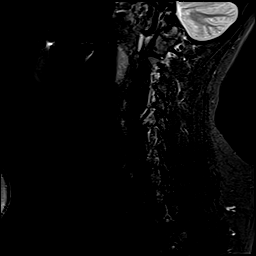
[im 6/15]
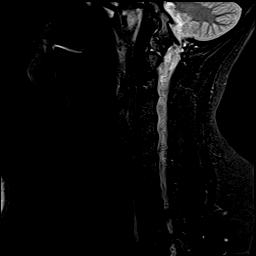
[im 9/15]
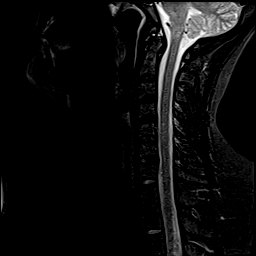
[im 12/15]
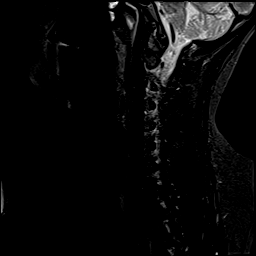
[im 15/15]
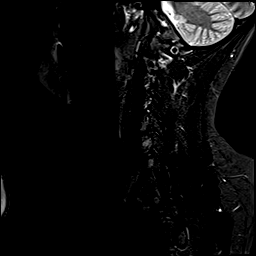

[Series 8: T2 · axial · 3.0mm · 0.70mm/px · z∈[+5,+99]mm · 8 of 28 slices shown (2 of 2)]
[im 1/28]
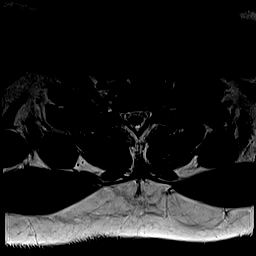
[im 4/28]
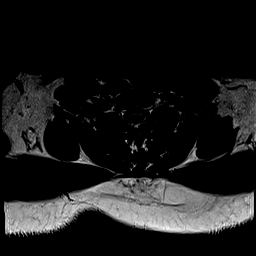
[im 10/28]
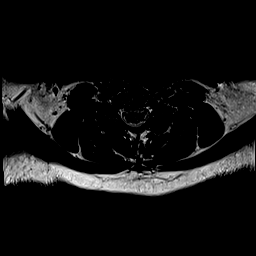
[im 13/28]
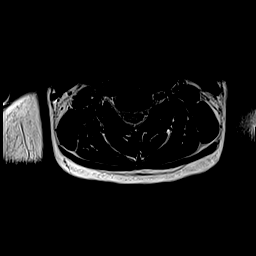
[im 16/28]
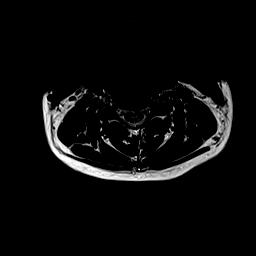
[im 19/28]
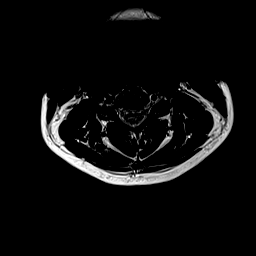
[im 25/28]
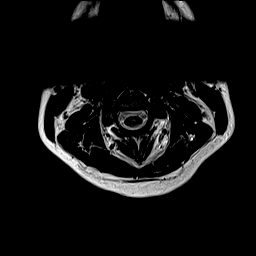
[im 28/28]
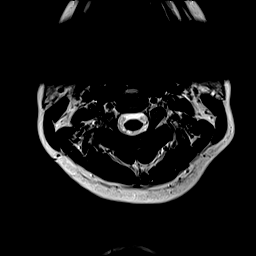

[Series 10: T1 · axial · 3.0mm · 0.35mm/px · z∈[+5,+99]mm · 8 of 28 slices shown (2 of 2)]
[im 1/28]
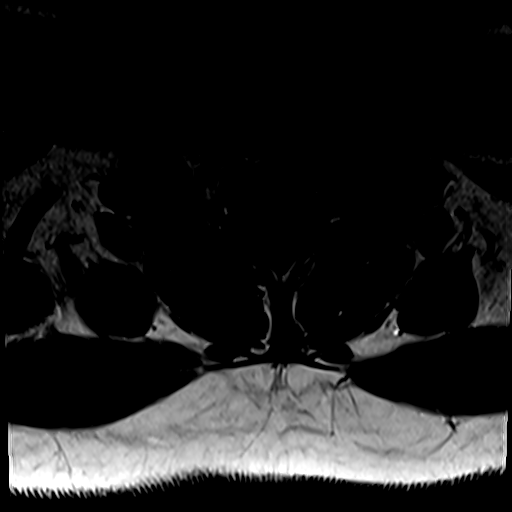
[im 4/28]
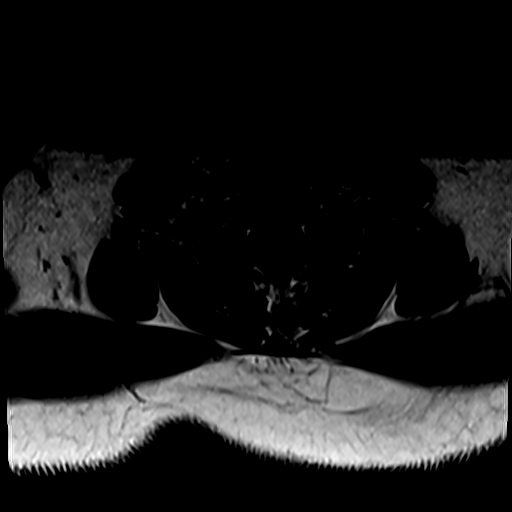
[im 10/28]
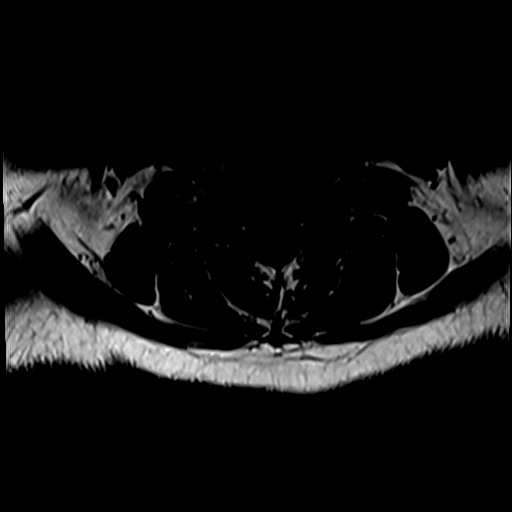
[im 13/28]
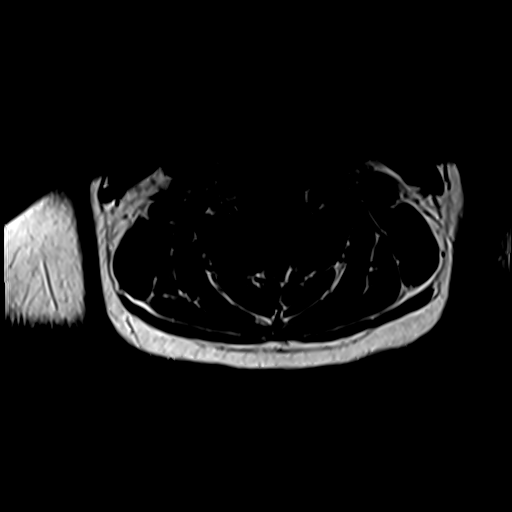
[im 16/28]
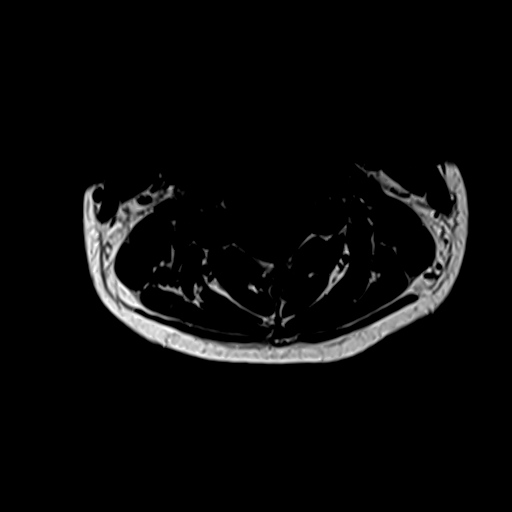
[im 19/28]
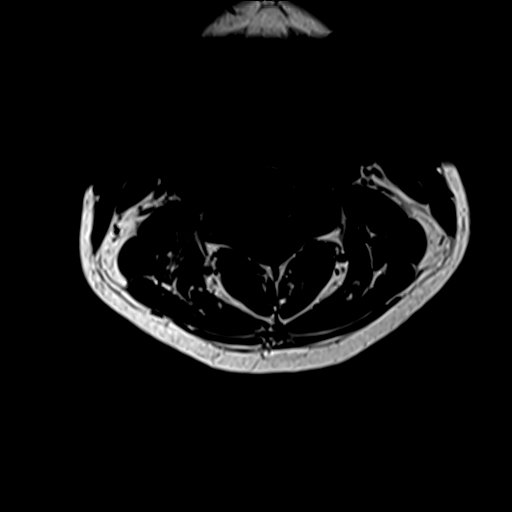
[im 25/28]
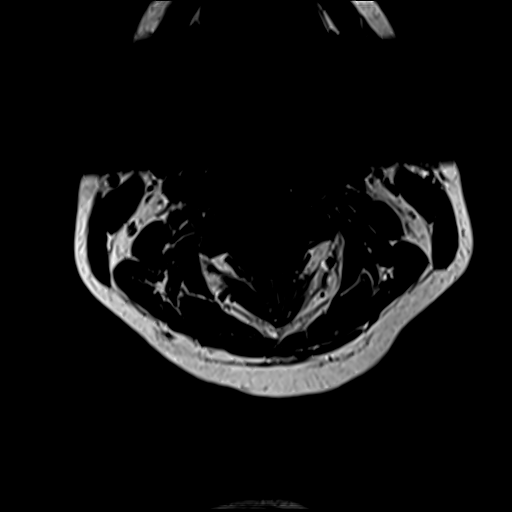
[im 28/28]
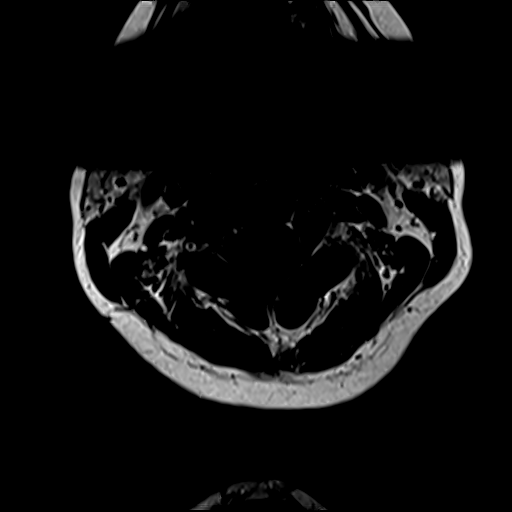

[34 of 48 positions shown; findings below may reference images not displayed]

FINDINGS: MRI CERVICAL SPINE FINDINGS

Alignment: Straightening of the cervical curvature.

Vertebrae: No fracture, evidence of discitis, or bone lesion.

Cord: Normal signal and morphology.

Posterior Fossa, vertebral arteries, paraspinal tissues: Negative.

Disc levels:

Small posterior disc protrusions at C5-6 and C6-7. No spinal canal
or neural foraminal stenosis at any cervical level.

MRI THORACIC SPINE FINDINGS

Study degraded by motion, particularly the axial images.

Alignment: Physiologic.

Vertebrae: Subtle marrow edema in the superior endplate of T5, T6,
T8, T9, T10 and T11 concerning for possible small compression
fractures in the setting of traumatic injury without significant
vertebral body height loss or retropulsion. No aggressive bone
lesion identified.

Cord:  Normal signal.

Paraspinal and other soft tissues: Small bilateral pleural effusion.

Disc levels:

Small posterior disc protrusion at T7-8 without significant spinal
canal stenosis.

Posterior disc protrusion at T11-12 resulting in mild spinal canal
stenosis and flattening of the anterior cord contour without cord
signal abnormality.

No disc protrusion or significant spinal canal stenosis in the other
thoracic levels.

No neural foraminal stenosis at any thoracic level.

MRI LUMBAR SPINE FINDINGS

Incomplete study due to patient inability to lie still in the
scanner for the duration of the study.

Segmentation: Standard.

Alignment:  Physiologic.

Vertebrae: No fracture, evidence of discitis, or bone lesion.
Endplate degenerative changes at L5-S1.

Conus medullaris and cauda equina: Conus extends to the L1 level.
Conus and cauda equina appear normal.

Paraspinal and other soft tissues: Negative.

Disc levels:

L1-2 through L3-4: Negative.

L4-5: Moderate facet degenerative changes. No spinal canal or neural
foraminal stenosis.

L5-S1: Loss of disc high, disc bulge with superimposed small left
foraminal disc protrusion mild facet degenerative changes resulting
in mild left neural foraminal narrow.
IMPRESSION: 1. Subtle marrow edema in the superior endplate of T5, T6, T8, T9,
T10, and T11 concerning for possible small compression fractures in
the setting of traumatic injury without significant vertebral body
height loss or retropulsion. Correlation with CT suggested.
2. Disc protrusion with mild spinal canal stenosis at T11-12 with
flattening of the anterior cord contour.
3. No high-grade spinal canal or neural foraminal stenosis at any
level within the cervical, thoracic, or lumbar spine.

## 2020-08-22 IMAGING — MR MR LUMBAR SPINE W/O CM
3 series · 30 of 48 positions shown · non-contrast
Comparison: None.

CLINICAL DATA: Myelopathy, acute or progressive.

Mid back pain, neuro deficit.
Low back pain, progressive neurological deficit.
EXAM:
MRI CERVICAL, THORACIC AND LUMBAR SPINE WITHOUT CONTRAST
TECHNIQUE: Multiplanar and multiecho pulse sequences of the cervical spine, to
include the craniocervical junction and cervicothoracic junction,
and thoracic and lumbar spine, were obtained without intravenous
contrast.

[Series 34: T1 · sagittal · 4.0mm · 0.81mm/px · 8 of 14 slices shown]
[im 1/14]
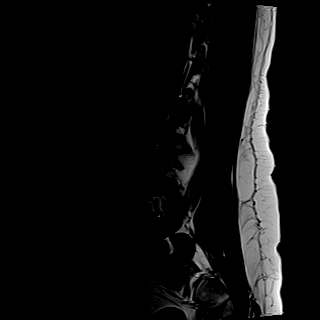
[im 2/14]
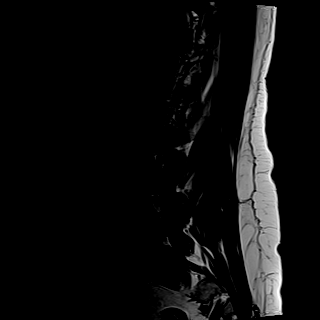
[im 5/14]
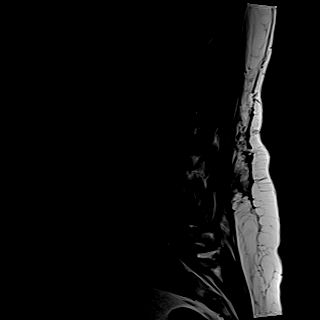
[im 6/14]
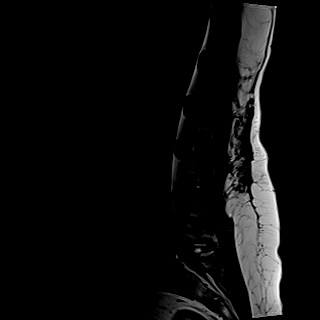
[im 8/14]
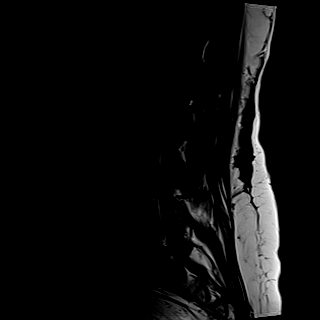
[im 9/14]
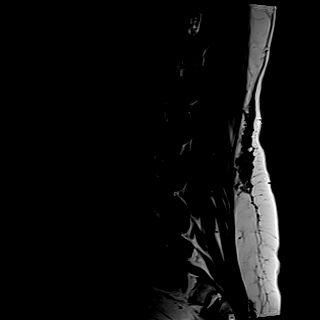
[im 12/14]
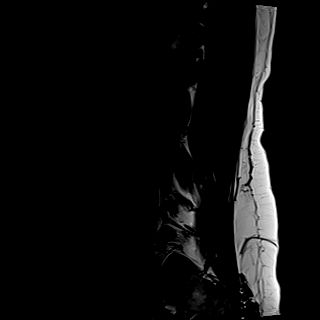
[im 14/14]
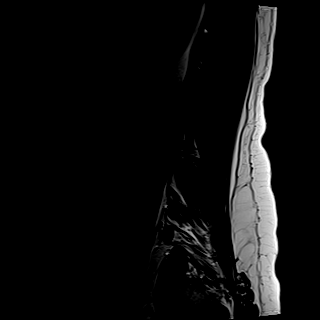

[Series 35: STIR · sagittal · 4.0mm · 0.51mm/px · 9 of 14 slices shown]
[im 1/14]
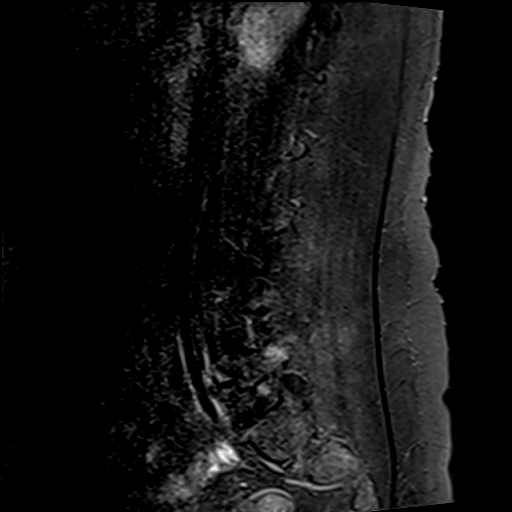
[im 2/14]
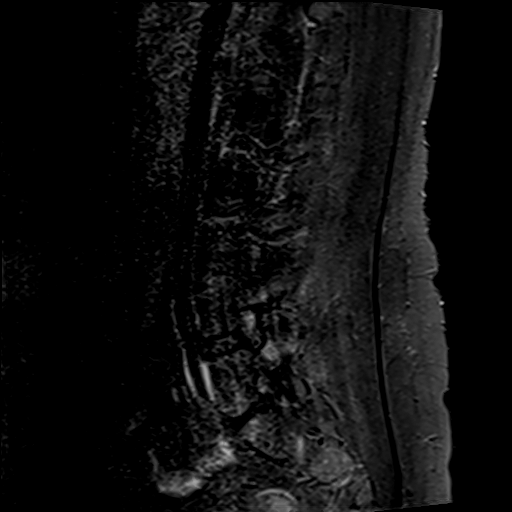
[im 4/14]
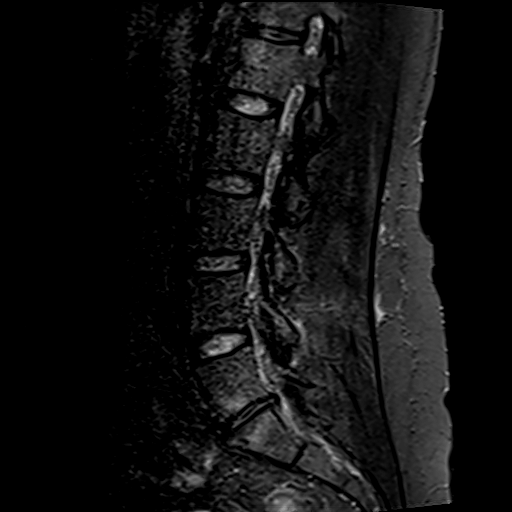
[im 5/14]
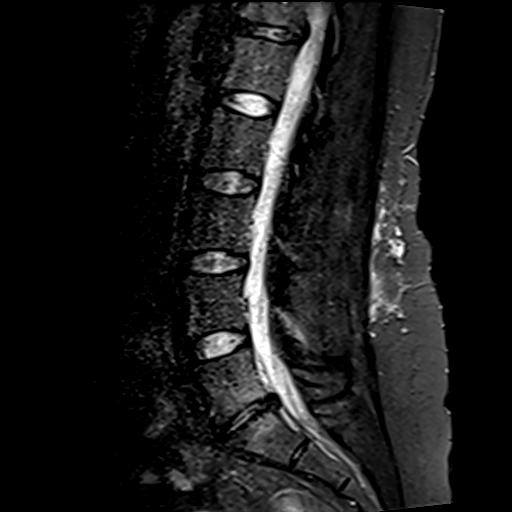
[im 7/14]
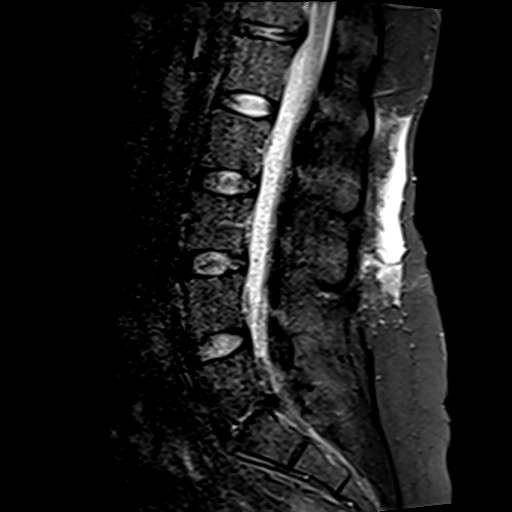
[im 9/14]
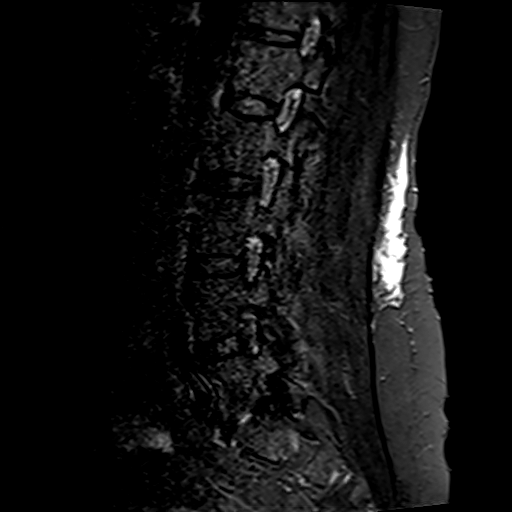
[im 10/14]
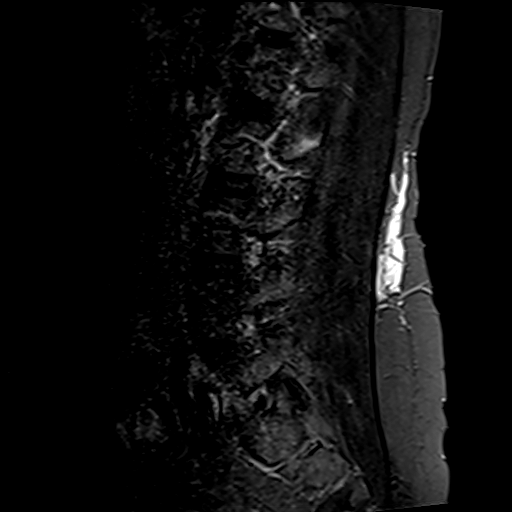
[im 12/14]
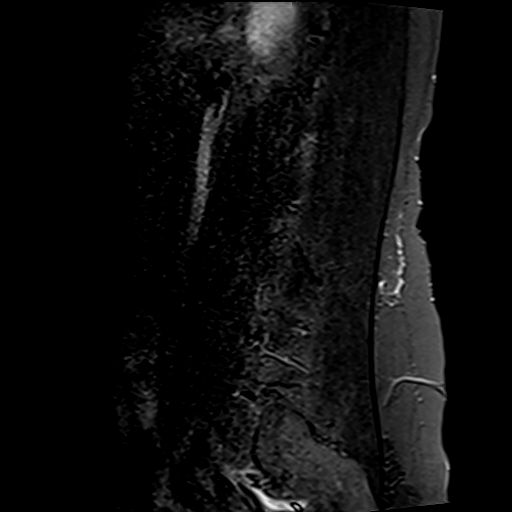
[im 14/14]
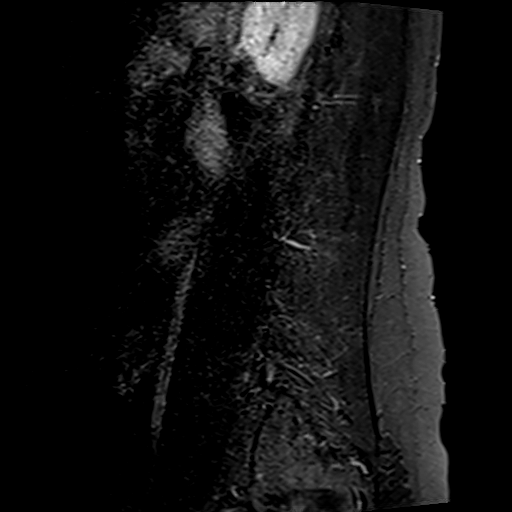

[Series 36: T2 · axial · 4.0mm · 0.62mm/px · z∈[-545,-290]mm · 13 of 43 slices shown]
[im 1/43]
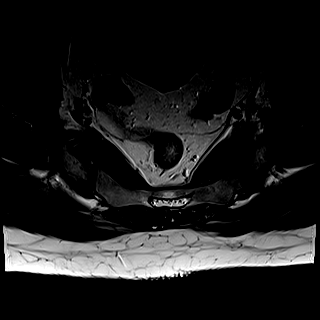
[im 2/43]
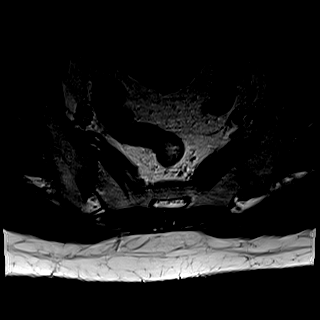
[im 4/43]
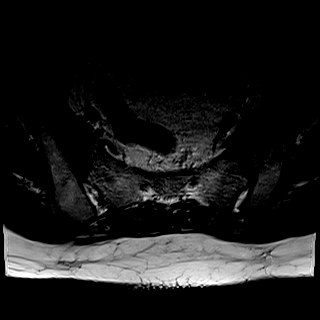
[im 7/43]
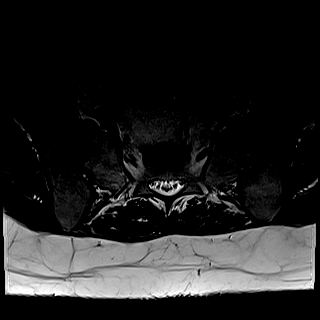
[im 8/43]
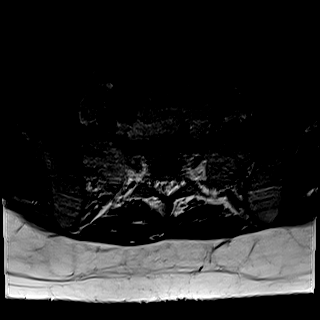
[im 14/43]
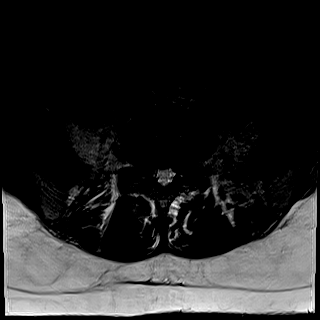
[im 19/43]
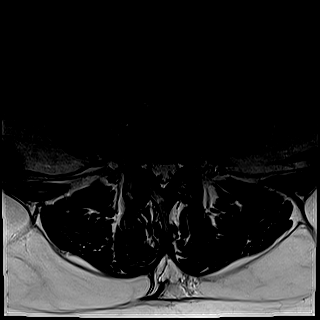
[im 22/43]
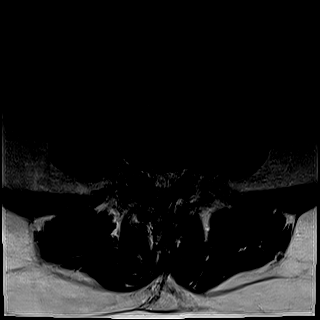
[im 25/43]
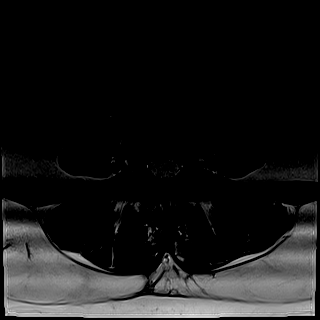
[im 29/43]
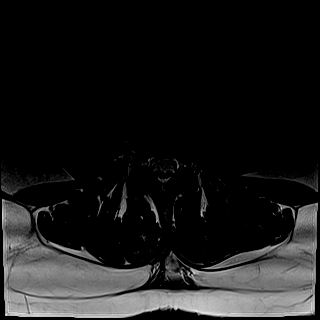
[im 35/43]
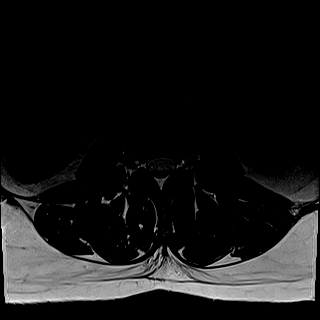
[im 37/43]
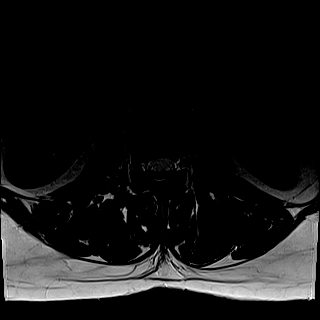
[im 41/43]
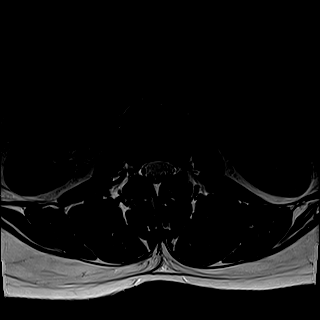

[30 of 48 positions shown; findings below may reference images not displayed]

FINDINGS: MRI CERVICAL SPINE FINDINGS

Alignment: Straightening of the cervical curvature.

Vertebrae: No fracture, evidence of discitis, or bone lesion.

Cord: Normal signal and morphology.

Posterior Fossa, vertebral arteries, paraspinal tissues: Negative.

Disc levels:

Small posterior disc protrusions at C5-6 and C6-7. No spinal canal
or neural foraminal stenosis at any cervical level.

MRI THORACIC SPINE FINDINGS

Study degraded by motion, particularly the axial images.

Alignment: Physiologic.

Vertebrae: Subtle marrow edema in the superior endplate of T5, T6,
T8, T9, T10 and T11 concerning for possible small compression
fractures in the setting of traumatic injury without significant
vertebral body height loss or retropulsion. No aggressive bone
lesion identified.

Cord:  Normal signal.

Paraspinal and other soft tissues: Small bilateral pleural effusion.

Disc levels:

Small posterior disc protrusion at T7-8 without significant spinal
canal stenosis.

Posterior disc protrusion at T11-12 resulting in mild spinal canal
stenosis and flattening of the anterior cord contour without cord
signal abnormality.

No disc protrusion or significant spinal canal stenosis in the other
thoracic levels.

No neural foraminal stenosis at any thoracic level.

MRI LUMBAR SPINE FINDINGS

Incomplete study due to patient inability to lie still in the
scanner for the duration of the study.

Segmentation: Standard.

Alignment:  Physiologic.

Vertebrae: No fracture, evidence of discitis, or bone lesion.
Endplate degenerative changes at L5-S1.

Conus medullaris and cauda equina: Conus extends to the L1 level.
Conus and cauda equina appear normal.

Paraspinal and other soft tissues: Negative.

Disc levels:

L1-2 through L3-4: Negative.

L4-5: Moderate facet degenerative changes. No spinal canal or neural
foraminal stenosis.

L5-S1: Loss of disc high, disc bulge with superimposed small left
foraminal disc protrusion mild facet degenerative changes resulting
in mild left neural foraminal narrow.
IMPRESSION: 1. Subtle marrow edema in the superior endplate of T5, T6, T8, T9,
T10, and T11 concerning for possible small compression fractures in
the setting of traumatic injury without significant vertebral body
height loss or retropulsion. Correlation with CT suggested.
2. Disc protrusion with mild spinal canal stenosis at T11-12 with
flattening of the anterior cord contour.
3. No high-grade spinal canal or neural foraminal stenosis at any
level within the cervical, thoracic, or lumbar spine.

## 2020-08-22 IMAGING — MR MR HEAD W/O CM
11 series · 48 of 48 positions shown · non-contrast
Comparison: Head CT, [DATE].

CLINICAL DATA: Seizure, abnormal neuro exam.

EXAM:
MRI HEAD WITHOUT CONTRAST
TECHNIQUE: Multiplanar, multiecho pulse sequences of the brain and surrounding
structures were obtained without intravenous contrast.

[Series 7: T1 · sagittal · 5.0mm · 0.75mm/px · 1 of 21 slices shown (1 of 2)]
[im 1/21]
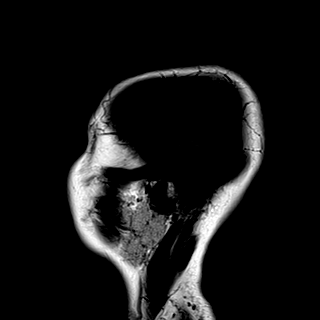

[Series 8: T2 · axial · 5.0mm · 0.62mm/px · z∈[-25,+124]mm · 2 of 24 slices shown (1 of 3)]
[im 1/24]
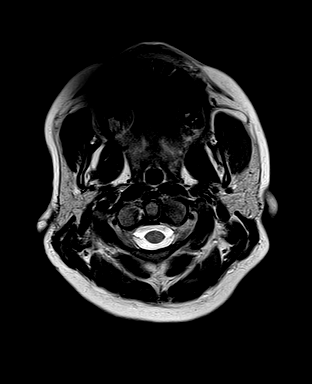
[im 24/24]
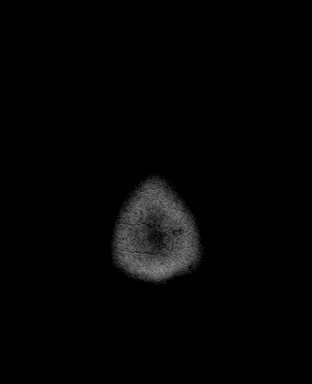

[Series 9: mip_images(sw) · axial · 24.0mm · 0.75mm/px · z∈[-22,+122]mm · 4 of 49 slices shown]
[im 1/49]
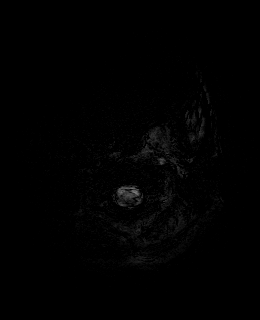
[im 17/49]
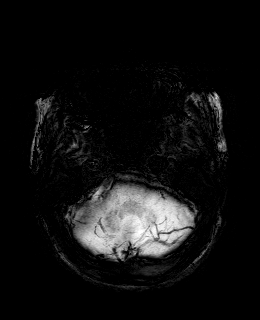
[im 33/49]
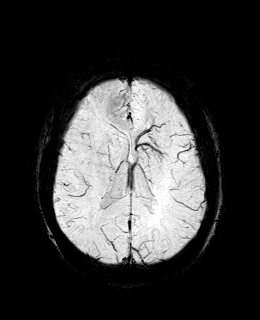
[im 49/49]
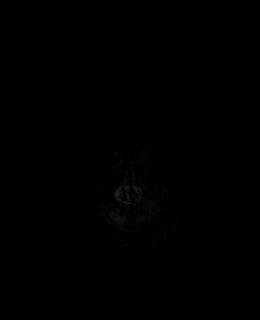

[Series 10: swi_images · axial · 3.0mm · 0.75mm/px · z∈[-33,+132]mm · 5 of 56 slices shown]
[im 1/56]
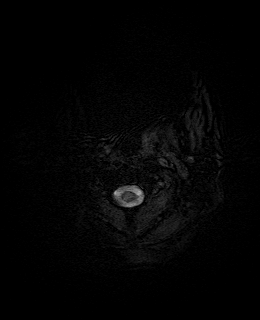
[im 14/56]
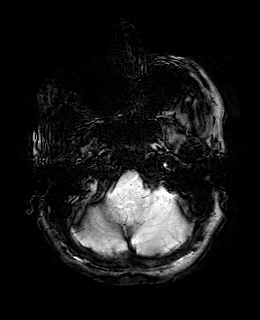
[im 28/56]
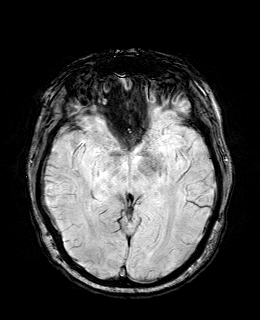
[im 42/56]
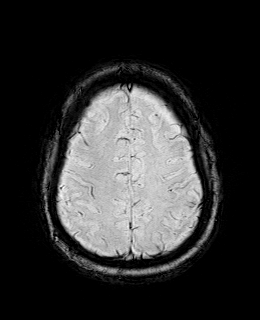
[im 56/56]
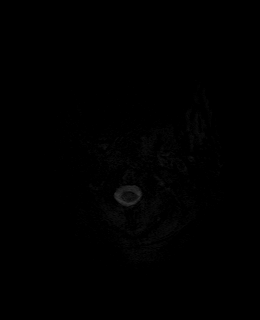

[Series 11: FLAIR · axial · 3.0mm · 0.75mm/px · z∈[-27,+126]mm · 5 of 52 slices shown (1 of 2)]
[im 1/52]
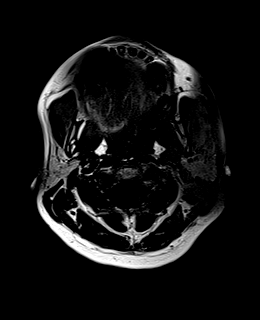
[im 13/52]
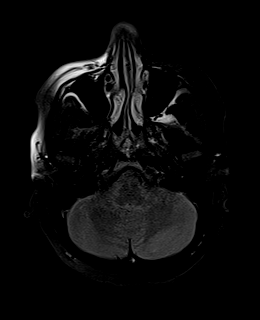
[im 26/52]
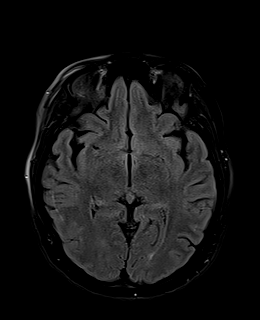
[im 39/52]
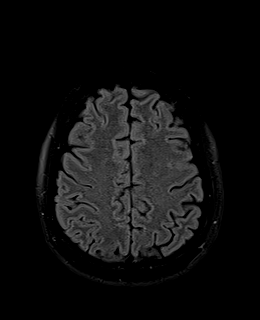
[im 52/52]
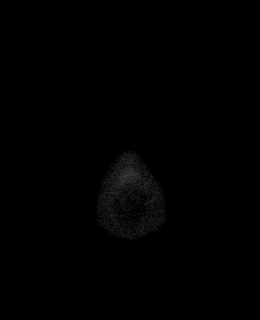

[Series 12: T1 · axial · 1.0mm · 0.94mm/px · z∈[-14,+129]mm · 13 of 144 slices shown (2 of 2)]
[im 1/144]
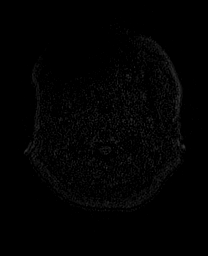
[im 12/144]
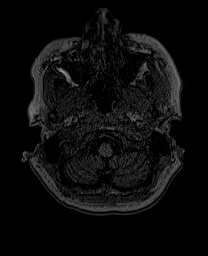
[im 24/144]
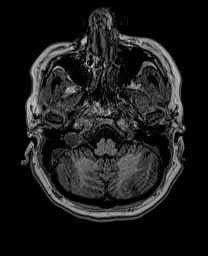
[im 36/144]
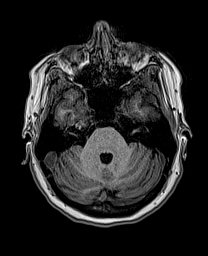
[im 48/144]
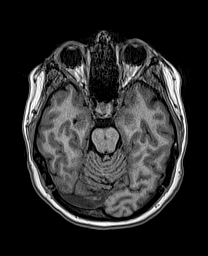
[im 60/144]
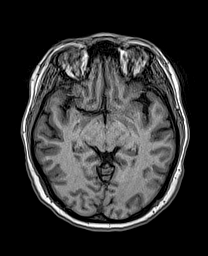
[im 72/144]
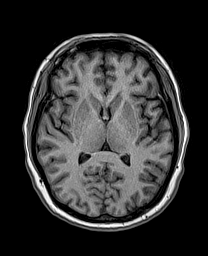
[im 84/144]
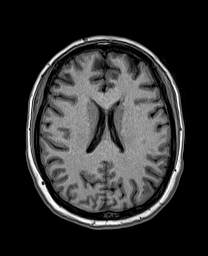
[im 96/144]
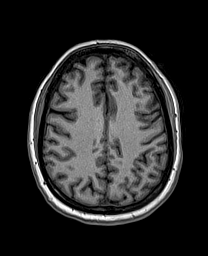
[im 108/144]
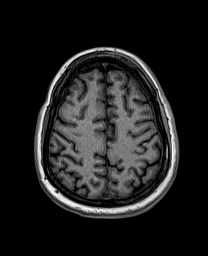
[im 120/144]
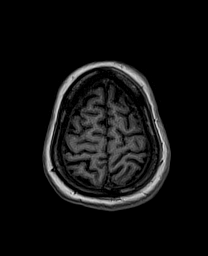
[im 132/144]
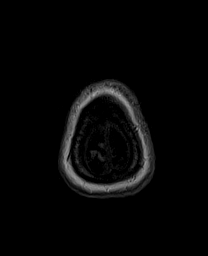
[im 144/144]
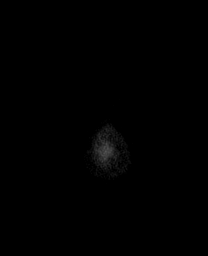

[Series 13: T2 · coronal · 3.0mm · 0.40mm/px · 3 of 34 slices shown (2 of 3)]
[im 1/34]
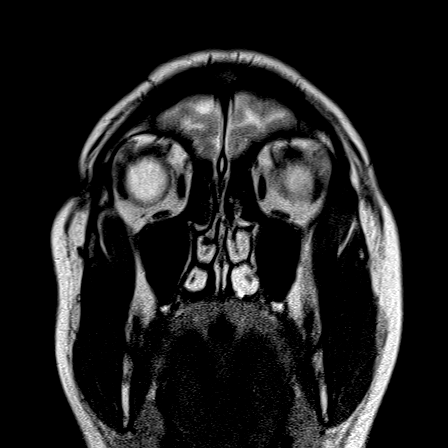
[im 17/34]
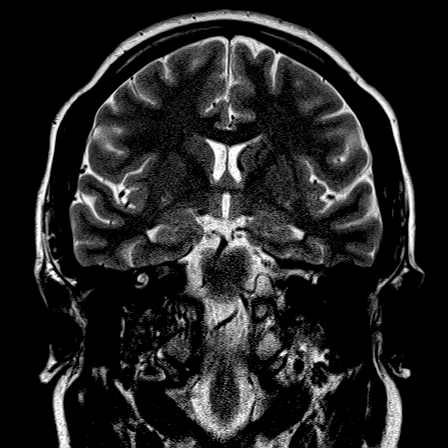
[im 34/34]
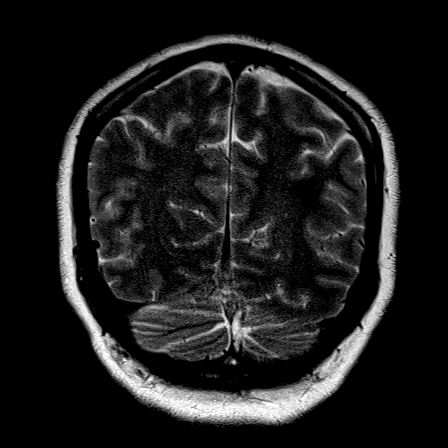

[Series 14: FLAIR · coronal · 3.0mm · 0.56mm/px · 3 of 34 slices shown (2 of 2)]
[im 1/34]
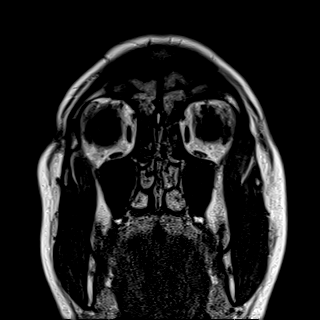
[im 17/34]
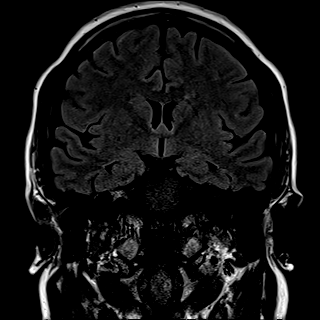
[im 34/34]
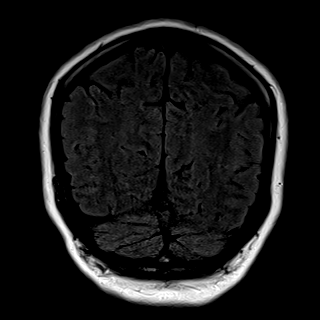

[Series 15: DWI · coronal · 5.0mm · 1.31mm/px · 6 of 64 slices shown (1 of 2)]
[im 1/64]
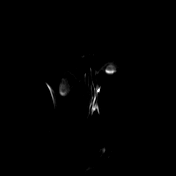
[im 13/64]
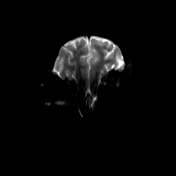
[im 26/64]
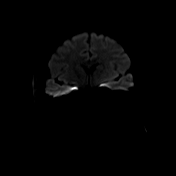
[im 38/64]
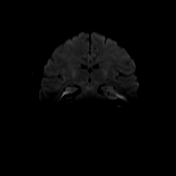
[im 51/64]
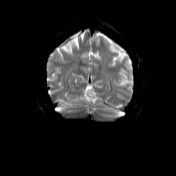
[im 64/64]
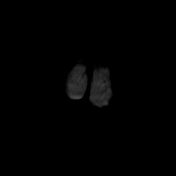

[Series 16: DWI · coronal · 5.0mm · 1.31mm/px · 3 of 32 slices shown (2 of 2)]
[im 1/32]
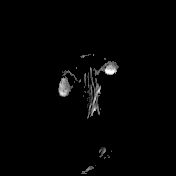
[im 16/32]
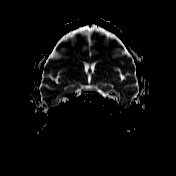
[im 32/32]
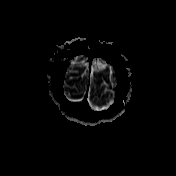

[Series 17: T2 · coronal · 5.0mm · 0.62mm/px · 3 of 32 slices shown (3 of 3)]
[im 1/32]
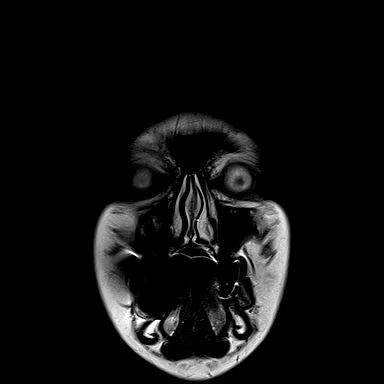
[im 16/32]
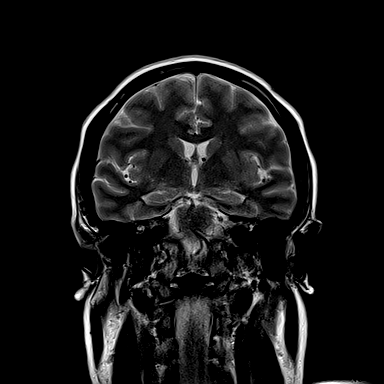
[im 32/32]
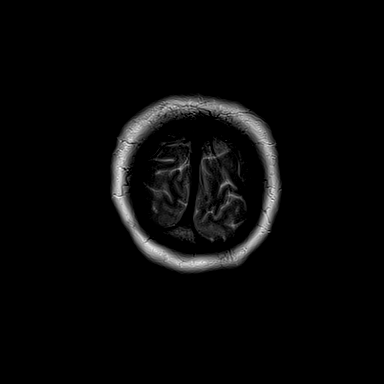

[48 of 48 positions shown; findings below may reference images not displayed]

FINDINGS: Brain: No acute infarction, hemorrhage, hydrocephalus, extra-axial
collection or mass lesion. The brain parenchyma has normal
morphology and signal characteristics. Mesial temporal lobes are
symmetric.

Vascular: Normal flow voids.

Skull and upper cervical spine: Normal marrow signal.

Sinuses/Orbits: Negative.

Other: None.
IMPRESSION: Unremarkable MRI of the brain.
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
## 2020-08-22 MED ORDER — QUETIAPINE FUMARATE 25 MG PO TABS
25.0000 mg | ORAL_TABLET | Freq: Two times a day (BID) | ORAL | Status: DC
  Administered 2020-08-22 – 2020-08-26 (×9): 25 mg via ORAL
  Filled 2020-08-22 (×9): qty 1

## 2020-08-22 MED ORDER — SODIUM CHLORIDE 0.9 % IV SOLN
2.0000 g | INTRAVENOUS | Status: AC
  Administered 2020-08-23: 2 g via INTRAVENOUS
  Filled 2020-08-22: qty 20

## 2020-08-22 NOTE — Plan of Care (Signed)
  Problem: Education: Goal: Knowledge of General Education information will improve Description Including pain rating scale, medication(s)/side effects and non-pharmacologic comfort measures Outcome: Progressing   

## 2020-08-22 NOTE — Consult Note (Signed)
Neurology Consultation Reason for Consult: Abnormal episodes Referring Physician: Lafe Garin, C  CC: Abnormal episodes.   History is obtained from:Patient  HPI: [name] [name] is a 29 y.o. female with a history of panic attacks, ADHD and takes Adderall and was on fluoxetine in the past, but she stopped it a few weeks ago.  It was then restarted last Thursday.  On Monday, she began having seizure-like episodes as well as confusion.  She began having abnormal episodes at the mom's suspicious for seizures.  She describes bilateral shaking type episodes lasting for a few minutes at a time, with only brief respite before starting again.  She estimates approximately 40 episodes between 8 PM and midnight on 7/7.  She was conscious and able to speak while she was shaking bilaterally.  She was given Ativan shortly thereafter and has not had any further episodes since that time.  She has now returned essentially to her baseline, but still complains of some incontinence.   ROS: A 14 point ROS was performed and is negative except as noted in the HPI.  Past Medical History:  Diagnosis Date  . ADHD (attention deficit hyperactivity disorder)   . Anxiety   . Panic attacks   . Vaginal Pap smear, abnormal      Family History  Problem Relation Age of Onset  . Hypertension Mother   . Heart disease Father   . Cancer Father        testicular  . Hypertension Father   . Hyperlipidemia Father   . Heart disease Paternal Grandfather      Social History:  reports that she quit smoking about 7 years ago. Her smoking use included cigarettes. She has never used smokeless tobacco. She reports that she does not drink alcohol and does not use drugs.   Exam: Current vital signs: BP (!) 142/82 (BP Location: Left Arm)   Pulse 63   Temp 99.4 F (37.4 C) (Oral)   Resp (!) 22   Ht 5\' 8"  (1.727 m)   Wt 90.7 kg   SpO2 95%   BMI 30.41 kg/m  Vital signs in last 24 hours: Temp:  [98.8 F (37.1 C)-99.6 F (37.6 C)]  99.4 F (37.4 C) (12/08 1549) Pulse Rate:  [60-95] 63 (12/08 1549) Resp:  [16-24] 22 (12/08 1549) BP: (132-151)/(82-88) 142/82 (12/08 1549) SpO2:  [94 %-98 %] 95 % (12/08 1549)   Physical Exam  Constitutional: Appears well-developed and well-nourished.  Psych: Affect appropriate to situation Eyes: No scleral injection HENT: No OP obstrucion MSK: no joint deformities.  Cardiovascular: Normal rate and regular rhythm.  Respiratory: Effort normal, non-labored breathing GI: Soft.  No distension. There is no tenderness.  Skin: WDI  Neuro: Mental Status: Patient is awake, alert, oriented to person, place, month, year, and situation. Patient is able to give a clear and coherent history. No signs of aphasia or neglect Cranial Nerves: II: Visual Fields are full. Pupils are equal, round, and reactive to light.   III,IV, VI: EOMI without ptosis or diploplia.  V: Facial sensation is symmetric to temperature VII: Facial movement is symmetric.  VIII: hearing is intact to voice X: Uvula elevates symmetrically XI: Shoulder shrug is symmetric. XII: tongue is midline without atrophy or fasciculations.  Motor: Tone is normal. Bulk is normal. 5/5 strength was present in all four extremities.  Sensory: Sensation is symmetric to light touch and temperature in the arms and legs. Deep Tendon Reflexes: 2+ and symmetric in the biceps and patellae.  Plantars: Toes  are downgoing bilaterally.  There is no clonus. Cerebellar: No clear ataxia   I have reviewed labs in epic and the results pertinent to this consultation are: CSF WBC 2 CSF RBC 0 CSF protein 28 CSF glucose 103  I have reviewed the images obtained: MRI brain-negative MRI spine-no intrinsic cord signal she does have a disc bulge in the lower thoracic spine with continued presence of CSF posterior to this.  Impression: 29 year old female with recurrent spasms of unclear etiology, confusion, fever, leukocytosis.  I certainly think  serotonin syndrome is a very significant possibility given that she had only just restarted fluoxetine.  Another possibility would be that the white count and fever were due to urinary tract infection on the episodes were psychogenic in nature, but serotonin syndrome would be a more unifying diagnosis.  I do think an EEG is reasonable, but I would not start antiepileptics unless there was clear evidence of seizure predisposition on this.  The urinary incontinence is less clear, without any other symptoms, I think that central cord syndrome secondary to her thoracic spine bulge is unlikely.  If she did continue to have problems and a neurogenic source were suspected, SSEPs of the lower extremity might be useful, but these can only be done as an outpatient.  Recommendations: 1) EEG 2) if negative, no further inpatient recommendations at this time. 3) SSRI per psychiatry.  Ritta Slot, MD [facility] [phone]  If 7pm- 7am, please page neurology on call as listed in AMION.
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
## 2020-08-22 NOTE — Consult Note (Signed)
  [name] [name] is a 29 y.o. female with medical history significant of ADHD, and panic attacks who presented with confusion and agitation abnormal twitching and spasms as well as fever.  Patient was apparently at an outside facility at [facility] today.  She did after she went to [facility] to be treated for her anxiety and depression and had a seizure.  She was noted to have a fever.  She was also more confused.  Initially there was concern for possible serotonin syndrome.  Patient has recently stopped her Paxil but has restarted it.  She is also on Adderall and she has been taking all her medications.  She was discharged home but patient came in here with significant fever more confusion and more twitchiness.  Suspicion for encephalitis versus meningitis especially aseptic meningitis was done.  Attempted lumbar puncture in the ER has failed.  Patient has met sepsis criteria with temperature heart rate and pulse.  She also has leukocytosis.  She has endorgan involvement with her confusion.  She is being admitted with sepsis probably secondary to neurologic source.    Psych consult placed for depression, anxiety, paranoia, hallucinations, worsening symptoms, unsafe home environment. Unable to assess patient as she is in MRI. Patient with some psychotic symptoms at this time and could benefit from seroquel 75m po BID. She is currently being worked up for organic cause of psychosis.  -Will attempt to reassess patient once medically stable.  -WIll start Seroquel 261mpo BID.

## 2020-08-23 ENCOUNTER — Inpatient Hospital Stay (HOSPITAL_COMMUNITY)
Admit: 2020-08-23 | Discharge: 2020-08-23 | Disposition: A | Discharge status: Home / Self Care | Payer: BC Managed Care – PPO | Attending: Internal Medicine | Admitting: Internal Medicine

## 2020-08-23 DIAGNOSIS — G9341 Metabolic encephalopathy: Secondary | ICD-10-CM

## 2020-08-23 DIAGNOSIS — R4182 Altered mental status, unspecified: Secondary | ICD-10-CM

## 2020-08-23 LAB — COMPREHENSIVE METABOLIC PANEL
ALT: 48 U/L — ABNORMAL HIGH (ref 0–44)
AST: 77 U/L — ABNORMAL HIGH (ref 15–41)
Albumin: 4 g/dL (ref 3.5–5.0)
Alkaline Phosphatase: 96 U/L (ref 38–126)
Anion gap: 10 (ref 5–15)
BUN: 7 mg/dL (ref 6–20)
CO2: 23 mmol/L (ref 22–32)
Calcium: 8.8 mg/dL — ABNORMAL LOW (ref 8.9–10.3)
Chloride: 105 mmol/L (ref 98–111)
Creatinine, Ser: 0.87 mg/dL (ref 0.44–1.00)
GFR, Estimated: >60 mL/min (ref >60)
Glucose, Bld: 124 mg/dL — ABNORMAL HIGH (ref 70–99)
Potassium: 3.2 mmol/L — ABNORMAL LOW (ref 3.5–5.1)
Sodium: 138 mmol/L (ref 135–145)
Total Bilirubin: 1 mg/dL (ref 0.3–1.2)
Total Protein: 7.3 g/dL (ref 6.5–8.1)

## 2020-08-23 LAB — HSV DNA BY PCR (REFERENCE LAB)
HSV 1 DNA: NEGATIVE
HSV 2 DNA: NEGATIVE

## 2020-08-23 LAB — CBC
HCT: 39.7 % (ref 36.0–46.0)
Hemoglobin: 13.3 g/dL (ref 12.0–15.0)
MCH: 30 pg (ref 26.0–34.0)
MCHC: 33.5 g/dL (ref 30.0–36.0)
MCV: 89.6 fL (ref 80.0–100.0)
Platelets: 328 10*3/uL (ref 150–400)
RBC: 4.43 MIL/uL (ref 3.87–5.11)
RDW: 12.4 % (ref 11.5–15.5)
WBC: 13 10*3/uL — ABNORMAL HIGH (ref 4.0–10.5)
nRBC: 0 % (ref 0.0–0.2)

## 2020-08-23 MED ORDER — POTASSIUM CHLORIDE CRYS ER 20 MEQ PO TBCR
40.0000 meq | EXTENDED_RELEASE_TABLET | Freq: Once | ORAL | Status: AC
  Administered 2020-08-23: 40 meq via ORAL
  Filled 2020-08-23: qty 2

## 2020-08-23 NOTE — Progress Notes (Signed)
PT Cancellation Note  Patient Details Name: [name] [name] MRN: [id number] DOB: 06/22/1991   Cancelled Treatment:    Reason Eval/Treat Not Completed: Fatigue/lethargy limiting ability to participate Pt's mom reports pt is finally sleeping well right now and requests PT to check back later.   Bridger Pizzi,KATHrine E 08/23/2020, 10:30 AM Paulino Door, DPT Acute Rehabilitation Services Pager: [phone] Office: [phone]

## 2020-08-23 NOTE — Plan of Care (Signed)
°  Problem: Education: °Goal: Knowledge of General Education information will improve °Description: Including pain rating scale, medication(s)/side effects and non-pharmacologic comfort measures °Outcome: Progressing °  °Problem: Health Behavior/Discharge Planning: °Goal: Ability to manage health-related needs will improve °Outcome: Progressing °  °Problem: Clinical Measurements: °Goal: Will remain free from infection °Outcome: Progressing °Goal: Respiratory complications will improve °Outcome: Progressing °Goal: Cardiovascular complication will be avoided °Outcome: Progressing °  °Problem: Activity: °Goal: Risk for activity intolerance will decrease °Outcome: Progressing °  °Problem: Nutrition: °Goal: Adequate nutrition will be maintained °Outcome: Progressing °  °Problem: Coping: °Goal: Level of anxiety will decrease °Outcome: Progressing °  °Problem: Elimination: °Goal: Will not experience complications related to bowel motility °Outcome: Progressing °Goal: Will not experience complications related to urinary retention °Outcome: Progressing °  °Problem: Pain Managment: °Goal: General experience of comfort will improve °Outcome: Progressing °  °Problem: Safety: °Goal: Ability to remain free from injury will improve °Outcome: Progressing °  °Problem: Skin Integrity: °Goal: Risk for impaired skin integrity will decrease °Outcome: Progressing °  °

## 2020-08-23 NOTE — Progress Notes (Signed)
Please see consult note from yesterday for full details.  Suspect serotonin syndrome, though UTI plus psychogenic episodes is also possible.  No antiepileptics are currently indicated, or driving probations given that she never lost consciousness. Defer to psychiatry on resumption of serotonergic agents.  Neurology will be available on an as-needed basis moving forward.  Ritta Slot, MD [facility] [phone]  If 7pm- 7am, please page neurology on call as listed in [facility].

## 2020-08-23 NOTE — Procedures (Signed)
Patient Name: [name] [name]  MRN: [id number]  Epilepsy Attending: Charlsie Quest  Referring Physician/Provider: Dr Pamella Pert Date: 08/23/2020 Duration: 21.25 mins  Patient history: 29 year old female with recurrent spasms of unclear etiology, confusion, fever, leukocytosis. EEG to evaluate for seizure.  Level of alertness: Awake  AEDs during EEG study: None  Technical aspects: This EEG study was done with scalp electrodes positioned according to the 10-20 International system of electrode placement. Electrical activity was acquired at a sampling rate of 500Hz  and reviewed with a high frequency filter of 70Hz  and a low frequency filter of 1Hz . EEG data were recorded continuously and digitally stored.   Description: The posterior dominant rhythm consists of 8-9Hz  activity of moderate voltage (25-35 uV) seen predominantly in posterior head regions, symmetric and reactive to eye opening and eye closing. Hyperventilation did not show any EEG change.  Physiologic photic driving was seen during photic stimulation.       IMPRESSION: This study is within normal limits. No seizures or epileptiform discharges were seen throughout the recording.  Douglas Rooks 

## 2020-08-23 NOTE — Plan of Care (Signed)
  Problem: Education: Goal: Knowledge of General Education information will improve Description: Including pain rating scale, medication(s)/side effects and non-pharmacologic comfort measures Outcome: Progressing   Problem: Clinical Measurements: Goal: Will remain free from infection Outcome: Progressing Goal: Respiratory complications will improve Outcome: Progressing   Problem: Activity: Goal: Risk for activity intolerance will decrease Outcome: Progressing   
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
## 2020-08-23 NOTE — Progress Notes (Signed)
EEG Completed; Results Pending  
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
## 2020-08-24 DIAGNOSIS — F23 Brief psychotic disorder: Secondary | ICD-10-CM | POA: Diagnosis present

## 2020-08-24 LAB — TSH: TSH: 1.532 u[IU]/mL (ref 0.350–4.500)

## 2020-08-24 MED ORDER — PROMETHAZINE HCL 25 MG/ML IJ SOLN
6.2500 mg | Freq: Four times a day (QID) | INTRAMUSCULAR | Status: DC | PRN
  Administered 2020-08-25 – 2020-08-26 (×2): 6.25 mg via INTRAVENOUS
  Filled 2020-08-24 (×3): qty 1

## 2020-08-24 NOTE — Discharge Summary (Addendum)
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 expected result is Negative.  Fact Sheet for Patients:  EntrepreneurPulse.com.au  Fact Sheet for Healthcare Providers:  IncredibleEmployment.be  This test is no t yet approved or cleared by the [place] FDA and  has been authorized for detection and/or diagnosis of SARS-CoV-2 by FDA under an Emergency Use Authorization (EUA). This EUA will remain  in effect (meaning this test can be used) for the duration of the COVID-19 declaration under Section 564(b)(1) of the Act, 21 U.S.C.section 360bbb-3(b)(1), unless the authorization is terminated  or revoked sooner.       Influenza A by PCR NEGATIVE NEGATIVE Final   Influenza B by PCR NEGATIVE NEGATIVE Final    Comment: (NOTE) The Xpert Xpress SARS-CoV-2/FLU/RSV plus assay is intended as an aid in the diagnosis of influenza from Nasopharyngeal swab specimens and should not be used as a sole basis for treatment. Nasal washings and aspirates are unacceptable for Xpert Xpress  SARS-CoV-2/FLU/RSV testing.  Fact Sheet for Patients: EntrepreneurPulse.com.au  Fact Sheet for Healthcare Providers: IncredibleEmployment.be  This test is not yet approved or cleared by the [place] FDA and has been authorized for detection and/or diagnosis of SARS-CoV-2 by FDA under an Emergency Use Authorization (EUA). This EUA will remain in effect (meaning this test can be used) for the duration of the COVID-19 declaration under Section 564(b)(1) of the Act, 21 U.S.C. section 360bbb-3(b)(1), unless the authorization is terminated or revoked.  Performed at [facility], [place] [address], [place], [place] [address]   Blood culture (routine x 2)     Status: None (Preliminary result)   Collection Time: 08/21/20  1:34 AM   Specimen: BLOOD  Result Value Ref Range Status   Specimen Description   Final    BLOOD LEFT ANTECUBITAL Performed at [facility] [address], [place], [place] [address]    Special Requests   Final    BOTTLES DRAWN AEROBIC AND ANAEROBIC Blood Culture adequate volume Performed at [facility] [address], [place], [place] [address]    Culture   Final    NO GROWTH 4 DAYS Performed at [facility], [place] [address] [place], [place] [address]    Report Status PENDING  Incomplete  Blood culture (routine x 2)     Status: None (Preliminary result)   Collection Time: 08/21/20  1:34 AM   Specimen: BLOOD  Result Value Ref Range Status   Specimen Description   Final    BLOOD RIGHT ANTECUBITAL Performed at [facility] [address], [place], [place] [address]    Special Requests   Final  BOTTLES DRAWN AEROBIC AND ANAEROBIC Blood Culture adequate volume Performed at [facility] [address] [place], [place] [address]    Culture   Final    NO GROWTH 4 DAYS Performed at [facility], [place] [address], [place], [place] [address]    Report Status PENDING  Incomplete  CSF culture     Status: None   Collection Time: 08/21/20 12:45 PM   Specimen: CSF; Cerebrospinal Fluid  Result Value Ref Range Status   Specimen Description   Final    CSF Performed at [facility] [address], [place], [place] [address]    Special Requests   Final    NONE Performed at [facility], [place] [address] [place], [place] [address]    Gram Stain   Final    CYTOSPIN SMEAR NO WBC SEEN NO ORGANISMS SEEN Gram Stain Report Called to,Read Back By and Verified With: WOODY,A. RN $[id number]$  ON 12.07.2021 BY COHEN,K Performed at [facility], [place] [address], [place], [place] [address]    Culture   Final    NO GROWTH Performed at [facility], [place] [address], [place], [place] [address]    Report Status 08/25/2020 FINAL  Final     Labs: Basic Metabolic Panel: Recent Labs  Lab 08/21/20 0412 08/22/20 0501 08/23/20 0753  NA 141  --  138  K 3.5  --  3.2*  CL 105  --  105  CO2 25  --  23  GLUCOSE 151*  --  124*  BUN 13  --  7  CREATININE 1.01* 0.86 0.87  CALCIUM 9.3  --  8.8*   Liver Function Tests: Recent Labs  Lab 08/21/20 0412 08/23/20 0753  AST 30 77*  ALT 26 48*  ALKPHOS 96 96  BILITOT 1.3* 1.0  PROT 8.4* 7.3  ALBUMIN 4.7 4.0   CBC: Recent Labs  Lab 08/21/20 0412 08/23/20 0753  WBC 21.0* 13.0*  HGB 13.7 13.3  HCT 41.0 39.7  MCV 89.7 89.6  PLT 381 328   CBG: No results for input(s): GLUCAP in the last 168 hours. Hgb A1c No results for input(s): HGBA1C in the last 72 hours. Lipid Profile No results for input(s): CHOL, HDL, LDLCALC, TRIG, CHOLHDL, LDLDIRECT in the last 72 hours. Thyroid function studies Recent Labs    08/24/20 1058  TSH 1.532   Urinalysis    Component Value Date/Time   COLORURINE YELLOW 08/20/2020 2200   APPEARANCEUR CLOUDY (A) 08/20/2020 2200   LABSPEC 1.021 08/20/2020 2200   PHURINE 5.0  08/20/2020 2200   GLUCOSEU NEGATIVE 08/20/2020 2200   HGBUR NEGATIVE 08/20/2020 2200   BILIRUBINUR NEGATIVE 08/20/2020 2200   BILIRUBINUR negative 06/29/2017 1200   KETONESUR NEGATIVE 08/20/2020 2200   PROTEINUR 100 (A) 08/20/2020 2200   UROBILINOGEN 0.2 07/05/2019 0828   NITRITE NEGATIVE 08/20/2020 2200   LEUKOCYTESUR SMALL (A) 08/20/2020 2200    FURTHER DISCHARGE INSTRUCTIONS:   Get Medicines reviewed and adjusted: Please take all your medications with you for your next visit with your Primary MD   Laboratory/radiological data: Please request your Primary MD to go over all hospital tests and procedure/radiological results at the follow up, please ask your Primary MD to get all Hospital records sent to his/her office.   In some cases, they will be blood work, cultures and biopsy results pending at the time of your discharge. Please request that your primary care M.D. goes through all the  records of your hospital data and follows up on these results.   Also Note the following: If you experience worsening of your admission symptoms, develop shortness of breath, life threatening emergency, suicidal or homicidal thoughts you must seek medical attention immediately by calling 911 or calling your MD immediately  if symptoms less severe.   You must read complete instructions/literature along with all the possible adverse reactions/side effects for all the Medicines you take and that have been prescribed to you. Take any new Medicines after you have completely understood and accpet all the possible adverse reactions/side effects.    Do not drive when taking Pain medications or sleeping medications (Benzodaizepines)   Do not take more than prescribed Pain, Sleep and Anxiety Medications. It is not advisable to combine anxiety,sleep and pain medications without talking with your primary care practitioner   Special Instructions: If you have smoked or chewed Tobacco  in the last 2 yrs please stop  smoking, stop any regular Alcohol  and or any Recreational drug use.   Wear Seat belts while driving.   Please note: You were cared for by a hospitalist during your hospital stay. Once you are discharged, your primary care physician will handle any further medical issues. Please note that NO REFILLS for any discharge medications will be authorized once you are discharged, as it is imperative that you return to your primary care physician (or establish a relationship with a primary care physician if you do not have one) for your post hospital discharge needs so that they can reassess your need for medications and monitor your lab values.  Time coordinating discharge: 35 minutes  SIGNED:  Marzetta Board, MD, PhD 08/26/2020, 11:22 AM
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
## 2020-08-24 NOTE — TOC Progression Note (Signed)
Transition of Care [facility]) - Progression Note    Patient Details  Name: [name] [name] MRN: [id number] Date of Birth: 07-21-1991  Transition of Care [facility]) CM/SW Contact  Norissa Bartee, Olegario Messier, RN Phone Number: 08/24/2020, 3:46 PM  Clinical Narrative:  [facility] referral sent await response.          Expected Discharge Plan and Services           Expected Discharge Date: 08/24/20                                     Social Determinants of Health (SDOH) Interventions    Readmission Risk Interventions No flowsheet data found.

## 2020-08-24 NOTE — Progress Notes (Signed)
CSW has referred patient to [facility] for possible bed placement.  Brooke MicNichol, [facility], relayed that there currently isn't an appropriate bed for this patient.   Ladoris Gene MSW,LCSWA,LCASA Clinical Social Worker  [place] Disposition [phone] (cell)

## 2020-08-24 NOTE — TOC Progression Note (Signed)
Transition of Care [facility]) - Progression Note    Patient Details  Name: [name] [name] MRN: [id number] Date of Birth: [birth year]  Transition of Care [facility]) CM/SW Contact  Faelynn Wynder, Olegario Messier, RN Phone Number: 08/24/2020, 4:50 PM  Clinical Narrative:Psych recc IP Psych-faxed referral to:[facility]-rep Cherre Huger aware(concerned about PT/1:/[facility]-await response.           Expected Discharge Plan and Services           Expected Discharge Date: 08/24/20                                     Social Determinants of Health (SDOH) Interventions    Readmission Risk Interventions No flowsheet data found.

## 2020-08-25 LAB — CSF CULTURE W GRAM STAIN: Culture: NO GROWTH

## 2020-08-25 MED ORDER — CIPROFLOXACIN HCL 500 MG PO TABS
500.0000 mg | ORAL_TABLET | Freq: Two times a day (BID) | ORAL | Status: DC
Start: 2020-08-25 — End: 2020-08-26

## 2020-08-25 MED ORDER — CIPROFLOXACIN HCL 500 MG PO TABS
500.0000 mg | ORAL_TABLET | Freq: Two times a day (BID) | ORAL | Status: DC
  Administered 2020-08-25 – 2020-08-26 (×3): 500 mg via ORAL
  Filled 2020-08-25 (×3): qty 1

## 2020-08-25 NOTE — Plan of Care (Signed)
  Problem: Education: Goal: Knowledge of General Education information will improve Description: Including pain rating scale, medication(s)/side effects and non-pharmacologic comfort measures 08/25/2020 1751 by Sandre Kitty, RN Outcome: Progressing 08/25/2020 1751 by Sandre Kitty, RN Outcome: Progressing   Problem: Health Behavior/Discharge Planning: Goal: Ability to manage health-related needs will improve 08/25/2020 1751 by Sandre Kitty, RN Outcome: Progressing 08/25/2020 1751 by Sandre Kitty, RN Outcome: Progressing   Problem: Clinical Measurements: Goal: Ability to maintain clinical measurements within normal limits will improve 08/25/2020 1751 by Sandre Kitty, RN Outcome: Progressing 08/25/2020 1751 by Sandre Kitty, RN Outcome: Progressing Goal: Will remain free from infection 08/25/2020 1751 by Sandre Kitty, RN Outcome: Progressing 08/25/2020 1751 by Sandre Kitty, RN Outcome: Progressing Goal: Diagnostic test results will improve 08/25/2020 1751 by Sandre Kitty, RN Outcome: Progressing 08/25/2020 1751 by Sandre Kitty, RN Outcome: Progressing Goal: Respiratory complications will improve 08/25/2020 1751 by Sandre Kitty, RN Outcome: Progressing 08/25/2020 1751 by Sandre Kitty, RN Outcome: Progressing Goal: Cardiovascular complication will be avoided 08/25/2020 1751 by Sandre Kitty, RN Outcome: Progressing 08/25/2020 1751 by Sandre Kitty, RN Outcome: Progressing   Problem: Activity: Goal: Risk for activity intolerance will decrease 08/25/2020 1751 by Sandre Kitty, RN Outcome: Progressing 08/25/2020 1751 by Sandre Kitty, RN Outcome: Progressing   Problem: Nutrition: Goal: Adequate nutrition will be maintained 08/25/2020 1751 by Sandre Kitty, RN Outcome: Progressing 08/25/2020 1751 by Sandre Kitty, RN Outcome: Progressing   Problem: Coping: Goal: Level of anxiety will decrease 08/25/2020  1751 by Sandre Kitty, RN Outcome: Progressing 08/25/2020 1751 by Sandre Kitty, RN Outcome: Progressing   Problem: Elimination: Goal: Will not experience complications related to bowel motility 08/25/2020 1751 by Sandre Kitty, RN Outcome: Progressing 08/25/2020 1751 by Sandre Kitty, RN Outcome: Progressing Goal: Will not experience complications related to urinary retention 08/25/2020 1751 by Sandre Kitty, RN Outcome: Progressing 08/25/2020 1751 by Sandre Kitty, RN Outcome: Progressing   Problem: Pain Managment: Goal: General experience of comfort will improve 08/25/2020 1751 by Sandre Kitty, RN Outcome: Progressing 08/25/2020 1751 by Sandre Kitty, RN Outcome: Progressing   Problem: Safety: Goal: Ability to remain free from injury will improve 08/25/2020 1751 by Sandre Kitty, RN Outcome: Progressing 08/25/2020 1751 by Sandre Kitty, RN Outcome: Progressing   Problem: Skin Integrity: Goal: Risk for impaired skin integrity will decrease 08/25/2020 1751 by Sandre Kitty, RN Outcome: Progressing 08/25/2020 1751 by Sandre Kitty, RN Outcome: Progressing

## 2020-08-25 NOTE — TOC Progression Note (Addendum)
Transition of Care [facility]) - Progression Note    Patient Details  Name: [name] [name] MRN: [id number] Date of Birth: 1991/03/08  Transition of Care [facility]) CM/SW Contact  Clearance Coots, LCSW Phone Number: 08/25/2020, 5:27 PM  Clinical Narrative:  \ Re: psych placement   [facility]-no appropriate bed [facility], no beds.  [facility]- not accepting patients today.  [facility]- No bed today [facility]-unable to reach staff [facility]-unable to reach staff.   TOC staff will continue search.     Barriers to Discharge: Psych Bed not available  Expected Discharge Plan and Services           Expected Discharge Date: 08/24/20                                     Social Determinants of Health (SDOH) Interventions    Readmission Risk Interventions No flowsheet data found.

## 2020-08-25 NOTE — Progress Notes (Signed)
Patient seen this morning, remains medically ready when a psychiatric bed becomes available.  Discharge summary updated.  [name] [name] M. Elvera Lennox, MD, PhD [facility]  Between 7 am - 7 pm you can contact me via Amion or Securechat.  I am not available 7 pm - 7 am, please contact night coverage MD/APP via Amion

## 2020-08-26 LAB — CULTURE, BLOOD (ROUTINE X 2)
Culture: NO GROWTH
Culture: NO GROWTH
Special Requests: ADEQUATE
Special Requests: ADEQUATE

## 2020-08-26 MED ORDER — CIPROFLOXACIN HCL 500 MG PO TABS: 500.0000 mg | ORAL_TABLET | Freq: Two times a day (BID) | ORAL | 0 refills | Status: AC

## 2020-08-26 MED ORDER — QUETIAPINE FUMARATE 25 MG PO TABS
25.0000 mg | ORAL_TABLET | Freq: Two times a day (BID) | ORAL | 0 refills | Status: DC
Start: 2020-08-26 — End: 2020-09-20

## 2020-08-26 MED ORDER — LORAZEPAM 0.5 MG PO TABS: 0.5000 mg | ORAL_TABLET | Freq: Three times a day (TID) | ORAL | 0 refills | Status: DC | PRN

## 2020-08-26 NOTE — Consult Note (Signed)
[facility] Face-to-Face Psychiatry Consult   Reason for Consult: Depression, anxiety, hallucinations, paranoia Referring Physician: Dr. Elvera Lennox Patient Identification: [name] [name] MRN:  [id number] Principal Diagnosis: Sepsis [facility]) Diagnosis:  Principal Problem:   Sepsis (HCC) Active Problems:   ADHD (attention deficit hyperactivity disorder)   GAD (generalized anxiety disorder)   Acute metabolic encephalopathy   Brief psychotic disorder (HCC)   Total Time spent with patient: 30 minutes  Subjective: "I am doing pretty good today.''  Objective: Patient seen today in her room in the presence of her mother. She reports history significant forADHD, and panic attacks but was admitted due to confusion, agitatio, abnormal muscle twitching and spasms as well as fever. Today, patient is alert, awake, cooperative, pleasant, denies  depression, anxiety, psychosis, paranoia and self harming thoughts. Mother reports that patient is at her baseline function. Patient reports that she has a follow up appointment with her psychiatrist at [facility] in [place].  Past Psychiatric History: ADHD and depression.  Currently taking Adderall 20 mg p.o. twice daily, fluoxetine 30 mg p.o. daily.  She is currently receiving psychiatric services from [facility] [place].  Historical chart review shows patient has been being managed in this area for quite some time with her ADHD.  She denies any previous suicide attempt, and or self injurious behavior.  She denies any inpatient admission.   Risk to Self:  denies Risk to Others:  Denies Prior Inpatient Therapy:  Denies Prior Outpatient Therapy:  [facility]  Past Medical History:  Past Medical History:  Diagnosis Date  . ADHD (attention deficit hyperactivity disorder)   . Anxiety   . Panic attacks   . Vaginal Pap smear, abnormal     Past Surgical History:  Procedure Laterality Date  . DILATION AND CURETTAGE OF UTERUS  01/04/16  .  DILATION AND EVACUATION N/A 01/04/2016   Procedure: DILATATION AND EVACUATION;  Surgeon: Candice Camp, MD;  Location: [facility];  Service: Gynecology;  Laterality: N/A;  . GUM SURGERY  01/2017  . INDUCED ABORTION    . wisdom teeth removal Bilateral    2010   Family History:  Family History  Problem Relation Age of Onset  . Hypertension Mother   . Heart disease Father   . Cancer Father        testicular  . Hypertension Father   . Hyperlipidemia Father   . Heart disease Paternal Grandfather    Family Psychiatric  History: Family history obtained from mother, who reported paternal aunt has a history of schizophrenia, with multiple suicide attempts.  Paternal aunt died by [facility], it remains unknown if this was intentional = suicide completion.  Mother also reports father has a dual diagnosis of narcissism and substance use, she reports he also has symptoms of bipolar and or schizophrenia.  Mother reports father has been unproductive since the 24s.  Social History:  Social History   Substance and Sexual Activity  Alcohol Use No  . Alcohol/week: 2.0 standard drinks  . Types: 2 Standard drinks or equivalent per week   Comment: socially, not with preg     Social History   Substance and Sexual Activity  Drug Use No    Social History   Socioeconomic History  . Marital status: Married    Spouse name: Not on file  . Number of children: Not on file  . Years of education: Not on file  . Highest education level: Not on file  Occupational History  . Not on file  Tobacco  Use  . Smoking status: Former Smoker    Types: Cigarettes    Quit date: 08/15/2013    Years since quitting: 7.0  . Smokeless tobacco: Never Used  Substance and Sexual Activity  . Alcohol use: No    Alcohol/week: 2.0 standard drinks    Types: 2 Standard drinks or equivalent per week    Comment: socially, not with preg  . Drug use: No  . Sexual activity: Not Currently    Birth control/protection: I.U.D.  Other Topics  Concern  . Not on file  Social History Narrative  . Not on file   Social Determinants of Health   Financial Resource Strain: Not on file  Food Insecurity: Not on file  Transportation Needs: Not on file  Physical Activity: Not on file  Stress: Not on file  Social Connections: Not on file   Additional Social History:    Allergies:   Allergies  Allergen Reactions  . Ceclor [Cefaclor] Hives    Age 73 mild rash Tolerates ceftriaxone  . Latex Other (See Comments)    Irritation on skin    Labs:  No results found for this or any previous visit (from the past 48 hour(s)).  Current Facility-Administered Medications  Medication Dose Route Frequency Provider Last Rate Last Admin  . ciprofloxacin (CIPRO) tablet 500 mg  500 mg Oral BID Leatha Gilding, MD   500 mg at 08/26/20 0803  . LORazepam (ATIVAN) injection 1 mg  1 mg Intravenous Q4H PRN Rometta Emery, MD   1 mg at 08/26/20 0803  . promethazine (PHENERGAN) injection 6.25 mg  6.25 mg Intravenous Q6H PRN Leatha Gilding, MD   6.25 mg at 08/26/20 0556  . QUEtiapine (SEROQUEL) tablet 25 mg  25 mg Oral BID Maryagnes Amos, FNP   25 mg at 08/26/20 0803    Musculoskeletal: Strength & Muscle Tone: within normal limits Gait & Station: normal Patient leans: N/A  Psychiatric Specialty Exam: Physical Exam Psychiatric:        Attention and Perception: Attention and perception normal.        Speech: Speech normal.        Behavior: Behavior normal. Behavior is cooperative.        Thought Content: Thought content normal.        Cognition and Memory: Cognition and memory normal.        Judgment: Judgment normal.     Review of Systems  Constitutional: Negative.   HENT: Negative.   Psychiatric/Behavioral: Negative.     Blood pressure 125/89, pulse (!) 124, temperature 99.5 F (37.5 C), temperature source Oral, resp. rate 18, height 5\' 8"  (1.727 m), weight 90.7 kg, SpO2 99 %.Body mass index is 30.41 kg/m.  General  Appearance: Casual  Eye Contact:  Good  Speech:  Clear and Coherent  Volume:  Normal  Mood:  Anxious, Depressed, Dysphoric, Euphoric, Hopeless, Irritable and Worthless  Affect:  Appropriate  Thought Process:  Coherent and Linear  Orientation:  Full (Time, Place, and Person)  Thought Content:  Logical  Suicidal Thoughts:  No making suicidal statements, can contract for safety  Homicidal Thoughts:  No  Memory:  Immediate;   Good Recent;   Good Remote;   Good  Judgement:  Intact  Insight:  Good  Psychomotor Activity:  Normal  Concentration:  Concentration: Good and Attention Span: Good  Recall:  Good  Fund of Knowledge:  Good  Language:  Good  Akathisia:  No  Handed:  Right  AIMS (  if indicated):     Assets:  Communication Skills Desire for Improvement Financial Resources/Insurance Leisure Time Physical Health Social Support  ADL's:  Impaired  Cognition:  WNL  Sleep:   good    Treatment Plan Summary: -Patient is stable for discharge home today. -Continue Seroquel 25 mg bid -Refer patient to [facility] psychiatric clinic upon discharge for outpatient follow up.  Disposition: No evidence of imminent risk to self or others at present.   Patient does not meet criteria for psychiatric inpatient admission. Supportive therapy provided about ongoing stressors. Psychiatric service signing off  Thedore Mins, MD 08/26/2020 11:10 AM

## 2020-08-26 NOTE — Progress Notes (Addendum)
V.O received to administer ativan 1 mg IV. Medication tolerated well. Patient discharge instructions reviewed, questions concern denied at this time. No change from am assessment. Pt stable at discharge, a&ox4, ambulatory without assistance.

## 2020-08-30 ENCOUNTER — Emergency Department (HOSPITAL_COMMUNITY)
Admission: EM | Admit: 2020-08-30 | Discharge: 2020-08-30 | Disposition: A | Discharge status: Home / Self Care | Payer: BC Managed Care – PPO | Attending: Emergency Medicine | Admitting: Emergency Medicine

## 2020-08-30 ENCOUNTER — Other Ambulatory Visit: Payer: Self-pay

## 2020-08-30 ENCOUNTER — Encounter (HOSPITAL_COMMUNITY): Payer: Self-pay

## 2020-08-30 DIAGNOSIS — G40802 Other epilepsy, not intractable, without status epilepticus: Secondary | ICD-10-CM | POA: Diagnosis not present

## 2020-08-30 DIAGNOSIS — Z91040 Latex allergy status: Secondary | ICD-10-CM | POA: Diagnosis not present

## 2020-08-30 DIAGNOSIS — G4089 Other seizures: Secondary | ICD-10-CM | POA: Diagnosis not present

## 2020-08-30 DIAGNOSIS — F419 Anxiety disorder, unspecified: Secondary | ICD-10-CM | POA: Diagnosis not present

## 2020-08-30 DIAGNOSIS — R569 Unspecified convulsions: Secondary | ICD-10-CM | POA: Diagnosis not present

## 2020-08-30 DIAGNOSIS — G4489 Other headache syndrome: Secondary | ICD-10-CM | POA: Diagnosis not present

## 2020-08-30 DIAGNOSIS — Z87891 Personal history of nicotine dependence: Secondary | ICD-10-CM | POA: Diagnosis not present

## 2020-08-30 LAB — CBC WITH DIFFERENTIAL/PLATELET
Abs Immature Granulocytes: 0.19 10*3/uL — ABNORMAL HIGH (ref 0.00–0.07)
Basophils Absolute: 0.1 10*3/uL (ref 0.0–0.1)
Basophils Relative: 0 %
Eosinophils Absolute: 0 10*3/uL (ref 0.0–0.5)
Eosinophils Relative: 0 %
HCT: 43.9 % (ref 36.0–46.0)
Hemoglobin: 14.5 g/dL (ref 12.0–15.0)
Immature Granulocytes: 2 %
Lymphocytes Relative: 16 %
Lymphs Abs: 1.9 10*3/uL (ref 0.7–4.0)
MCH: 29.6 pg (ref 26.0–34.0)
MCHC: 33 g/dL (ref 30.0–36.0)
MCV: 89.6 fL (ref 80.0–100.0)
Monocytes Absolute: 0.6 10*3/uL (ref 0.1–1.0)
Monocytes Relative: 5 %
Neutro Abs: 9 10*3/uL — ABNORMAL HIGH (ref 1.7–7.7)
Neutrophils Relative %: 77 %
Platelets: 480 10*3/uL — ABNORMAL HIGH (ref 150–400)
RBC: 4.9 MIL/uL (ref 3.87–5.11)
RDW: 12.4 % (ref 11.5–15.5)
WBC: 11.7 10*3/uL — ABNORMAL HIGH (ref 4.0–10.5)
nRBC: 0 % (ref 0.0–0.2)

## 2020-08-30 LAB — URINALYSIS, ROUTINE W REFLEX MICROSCOPIC
Bilirubin Urine: NEGATIVE
Glucose, UA: NEGATIVE mg/dL
Hgb urine dipstick: NEGATIVE
Ketones, ur: 5 mg/dL — AB
Leukocytes,Ua: NEGATIVE
Nitrite: NEGATIVE
Protein, ur: 100 mg/dL — AB
Specific Gravity, Urine: 1.025 (ref 1.005–1.030)
pH: 5 (ref 5.0–8.0)

## 2020-08-30 LAB — CBG MONITORING, ED: Glucose-Capillary: 148 mg/dL — ABNORMAL HIGH (ref 70–99)

## 2020-08-30 LAB — I-STAT BETA HCG BLOOD, ED (MC, WL, AP ONLY): I-stat hCG, quantitative: <5 m[IU]/mL (ref <5)

## 2020-08-30 LAB — BASIC METABOLIC PANEL
Anion gap: 14 (ref 5–15)
BUN: 8 mg/dL (ref 6–20)
CO2: 22 mmol/L (ref 22–32)
Calcium: 9.7 mg/dL (ref 8.9–10.3)
Chloride: 103 mmol/L (ref 98–111)
Creatinine, Ser: 1.01 mg/dL — ABNORMAL HIGH (ref 0.44–1.00)
GFR, Estimated: >60 mL/min (ref >60)
Glucose, Bld: 140 mg/dL — ABNORMAL HIGH (ref 70–99)
Potassium: 3.6 mmol/L (ref 3.5–5.1)
Sodium: 139 mmol/L (ref 135–145)

## 2020-08-30 MED ORDER — SODIUM CHLORIDE 0.9 % IV BOLUS
1000.0000 mL | Freq: Once | INTRAVENOUS | Status: AC
  Administered 2020-08-30: 1000 mL via INTRAVENOUS

## 2020-08-30 MED ORDER — LORAZEPAM 0.5 MG PO TABS: 0.5000 mg | ORAL_TABLET | Freq: Three times a day (TID) | ORAL | 0 refills | Status: DC | PRN

## 2020-08-30 MED ORDER — LORAZEPAM 0.5 MG PO TABS
0.5000 mg | ORAL_TABLET | Freq: Once | ORAL | Status: AC
  Administered 2020-08-30: 0.5 mg via ORAL
  Filled 2020-08-30: qty 1

## 2020-08-30 MED ORDER — PROMETHAZINE HCL 25 MG/ML IJ SOLN
12.5000 mg | Freq: Once | INTRAMUSCULAR | Status: AC
  Administered 2020-08-30: 12.5 mg via INTRAVENOUS
  Filled 2020-08-30: qty 1

## 2020-08-30 MED ORDER — KETOROLAC TROMETHAMINE 30 MG/ML IJ SOLN
30.0000 mg | Freq: Once | INTRAMUSCULAR | Status: AC
  Administered 2020-08-30: 30 mg via INTRAVENOUS
  Filled 2020-08-30: qty 1

## 2020-08-30 MED ORDER — ONDANSETRON HCL 4 MG/2ML IJ SOLN: 4.0000 mg | Freq: Once | INTRAMUSCULAR | Status: DC

## 2020-08-30 NOTE — ED Notes (Signed)
Patient requesting for this RN to take her IV out. Patient does not want to finish her IV fluids. Patient is ready to leave. EDP has spoken with patient and mother several times about lab work and discharge instructions. Patient denies N/V at this time. Patient did not have any occurences of emesis here in ED. Patient states I vomit at home because I am stressed out. Patient encouraged to hydrate at home with small sips instead of "chugging" like she has been and anxiety/stress reducers discussed with patient.

## 2020-08-30 NOTE — ED Provider Notes (Signed)
[facility]-EMERGENCY DEPT Provider Note   CSN: [id number] Arrival date & time: 08/30/20  0865     History Chief Complaint  Patient presents with  . Seizures    [name] [name] is a 29 y.o. female.  Patient with history of anxiety, panic attacks who presents the ED with anxiety.  States that she had seizure-like activity prior to arrival.  Was diagnosed with pseudoseizures at recent hospital stay.  Is on Ativan and took last dose yesterday.  Currently is supposed to follow-up with psychiatry tomorrow.  Does not have any more Ativan.  Otherwise she takes Seroquel.  She denies suicidal homicidal ideation.  The history is provided by the patient.  Seizures Seizure activity on arrival: no   Initial focality:  None Postictal symptoms: no confusion   Return to baseline: yes   Severity:  Mild Timing:  Once History of seizures: no        Past Medical History:  Diagnosis Date  . ADHD (attention deficit hyperactivity disorder)   . Anxiety   . Panic attacks   . Vaginal Pap smear, abnormal     Patient Active Problem List   Diagnosis Date Noted  . Brief psychotic disorder (HCC) 08/24/2020  . Sepsis (HCC) 08/21/2020  . Acute metabolic encephalopathy 08/21/2020  . Methadone maintenance therapy patient (HCC) 05/16/2019  . Insomnia 12/09/2016  . ADHD (attention deficit hyperactivity disorder) 10/27/2014  . GAD (generalized anxiety disorder) 10/27/2014  . Other acne 05/03/2008  . Allergic rhinitis 01/27/2008    Past Surgical History:  Procedure Laterality Date  . DILATION AND CURETTAGE OF UTERUS  01/04/16  . DILATION AND EVACUATION N/A 01/04/2016   Procedure: DILATATION AND EVACUATION;  Surgeon: Candice Camp, MD;  Location: [facility];  Service: Gynecology;  Laterality: N/A;  . GUM SURGERY  01/2017  . INDUCED ABORTION    . wisdom teeth removal Bilateral    2010     OB History    Gravida  3   Para  2   Term  2   Preterm      AB  1   Living  2      SAB      IAB  1   Ectopic      Multiple  0   Live Births  2           Family History  Problem Relation Age of Onset  . Hypertension Mother   . Heart disease Father   . Cancer Father        testicular  . Hypertension Father   . Hyperlipidemia Father   . Heart disease Paternal Grandfather     Social History   Tobacco Use  . Smoking status: Former Smoker    Types: Cigarettes    Quit date: 08/15/2013    Years since quitting: 7.0  . Smokeless tobacco: Never Used  Substance Use Topics  . Alcohol use: No    Alcohol/week: 2.0 standard drinks    Types: 2 Standard drinks or equivalent per week    Comment: socially, not with preg  . Drug use: No    Home Medications Prior to Admission medications   Medication Sig Start Date End Date Taking? Authorizing Provider  QUEtiapine (SEROQUEL) 25 MG tablet Take 1 tablet (25 mg total) by mouth 2 (two) times daily. 08/26/20  Yes Gherghe, Daylene Katayama, MD  albuterol (VENTOLIN HFA) 108 (90 Base) MCG/ACT inhaler Inhale 1-2 puffs into the lungs every 6 (six) hours as needed  for wheezing or shortness of breath. Patient not taking: Reported on 08/30/2020 01/03/20   Janeece Agee, NP  amphetamine-dextroamphetamine (ADDERALL) 20 MG tablet TAKE 1 TABLET BY MOUTH TWICE DAILY. Patient not taking: Reported on 08/30/2020 07/26/20   Mozingo, Thereasa Solo, NP  fluticasone (FLONASE) 50 MCG/ACT nasal spray USE 1 SPRAY IN EACH NOSTRIL TWICE A DAY. Patient not taking: Reported on 08/30/2020 07/25/20   Janeece Agee, NP  LORazepam (ATIVAN) 0.5 MG tablet Take 1 tablet (0.5 mg total) by mouth every 8 (eight) hours as needed for up to 6 doses for anxiety. 08/30/20   Virgina Norfolk, DO    Allergies    Ceclor [cefaclor], Latex, and Zofran [ondansetron]  Review of Systems   Review of Systems  Constitutional: Negative for chills and fever.  HENT: Negative for ear pain and sore throat.   Eyes: Negative for pain and visual disturbance.  Respiratory:  Negative for cough and shortness of breath.   Cardiovascular: Negative for chest pain and palpitations.  Gastrointestinal: Negative for abdominal pain and vomiting.  Genitourinary: Negative for dysuria and hematuria.  Musculoskeletal: Negative for arthralgias and back pain.  Skin: Negative for color change and rash.  Neurological: Negative for seizures and syncope.  Psychiatric/Behavioral: Negative for suicidal ideas. The patient is nervous/anxious.   All other systems reviewed and are negative.   Physical Exam Updated Vital Signs BP 124/81   Pulse (!) 114   Temp 98 F (36.7 C) (Oral)   Resp (!) 27   SpO2 99%   Physical Exam Vitals and nursing note reviewed.  Constitutional:      General: She is not in acute distress.    Appearance: She is well-developed and well-nourished.  HENT:     Head: Normocephalic and atraumatic.     Nose: Nose normal.     Mouth/Throat:     Mouth: Mucous membranes are moist.  Eyes:     Conjunctiva/sclera: Conjunctivae normal.     Pupils: Pupils are equal, round, and reactive to light.  Cardiovascular:     Rate and Rhythm: Normal rate and regular rhythm.     Heart sounds: No murmur heard.   Pulmonary:     Effort: Pulmonary effort is normal. No respiratory distress.     Breath sounds: Normal breath sounds.  Abdominal:     Palpations: Abdomen is soft.     Tenderness: There is no abdominal tenderness.  Musculoskeletal:        General: No edema.     Cervical back: Neck supple.  Skin:    General: Skin is warm and dry.  Neurological:     General: No focal deficit present.     Mental Status: She is alert and oriented to person, place, and time.     Cranial Nerves: No cranial nerve deficit.     Sensory: No sensory deficit.     Motor: No weakness.     Coordination: Coordination normal.     Comments: 5+ out of 5 strength throughout, normal sensation, no drift, normal finger-to-nose finger  Psychiatric:        Mood and Affect: Mood is anxious.  Affect is tearful.        Thought Content: Thought content does not include homicidal or suicidal ideation.     ED Results / Procedures / Treatments   Labs (all labs ordered are listed, but only abnormal results are displayed) Labs Reviewed  CBC WITH DIFFERENTIAL/PLATELET - Abnormal; Notable for the following components:      Result Value  WBC 11.7 (*)    Platelets 480 (*)    Neutro Abs 9.0 (*)    Abs Immature Granulocytes 0.19 (*)    All other components within normal limits  BASIC METABOLIC PANEL - Abnormal; Notable for the following components:   Glucose, Bld 140 (*)    Creatinine, Ser 1.01 (*)    All other components within normal limits  URINALYSIS, ROUTINE W REFLEX MICROSCOPIC - Abnormal; Notable for the following components:   APPearance CLOUDY (*)    Ketones, ur 5 (*)    Protein, ur 100 (*)    Bacteria, UA RARE (*)    All other components within normal limits  CBG MONITORING, ED - Abnormal; Notable for the following components:   Glucose-Capillary 148 (*)    All other components within normal limits  I-STAT BETA HCG BLOOD, ED ([facility], [facility], [facility] ONLY)    EKG EKG Interpretation  Date/Time:  Thursday August 30 2020 10:30:14 EST Ventricular Rate:  124 PR Interval:    QRS Duration: 83 QT Interval:  329 QTC Calculation: 473 R Axis:   70 Text Interpretation: Sinus tachycardia Minimal ST depression, inferior leads Baseline wander in lead(s) V5 Confirmed by Virgina Norfolk [phone]) on 08/30/2020 10:42:56 AM Also confirmed by Virgina Norfolk [phone]), editor Elita Quick ([id number])  on 08/30/2020 11:11:26 AM   Radiology No results found.  Procedures Procedures (including critical care time)  Medications Ordered in ED Medications  LORazepam (ATIVAN) tablet 0.5 mg (0.5 mg Oral Given 08/30/20 1027)  sodium chloride 0.9 % bolus 1,000 mL (0 mLs Intravenous Stopped 08/30/20 1200)  promethazine (PHENERGAN) injection 12.5 mg (12.5 mg Intravenous Given 08/30/20 1125)   ketorolac (TORADOL) 30 MG/ML injection 30 mg (30 mg Intravenous Given 08/30/20 1143)    ED Course  I have reviewed the triage vital signs and the nursing notes.  Pertinent labs & imaging results that were available during my care of the patient were reviewed by me and considered in my medical decision making (see chart for details).    MDM Rules/Calculators/A&P                          [name] [name] is a 29 year old female who presents to the ED with anxiety.  Patient with overall unremarkable vitals except for tachycardia.  Patient tearful and anxious on exam.  States that she is supposed to follow-up with psychiatry tomorrow.  She has been taking Ativan since being discharged from the hospital recently.  She is also taking Seroquel.  Upon chart review it appears that she had extensive hospital stay as she was having confusion and fever.  Possibly serotonin syndrome.  Had negative MRI of her brain and spine.  Negative LP.  Was started on Ativan.  In the end she did not have any abnormalities on EEG.  May be pseudoseizures.  Psychiatry evaluated her while she is inpatient and started treatment and she improved.  She was discharged on Ativan and Seroquel.  Overall she appears well other than she is feeling anxious.  We will give her dose of Ativan and give her a prescription for 1 more day of Ativan until she can follow-up with psychiatry tomorrow.  She does not endorse any suicidal homicidal ideation.  Will check basic labs and give her IV fluids.  She has had some diarrhea since being on antibiotics.  Lab work is overall reassuring.  No significant anemia, electrolyte abnormality, kidney injury.  Patient has improved greatly with Ativan.  She is given IV fluids with improvement as well.  She is given Toradol.  Mother requesting Phenergan for nausea but given that she had recent possible serotonin syndrome will not prescribe this medication.  Patient is having some intermittent nausea but no active  vomiting.  Believe Ativan will also be helpful with any nausea symptoms she may be having.  She is able to eat and drink without any issues.  Overall she says she is safe at home.  She denies suicidal homicidal ideation.  I talked with psychiatry on the phone and stated that I was going to extend Ativan for 1 more day as she does have follow-up with her psychologist tomorrow to further talk about medication adjustments.  They were okay with this plan this patient has been taking Seroquel and Ativan with overall good improvement.  Overall today believe patient had a panic attack/pseudoseizure.  Lab work is reassuring and exam is reassuring.  She understands return precautions and stressed that if she felt suicidal homicidal she should return to the ED.  Patient was discharged in ED in good condition.  I believe patient is overall safe.  Mother is unhappy with me about not prescribing Phenergan but at this time does not appear to be needed or appropriate medication.  Recommend that she talk with her primary care doctor about further adjustments to any medications she may need as well as psychiatry/psychology.  This chart was dictated using voice recognition software.  Despite best efforts to proofread,  errors can occur which can change the documentation meaning.   Final Clinical Impression(s) / ED Diagnoses Final diagnoses:  Anxiety    Rx / DC Orders ED Discharge Orders         Ordered    LORazepam (ATIVAN) 0.5 MG tablet  Every 8 hours PRN        08/30/20 1156           Soul Deveney, Madelaine Bhatdam, DO 08/30/20 1201

## 2020-08-30 NOTE — ED Notes (Signed)
Seizure pads placed on pt bed rails. Pt placed on cardiac monitoring. CBG 148.

## 2020-08-30 NOTE — ED Triage Notes (Signed)
Pt arrived via EMS, seizure like activity x3 mins, witnessed by family, while pt was in bathtub. C/o upper back pain. Recently dx with non epileptic pseudoseizures.   Pt aox4 in traige.   142 CBG

## 2020-08-31 ENCOUNTER — Other Ambulatory Visit: Payer: Self-pay | Admitting: Adult Health

## 2020-08-31 ENCOUNTER — Ambulatory Visit (INDEPENDENT_AMBULATORY_CARE_PROVIDER_SITE_OTHER): Payer: BC Managed Care – PPO | Admitting: Mental Health

## 2020-08-31 DIAGNOSIS — F331 Major depressive disorder, recurrent, moderate: Secondary | ICD-10-CM | POA: Diagnosis not present

## 2020-08-31 DIAGNOSIS — F411 Generalized anxiety disorder: Secondary | ICD-10-CM | POA: Diagnosis not present

## 2020-08-31 MED ORDER — LORAZEPAM 0.5 MG PO TABS
ORAL_TABLET | ORAL | 0 refills | Status: DC
Start: 2020-08-31 — End: 2020-09-04
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
## 2020-09-04 ENCOUNTER — Other Ambulatory Visit: Payer: Self-pay

## 2020-09-04 ENCOUNTER — Telehealth: Payer: Self-pay | Admitting: Adult Health

## 2020-09-04 ENCOUNTER — Ambulatory Visit
Admission: RE | Admit: 2020-09-04 | Discharge: 2020-09-04 | Disposition: A | Discharge status: Home / Self Care | Payer: BC Managed Care – PPO | Source: Ambulatory Visit | Attending: Nurse Practitioner | Admitting: Nurse Practitioner

## 2020-09-04 ENCOUNTER — Encounter: Payer: Self-pay | Admitting: Nurse Practitioner

## 2020-09-04 ENCOUNTER — Ambulatory Visit (INDEPENDENT_AMBULATORY_CARE_PROVIDER_SITE_OTHER): Payer: BC Managed Care – PPO | Admitting: Nurse Practitioner

## 2020-09-04 ENCOUNTER — Other Ambulatory Visit: Payer: Self-pay | Admitting: Adult Health

## 2020-09-04 ENCOUNTER — Ambulatory Visit (INDEPENDENT_AMBULATORY_CARE_PROVIDER_SITE_OTHER): Payer: BC Managed Care – PPO | Admitting: Mental Health

## 2020-09-04 VITALS — BP 122/80 | Temp 98.6°F | Ht 66.6 in | Wt 216.8 lb

## 2020-09-04 DIAGNOSIS — F331 Major depressive disorder, recurrent, moderate: Secondary | ICD-10-CM

## 2020-09-04 DIAGNOSIS — F411 Generalized anxiety disorder: Secondary | ICD-10-CM

## 2020-09-04 DIAGNOSIS — R059 Cough, unspecified: Secondary | ICD-10-CM

## 2020-09-04 DIAGNOSIS — Z23 Encounter for immunization: Secondary | ICD-10-CM

## 2020-09-04 DIAGNOSIS — R0602 Shortness of breath: Secondary | ICD-10-CM | POA: Diagnosis not present

## 2020-09-04 DIAGNOSIS — M545 Low back pain, unspecified: Secondary | ICD-10-CM | POA: Diagnosis not present

## 2020-09-04 DIAGNOSIS — F419 Anxiety disorder, unspecified: Secondary | ICD-10-CM | POA: Diagnosis not present

## 2020-09-04 DIAGNOSIS — R000 Tachycardia, unspecified: Secondary | ICD-10-CM | POA: Diagnosis not present

## 2020-09-04 IMAGING — CR DG CHEST 2V
2 series · 2 of 2 positions shown · non-contrast
Comparison: [DATE]

CLINICAL DATA: Cough and shortness of breath

EXAM:
CHEST - 2 VIEW

[w chest pa]
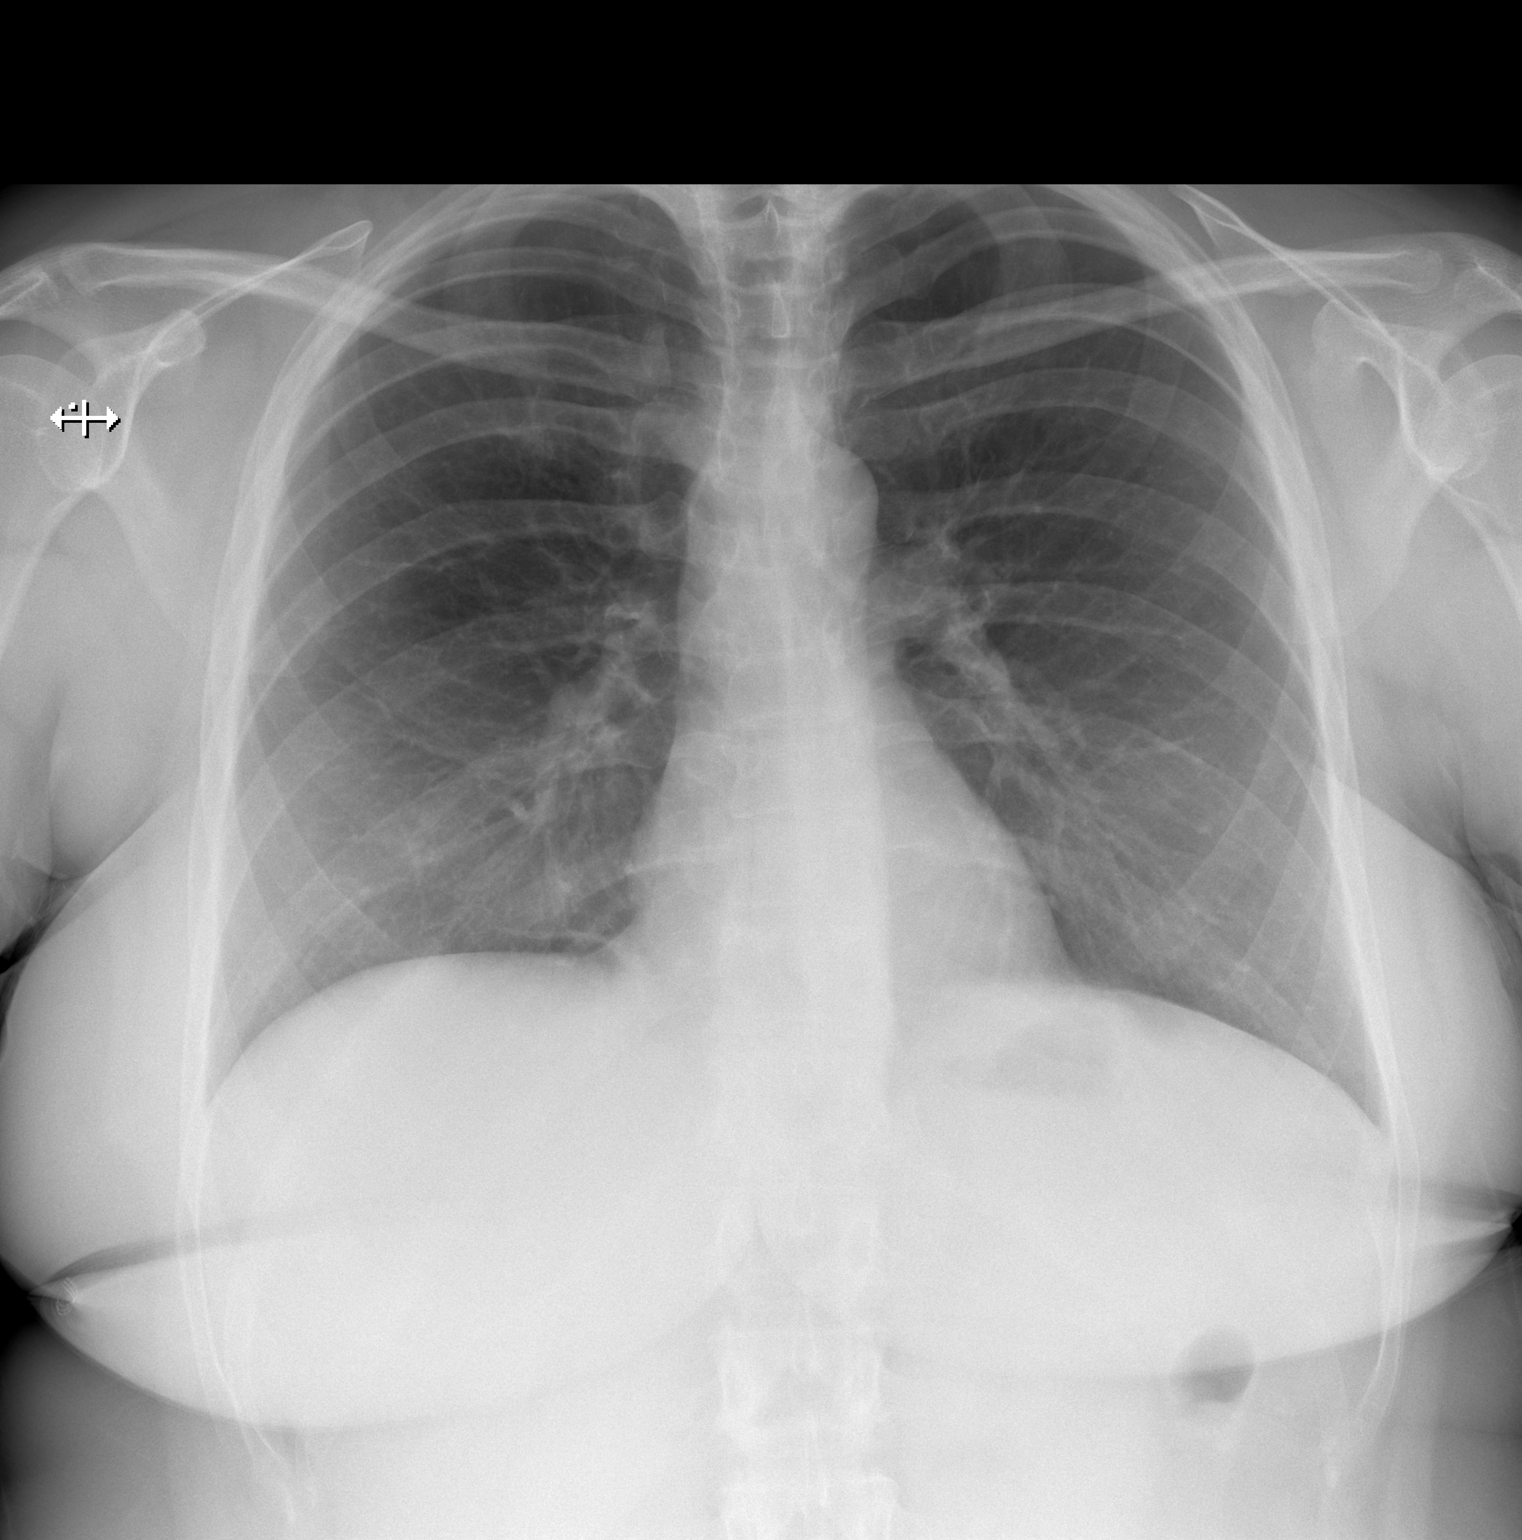

[w chest lat]
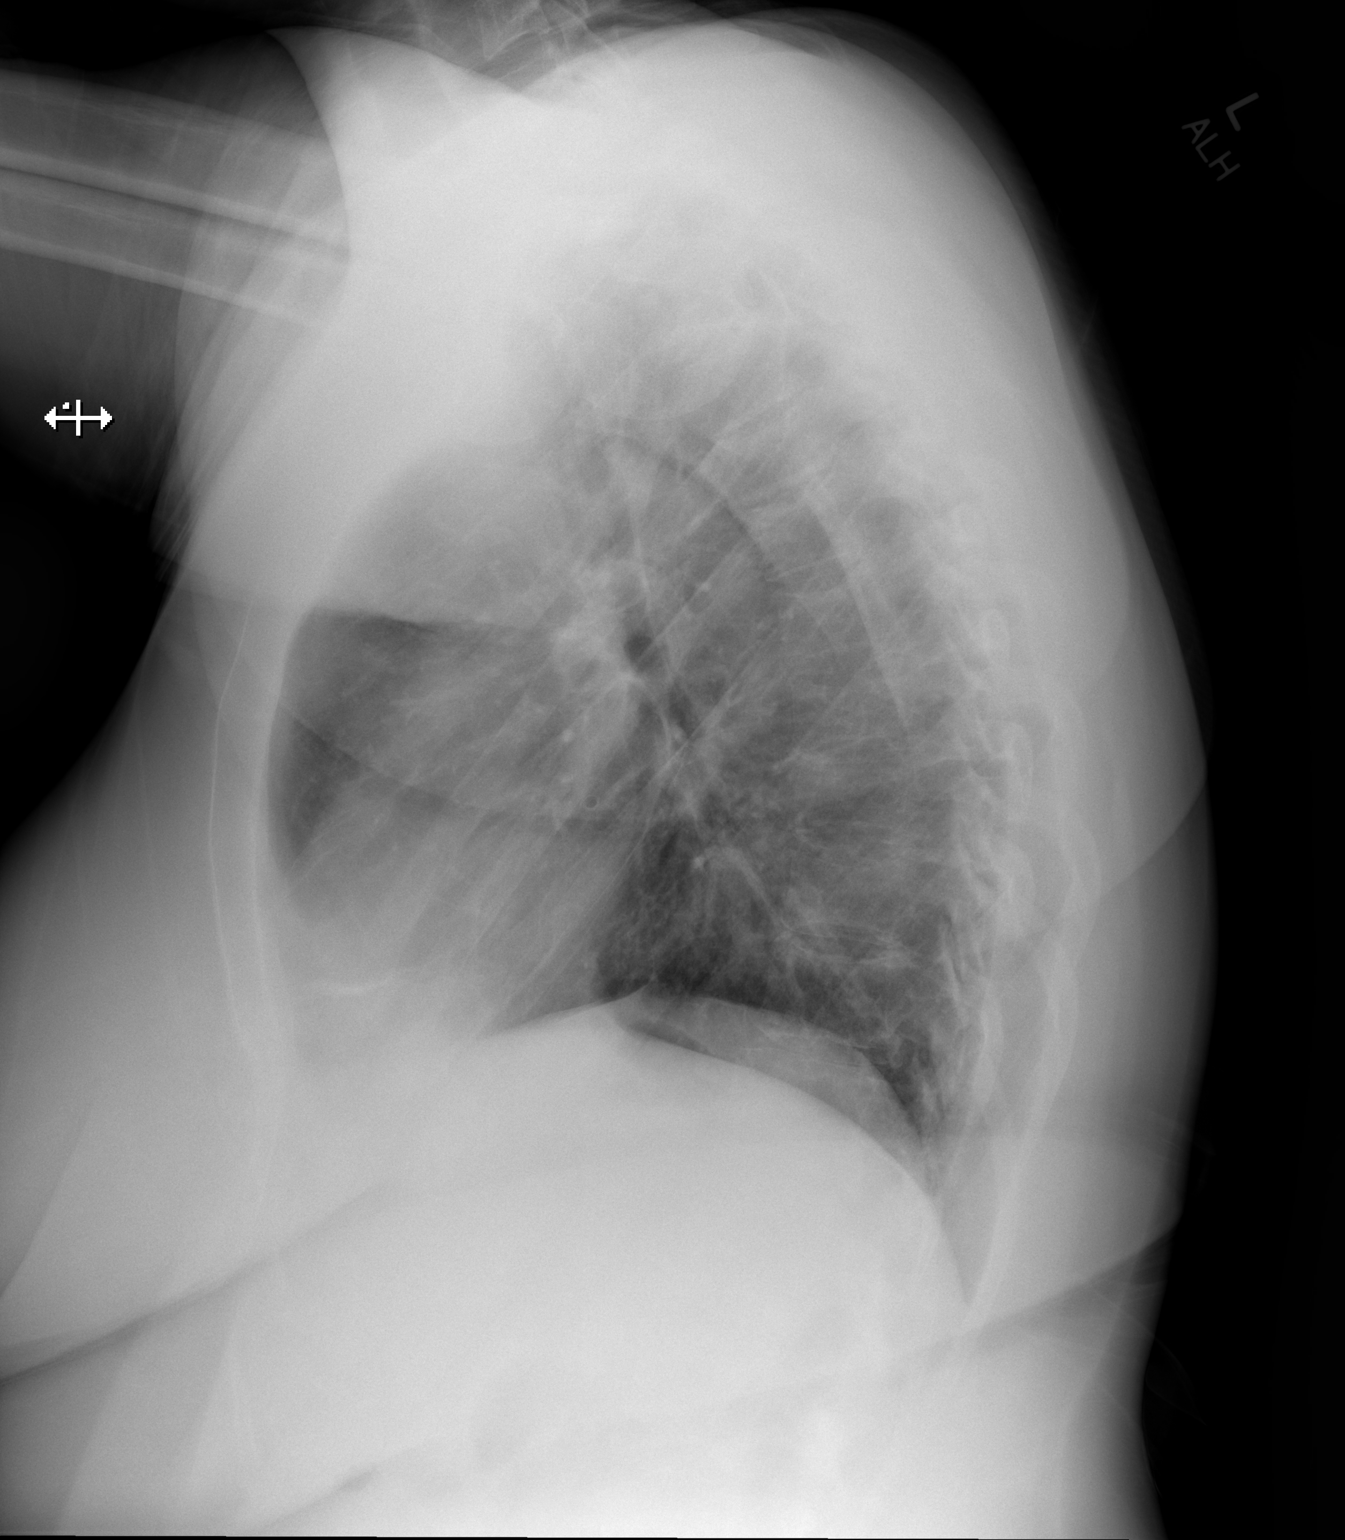

[2 of 2 positions shown; findings below may reference images not displayed]

FINDINGS: The heart size and mediastinal contours are within normal limits.
Both lungs are clear. The visualized skeletal structures are
unremarkable.
IMPRESSION: No active cardiopulmonary disease.

## 2020-09-04 MED ORDER — LORAZEPAM 0.5 MG PO TABS
ORAL_TABLET | ORAL | 0 refills | Status: DC
Start: 2020-09-04 — End: 2020-09-05
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
## 2020-09-04 NOTE — Telephone Encounter (Signed)
[name] [name] called and said you sent in a prescription for her Lorazepam.  But it needed to be sent to the [facility] on [address].  Not to [facility].  Please resend the prescription to the [facility].

## 2020-09-05 ENCOUNTER — Telehealth: Payer: Self-pay | Admitting: Adult Health

## 2020-09-05 DIAGNOSIS — F411 Generalized anxiety disorder: Secondary | ICD-10-CM

## 2020-09-05 MED ORDER — LORAZEPAM 0.5 MG PO TABS: ORAL_TABLET | ORAL | 0 refills | Status: DC

## 2020-09-05 NOTE — Telephone Encounter (Signed)
Script sent  

## 2020-09-05 NOTE — Telephone Encounter (Signed)
Pt called and would like to have her ativan sent to the [facility] on [address] and cancel the one at [facility]. In fact [facility] to use [facility] of [facility]

## 2020-09-07 LAB — NOVEL CORONAVIRUS, NAA: SARS-CoV-2, NAA: NOT DETECTED

## 2020-09-10 ENCOUNTER — Ambulatory Visit: Payer: BC Managed Care – PPO | Admitting: Nurse Practitioner

## 2020-09-11 ENCOUNTER — Telehealth: Payer: Self-pay | Admitting: Adult Health

## 2020-09-11 DIAGNOSIS — F411 Generalized anxiety disorder: Secondary | ICD-10-CM

## 2020-09-11 MED ORDER — LORAZEPAM 0.5 MG PO TABS: ORAL_TABLET | ORAL | 0 refills | Status: DC

## 2020-09-11 NOTE — Telephone Encounter (Signed)
Pt called requesting Ativan Rx for 8 days until next apt 09/20/20 [facility] [address] on file

## 2020-09-11 NOTE — Telephone Encounter (Signed)
Script sent  

## 2020-09-12 ENCOUNTER — Other Ambulatory Visit: Payer: Self-pay | Admitting: Adult Health

## 2020-09-13 ENCOUNTER — Other Ambulatory Visit: Payer: Self-pay

## 2020-09-13 ENCOUNTER — Ambulatory Visit (INDEPENDENT_AMBULATORY_CARE_PROVIDER_SITE_OTHER): Payer: BC Managed Care – PPO | Admitting: Nurse Practitioner

## 2020-09-13 ENCOUNTER — Encounter: Payer: Self-pay | Admitting: Nurse Practitioner

## 2020-09-13 VITALS — BP 110/80 | HR 117 | Temp 98.1°F | Ht 65.6 in | Wt 209.8 lb

## 2020-09-13 DIAGNOSIS — R000 Tachycardia, unspecified: Secondary | ICD-10-CM

## 2020-09-13 DIAGNOSIS — R0981 Nasal congestion: Secondary | ICD-10-CM | POA: Diagnosis not present

## 2020-09-13 DIAGNOSIS — Z1152 Encounter for screening for COVID-19: Secondary | ICD-10-CM | POA: Diagnosis not present

## 2020-09-13 DIAGNOSIS — Z139 Encounter for screening, unspecified: Secondary | ICD-10-CM | POA: Diagnosis not present

## 2020-09-13 NOTE — Progress Notes (Signed)
I,Yamilka Roman [employer] as a Neurosurgeon for [employer], FNP.,have documented all relevant documentation on the behalf of Arnette Felts, FNP,as directed by  Arnette Felts, FNP while in the presence of Arnette Felts, FNP. This visit occurred during the SARS-CoV-2 public health emergency.  Safety protocols were in place, including screening questions prior to the visit, additional usage of staff PPE, and extensive cleaning of exam room while observing appropriate contact time as indicated for disinfecting solutions.  Subjective:     Patient ID: [name] [name] , female    DOB: [birth year] , 29 y.o.   MRN: [id number]   Chief Complaint  Patient presents with  . Palpitations    HPI  She is here for recheck EKG. Her mother has left and the stress level is better.  She was given Ativan for the nausea and vomiting as well. She is trying to push fluids and eat small meals.   Her mood seems to be better today. She was treated with amoxicillin for an infected tooth, has been trying to take 3 times a day.    Wt Readings from Last 3 Encounters: 09/13/20 : 209 lb 12.8 oz (95.2 kg) 09/04/20 : 216 lb 12.8 oz (98.3 kg) 08/20/20 : 200 lb (90.7 kg)     Past Medical History:  Diagnosis Date  . ADHD (attention deficit hyperactivity disorder)   . Anxiety   . Panic attacks   . Serotonin syndrome   . Vaginal Pap smear, abnormal      Family History  Problem Relation Age of Onset  . Hypertension Mother   . Heart disease Father   . Cancer Father        testicular  . Hypertension Father   . Hyperlipidemia Father   . Heart disease Paternal Grandfather      Current Outpatient Medications:  .  albuterol (VENTOLIN HFA) 108 (90 Base) MCG/ACT inhaler, Inhale 1-2 puffs into the lungs every 6 (six) hours as needed for wheezing or shortness of breath., Disp: 18 g, Rfl: 3 .  fluticasone (FLONASE) 50 MCG/ACT nasal spray, USE 1 SPRAY IN EACH NOSTRIL TWICE A DAY., Disp: 16 g, Rfl: 2 .  LORazepam (ATIVAN) 0.5 MG  tablet, Take one tablet three times daily as needed for anxiety., Disp: 45 tablet, Rfl: 1 .  QUEtiapine (SEROQUEL) 25 MG tablet, Take 1 tablet (25 mg total) by mouth 2 (two) times daily., Disp: 60 tablet, Rfl: 2   Allergies  Allergen Reactions  . Ceclor [Cefaclor] Hives    Age 36 mild rash Tolerates ceftriaxone  . Latex Other (See Comments)    Irritation on skin  . Zofran [Ondansetron]     Per mother. Patient has serotonin syndrome and can not have Zofran.      Review of Systems  Constitutional: Negative.   HENT: Negative.   Eyes: Negative.   Respiratory: Negative.   Cardiovascular: Positive for palpitations.  Gastrointestinal: Negative.   Endocrine: Negative.   Genitourinary: Negative.   Musculoskeletal: Negative.   Skin: Negative.   Neurological: Negative.   Hematological: Negative.   Psychiatric/Behavioral: Negative.      Today's Vitals   09/13/20 0843  BP: 110/80  Pulse: (!) 117  Temp: 98.1 F (36.7 C)  TempSrc: Oral  Weight: 209 lb 12.8 oz (95.2 kg)  Height: 5' 5.6" (1.666 m)  PainSc: 0-No pain   Body mass index is 34.28 kg/m.   Objective:  Physical Exam Vitals reviewed.  Constitutional:      General: She is not in  acute distress.    Appearance: Normal appearance.  Cardiovascular:     Rate and Rhythm: Normal rate and regular rhythm.     Pulses: Normal pulses.     Heart sounds: No murmur heard.   Neurological:     General: No focal deficit present.     Mental Status: She is alert and oriented to person, place, and time.     Cranial Nerves: No cranial nerve deficit.  Psychiatric:        Mood and Affect: Mood normal.        Behavior: Behavior normal.        Thought Content: Thought content normal.        Judgment: Judgment normal.         Assessment And Plan:     1. Tachycardia  This is improved since her last visit, EKG is better with HR 114   I do feel I want to refer to cardiology for further evaluation this may be related to her  serotonin syndrome - EKG 12-Lead  2. Encounter for screening - Novel Coronavirus, NAA ([facility])  3. Nasal congestion  She has had new nasal congestion since her last visit and would like to be checked for covid again.  - Novel Coronavirus, NAA ([facility])     Patient was given opportunity to ask questions. Patient verbalized understanding of the plan and was able to repeat key elements of the plan. All questions were answered to their satisfaction.  Arnette Felts, FNP   I, [facility] [place], [place], have reviewed all documentation for this visit. The documentation on 09/13/20 for the exam, diagnosis, procedures, and orders are all accurate and complete.   THE PATIENT IS ENCOURAGED TO PRACTICE SOCIAL DISTANCING DUE TO THE COVID-19 PANDEMIC.

## 2020-09-15 LAB — NOVEL CORONAVIRUS, NAA: SARS-CoV-2, NAA: NOT DETECTED

## 2020-09-15 LAB — SARS-COV-2, NAA 2 DAY TAT

## 2020-09-17 DIAGNOSIS — Z23 Encounter for immunization: Secondary | ICD-10-CM | POA: Diagnosis not present

## 2020-09-20 ENCOUNTER — Encounter: Payer: Self-pay | Admitting: Adult Health

## 2020-09-20 ENCOUNTER — Telehealth (INDEPENDENT_AMBULATORY_CARE_PROVIDER_SITE_OTHER): Payer: BC Managed Care – PPO | Admitting: Adult Health

## 2020-09-20 DIAGNOSIS — F428 Other obsessive-compulsive disorder: Secondary | ICD-10-CM | POA: Diagnosis not present

## 2020-09-20 DIAGNOSIS — F411 Generalized anxiety disorder: Secondary | ICD-10-CM | POA: Diagnosis not present

## 2020-09-20 DIAGNOSIS — F331 Major depressive disorder, recurrent, moderate: Secondary | ICD-10-CM | POA: Diagnosis not present

## 2020-09-20 DIAGNOSIS — F909 Attention-deficit hyperactivity disorder, unspecified type: Secondary | ICD-10-CM

## 2020-09-20 MED ORDER — LORAZEPAM 0.5 MG PO TABS: ORAL_TABLET | ORAL | 1 refills | Status: DC

## 2020-09-20 MED ORDER — QUETIAPINE FUMARATE 25 MG PO TABS: 25.0000 mg | ORAL_TABLET | Freq: Two times a day (BID) | ORAL | 2 refills | Status: DC

## 2020-09-20 NOTE — Progress Notes (Signed)
[name] [name] [id number] 1990/10/16 30 y.o.  Virtual Visit via Video Note  I connected with pt @ on 09/20/20 at 10:40 AM EST by a video enabled telemedicine application and verified that I am speaking with the correct person using two identifiers.   I discussed the limitations of evaluation and management by telemedicine and the availability of in person appointments. The patient expressed understanding and agreed to proceed.  I discussed the assessment and treatment plan with the patient. The patient was provided an opportunity to ask questions and all were answered. The patient agreed with the plan and demonstrated an understanding of the instructions.   The patient was advised to call back or seek an in-person evaluation if the symptoms worsen or if the condition fails to improve as anticipated.  I provided 30 minutes of non-face-to-face time during this encounter.  The patient was located at home.  The provider was located at [place].   Aloha Gell, NP   Subjective:   Patient ID:  [name] [name] is a 30 y.o. (DOB August 31, 1991) female.  Chief Complaint: No chief complaint on file.   HPI   Recent hospitalizations - collateral reviewed.  [facility] presents for follow-up of MDD, GAD, Obsessional thoughts, and ADHD.  Describes mood today as "ok". Pleasant. Decreased tearfulness. Mood symptoms - reports decreased depression, anxiety, and irritability. Reports panic attacks - using Lorazepam as needed.Stating "I'm feeling better mentally every day". Able to "handle" children better. Stating [place] Mother has been staying with her to help out while she got back [employer] her feet. She has recently stopped spending the night. Feeling nauseas most of the time. Waking up every morning and throwing up. Stating "I feel better after I do". Has acid reflux and was previously taking Prilosec. Plans to restart to see if it will  help. Also plans to discuss with PCP. Has started seeing Lanetta Inch - therapist. Stating "I wish I could see him multiple times a week". Initialy doing better after visit but then "it leaves me". Stressed with increased Covid cases. Recent root canal. Improved interest and motivation. Taking Ativan three times daily and 25mg  at bedtim. Has left off morning dose of Seroquel because of sedation. Plans to move to bedtime. Energy levels better in the mornings. Active, does not have a regular exercise routine. Enjoys some usual interests and activities. Married, but separated. Lives with 2 daughters - 4 and 6. Family local and supportive.  Appetite adequate. Weight stable - 200 pounds.  Sleeps well most nights. Averges 6-8 or 10-11 hours. Taking Seroquel 25mg  at bedtime. Up and down during the night checking on children. Focus and concentration difficulties.Not taking Adderall regularly. Completing tasks. Managing some aspects of household. Not working currently - looking for a job. College graduate - [employer]. Denies SI or HI.  Denies AH or VH.  Previous medication trials: Adderall, Clonazepam, Lorazepam, Trazadone, Lexapro, Paxil, Zoloft.  Review of Systems:  Review of Systems  Musculoskeletal: Negative for gait problem.  Neurological: Negative for tremors.  Psychiatric/Behavioral:       Please refer to HPI    Medications: I have reviewed the patient's current medications.  Current Outpatient Medications  Medication Sig Dispense Refill  . albuterol (VENTOLIN HFA) 108 (90 Base) MCG/ACT inhaler Inhale 1-2 puffs into the lungs every 6 (six) hours as needed for wheezing or shortness of breath. 18 g 3  . fluticasone (FLONASE)  50 MCG/ACT nasal spray USE 1 SPRAY IN EACH NOSTRIL TWICE A DAY. 16 g 2  . LORazepam (ATIVAN) 0.5 MG tablet Take one tablet three times daily as needed for anxiety. 45 tablet 1  . QUEtiapine (SEROQUEL) 25 MG tablet Take 1 tablet (25 mg total) by mouth 2  (two) times daily. 60 tablet 2   No current facility-administered medications for this visit.    Medication Side Effects: None  Allergies:  Allergies  Allergen Reactions  . Ceclor [Cefaclor] Hives    Age 60 mild rash Tolerates ceftriaxone  . Latex Other (See Comments)    Irritation on skin  . Zofran [Ondansetron]     Per mother. Patient has serotonin syndrome and can not have Zofran.     Past Medical History:  Diagnosis Date  . ADHD (attention deficit hyperactivity disorder)   . Anxiety   . Panic attacks   . Serotonin syndrome   . Vaginal Pap smear, abnormal     Family History  Problem Relation Age of Onset  . Hypertension Mother   . Heart disease Father   . Cancer Father        testicular  . Hypertension Father   . Hyperlipidemia Father   . Heart disease Paternal Grandfather     Social History   Socioeconomic History  . Marital status: Married    Spouse name: Not on file  . Number of children: Not on file  . Years of education: Not on file  . Highest education level: Not on file  Occupational History  . Not on file  Tobacco Use  . Smoking status: Former Smoker    Types: Cigarettes    Quit date: 08/15/2013    Years since quitting: 7.1  . Smokeless tobacco: Never Used  Substance and Sexual Activity  . Alcohol use: No    Alcohol/week: 2.0 standard drinks    Types: 2 Standard drinks or equivalent per week    Comment: socially, not with preg  . Drug use: No  . Sexual activity: Not Currently    Birth control/protection: I.U.D.  Other Topics Concern  . Not on file  Social History Narrative  . Not on file   Social Determinants of Health   Financial Resource Strain: Not on file  Food Insecurity: Not on file  Transportation Needs: Not on file  Physical Activity: Not on file  Stress: Not on file  Social Connections: Not on file  Intimate Partner Violence: Not on file    Past Medical History, Surgical history, Social history, and Family history were  reviewed and updated as appropriate.   Please see review of systems for further details on the patient's review from today.   Objective:   Physical Exam:  There were no vitals taken for this visit.  Physical Exam Constitutional:      General: She is not in acute distress. Musculoskeletal:        General: No deformity.  Neurological:     Mental Status: She is alert and oriented to person, place, and time.     Coordination: Coordination normal.  Psychiatric:        Attention and Perception: Attention and perception normal. She does not perceive auditory or visual hallucinations.        Mood and Affect: Mood normal. Mood is not anxious or depressed. Affect is not labile, blunt, angry or inappropriate.        Speech: Speech normal.        Behavior: Behavior normal.  Thought Content: Thought content normal. Thought content is not paranoid or delusional. Thought content does not include homicidal or suicidal ideation. Thought content does not include homicidal or suicidal plan.        Cognition and Memory: Cognition and memory normal.        Judgment: Judgment normal.     Comments: Insight intact     Lab Review:     Component Value Date/Time   NA 139 08/30/2020 1050   NA 141 06/29/2017 1224   K 3.6 08/30/2020 1050   CL 103 08/30/2020 1050   CO2 22 08/30/2020 1050   GLUCOSE 140 (H) 08/30/2020 1050   BUN 8 08/30/2020 1050   BUN 8 06/29/2017 1224   CREATININE 1.01 (H) 08/30/2020 1050   CALCIUM 9.7 08/30/2020 1050   PROT 7.3 08/23/2020 0753   PROT 7.1 06/29/2017 1224   ALBUMIN 4.0 08/23/2020 0753   ALBUMIN 4.5 06/29/2017 1224   AST 77 (H) 08/23/2020 0753   ALT 48 (H) 08/23/2020 0753   ALKPHOS 96 08/23/2020 0753   BILITOT 1.0 08/23/2020 0753   BILITOT 0.8 06/29/2017 1224   GFRNONAA >60 08/30/2020 1050   GFRAA 133 06/29/2017 1224       Component Value Date/Time   WBC 11.7 (H) 08/30/2020 1050   RBC 4.90 08/30/2020 1050   HGB 14.5 08/30/2020 1050   HGB 13.7  06/29/2017 1224   HGB 12.7 06/06/2015 0000   HCT 43.9 08/30/2020 1050   HCT 41.0 06/29/2017 1224   HCT 37 06/06/2015 0000   PLT 480 (H) 08/30/2020 1050   PLT 312 06/29/2017 1224   PLT 300 06/06/2015 0000   MCV 89.6 08/30/2020 1050   MCV 92.5 02/16/2018 1224   MCV 93 06/29/2017 1224   MCH 29.6 08/30/2020 1050   MCHC 33.0 08/30/2020 1050   RDW 12.4 08/30/2020 1050   RDW 13.3 06/29/2017 1224   LYMPHSABS 1.9 08/30/2020 1050   LYMPHSABS 1.7 06/29/2017 1224   MONOABS 0.6 08/30/2020 1050   EOSABS 0.0 08/30/2020 1050   EOSABS 0.2 06/29/2017 1224   BASOSABS 0.1 08/30/2020 1050   BASOSABS 0.0 06/29/2017 1224    No results found for: POCLITH, LITHIUM   No results found for: PHENYTOIN, PHENOBARB, VALPROATE, CBMZ   .res Assessment: Plan:    Plan:  PDMP reviewed  Current medications:  1. Lorazepam 0.5mg  TID - will send in a 2 week supply with a refill. 2. Seroquel 25 mg - 2 at bedtime - taking one at bedtime.  Seeing therapist - Elio Forget.  Patient advised to contact office with any questions, adverse effects, or acute worsening in signs and symptoms.  Discussed potential benefits, risks, and side effects of stimulants with patient to include increased heart rate, palpitations, insomnia, increased anxiety, increased irritability, or decreased appetite.  Instructed patient to contact office if experiencing any significant tolerability issues.  Discussed potential metabolic side effects associated with atypical antipsychotics, as well as potential risk for movement side effects. Advised pt to contact office if movement side effects occur.   Discussed potential benefits, risk, and side effects of benzodiazepines to include potential risk of tolerance and dependence, as well as possible drowsiness.  Advised patient not to drive if experiencing drowsiness and to take lowest possible effective dose to minimize risk of dependence and tolerance.   Diagnoses and all orders for this  visit:  Generalized anxiety disorder -     LORazepam (ATIVAN) 0.5 MG tablet; Take one tablet three times daily as needed for  anxiety.  Major depressive disorder, recurrent episode, moderate (HCC) -     Discontinue: QUEtiapine (SEROQUEL) 25 MG tablet; Take 1 tablet (25 mg total) by mouth 2 (two) times daily. -     QUEtiapine (SEROQUEL) 25 MG tablet; Take 1 tablet (25 mg total) by mouth 2 (two) times daily.  Obsessional thoughts  Attention deficit hyperactivity disorder (ADHD), unspecified ADHD type     Please see After Visit Summary for patient specific instructions.  Future Appointments  Date Time Provider Department Center  10/02/2020 11:00 AM Waldron Session, [facility] None  10/05/2020 10:40 AM Capricia Serda, Thereasa Solo, NP [facility] None  10/15/2020  8:40 AM Idalie Canto, Thereasa Solo, NP [facility] None  10/16/2020 10:00 AM Waldron Session, [facility] None  10/30/2020 10:00 AM Waldron Session, [facility] None  10/31/2020  8:40 AM Esmeralda Blanford, Thereasa Solo, NP [facility] None  11/07/2020 10:00 AM Waldron Session, [facility] None  11/14/2020  8:30 AM Charlesetta Ivory, NP [facility] None    No orders of the defined types were placed in this encounter.     -------------------------------

## 2020-09-27 DIAGNOSIS — Z1152 Encounter for screening for COVID-19: Secondary | ICD-10-CM | POA: Diagnosis not present

## 2020-10-02 ENCOUNTER — Ambulatory Visit: Payer: BC Managed Care – PPO | Admitting: Mental Health

## 2020-10-02 DIAGNOSIS — Z1152 Encounter for screening for COVID-19: Secondary | ICD-10-CM | POA: Diagnosis not present

## 2020-10-05 ENCOUNTER — Telehealth: Payer: BC Managed Care – PPO | Admitting: Adult Health

## 2020-10-05 ENCOUNTER — Other Ambulatory Visit: Payer: Self-pay | Admitting: Adult Health

## 2020-10-05 DIAGNOSIS — Z1152 Encounter for screening for COVID-19: Secondary | ICD-10-CM | POA: Diagnosis not present

## 2020-10-05 DIAGNOSIS — J9801 Acute bronchospasm: Secondary | ICD-10-CM

## 2020-10-05 DIAGNOSIS — F411 Generalized anxiety disorder: Secondary | ICD-10-CM

## 2020-10-05 MED ORDER — LORAZEPAM 0.5 MG PO TABS: ORAL_TABLET | ORAL | 1 refills | Status: DC

## 2020-10-15 ENCOUNTER — Telehealth (INDEPENDENT_AMBULATORY_CARE_PROVIDER_SITE_OTHER): Payer: BC Managed Care – PPO | Admitting: Adult Health

## 2020-10-15 ENCOUNTER — Encounter: Payer: Self-pay | Admitting: Adult Health

## 2020-10-15 DIAGNOSIS — F428 Other obsessive-compulsive disorder: Secondary | ICD-10-CM

## 2020-10-15 DIAGNOSIS — F331 Major depressive disorder, recurrent, moderate: Secondary | ICD-10-CM | POA: Diagnosis not present

## 2020-10-15 DIAGNOSIS — F909 Attention-deficit hyperactivity disorder, unspecified type: Secondary | ICD-10-CM | POA: Diagnosis not present

## 2020-10-15 DIAGNOSIS — F411 Generalized anxiety disorder: Secondary | ICD-10-CM | POA: Diagnosis not present

## 2020-10-15 MED ORDER — LORAZEPAM 0.5 MG PO TABS: ORAL_TABLET | ORAL | 1 refills | Status: DC

## 2020-10-15 NOTE — Progress Notes (Signed)
[name] [name] [id number] 08-21-1991 30 y.o.  Virtual Visit via Telephone Note  I connected with pt on 10/15/20 at  8:40 AM EST by telephone and verified that I am speaking with the correct person using two identifiers.   I discussed the limitations, risks, security and privacy concerns of performing an evaluation and management service by telephone and the availability of in person appointments. I also discussed with the patient that there may be a patient responsible charge related to this service. The patient expressed understanding and agreed to proceed.   I discussed the assessment and treatment plan with the patient. The patient was provided an opportunity to ask questions and all were answered. The patient agreed with the plan and demonstrated an understanding of the instructions.   The patient was advised to call back or seek an in-person evaluation if the symptoms worsen or if the condition fails to improve as anticipated.  I provided 30 minutes of non-face-to-face time during this encounter.  The patient was located at home.  The provider was located at [facility].   [name] Gibbs, NP   Subjective:   Patient ID:  [name] [name] is a 30 y.o. (DOB 08/07/1991) female.  Chief Complaint: No chief complaint on file.   HPI [name] [name] presents for follow-up of MDD, GAD, Obsessional thoughts, and ADHD.  Describes mood today as "ok". Pleasant. Decreased tearfulness. Mood symptoms - reports decreased depression and irritability. Feels anxious at times. Denies recent panic attacks - using Lorazepam as needed. Mood is "stable". Taking Seroquel at night. Feels "appropriate" in situations. Feeling better "mentally". Mother supportive. Going on more walks. Plans to see a nutritionist. Positive for Covid. Seeing PCP this week for acid reflux. Seeing Elio Forget - therapist. Improved interest and motivation.  Energy levels improved. Active, does not have a regular exercise  routine. Enjoys some usual interests and activities. Married, but separated. Lives with 2 daughters - 4 and 6. Family local and supportive.  Appetite adequate. Weight stable - 200 pounds.  Sleeps well most nights. Averges 8 hours.  Focus and concentration difficulties. Has not restarted Adderall and does not want to at this time. Completing tasks. Managing some aspects of household. Not working currently - looking for a job. 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 Denies SI or HI.  Denies AH or VH.  Previous medication trials: Adderall, Clonazepam, Lorazepam, Trazadone, Lexapro, Paxil, Zoloft.  Review of Systems:  Review of Systems  Musculoskeletal: Negative for gait problem.  Neurological: Negative for tremors.  Psychiatric/Behavioral:       Please refer to HPI    Medications: I have reviewed the patient's current medications.  Current Outpatient Medications  Medication Sig Dispense Refill  . albuterol (VENTOLIN HFA) 108 (90 Base) MCG/ACT inhaler Inhale 1-2 puffs into the lungs every 6 (six) hours as needed for wheezing or shortness of breath. 18 g 3  . fluticasone (FLONASE) 50 MCG/ACT nasal spray USE 1 SPRAY IN EACH NOSTRIL TWICE A DAY. 16 g 2  . LORazepam (ATIVAN) 0.5 MG tablet Take one tablet three times daily as needed for anxiety. 45 tablet 1  . QUEtiapine (SEROQUEL) 25 MG tablet Take 1 tablet (25 mg total) by mouth 2 (two) times daily. 60 tablet 2   No current facility-administered medications for this visit.    Medication Side Effects: None  Allergies:  Allergies  Allergen Reactions  . Ceclor [Cefaclor] Hives    Age 39 mild rash Tolerates ceftriaxone  . Latex Other (  See Comments)    Irritation on skin  . Zofran [Ondansetron]     Per mother. Patient has serotonin syndrome and can not have Zofran.     Past Medical History:  Diagnosis Date  . ADHD (attention deficit hyperactivity disorder)   . Anxiety   . Panic attacks   . Serotonin syndrome   . Vaginal  Pap smear, abnormal     Family History  Problem Relation Age of Onset  . Hypertension Mother   . Heart disease Father   . Cancer Father        testicular  . Hypertension Father   . Hyperlipidemia Father   . Heart disease Paternal Grandfather     Social History   Socioeconomic History  . Marital status: Married    Spouse name: Not on file  . Number of children: Not on file  . Years of education: Not on file  . Highest education level: Not on file  Occupational History  . Not on file  Tobacco Use  . Smoking status: Former Smoker    Types: Cigarettes    Quit date: 08/15/2013    Years since quitting: 7.1  . Smokeless tobacco: Never Used  Substance and Sexual Activity  . Alcohol use: No    Alcohol/week: 2.0 standard drinks    Types: 2 Standard drinks or equivalent per week    Comment: socially, not with preg  . Drug use: No  . Sexual activity: Not Currently    Birth control/protection: I.U.D.  Other Topics Concern  . Not on file  Social History Narrative  . Not on file   Social Determinants of Health   Financial Resource Strain: Not on file  Food Insecurity: Not on file  Transportation Needs: Not on file  Physical Activity: Not on file  Stress: Not on file  Social Connections: Not on file  Intimate Partner Violence: Not on file    Past Medical History, Surgical history, Social history, and Family history were reviewed and updated as appropriate.   Please see review of systems for further details on the patient's review from today.   Objective:   Physical Exam:  There were no vitals taken for this visit.  Physical Exam Neurological:     Mental Status: She is alert and oriented to person, place, and time.     Cranial Nerves: No dysarthria.  Psychiatric:        Attention and Perception: Attention and perception normal.        Mood and Affect: Mood normal.        Speech: Speech normal.        Behavior: Behavior is cooperative.        Thought Content:  Thought content normal. Thought content is not paranoid or delusional. Thought content does not include homicidal or suicidal ideation. Thought content does not include homicidal or suicidal plan.        Cognition and Memory: Cognition and memory normal.        Judgment: Judgment normal.     Comments: Insight intact     Lab Review:     Component Value Date/Time   NA 139 08/30/2020 1050   NA 141 06/29/2017 1224   K 3.6 08/30/2020 1050   CL 103 08/30/2020 1050   CO2 22 08/30/2020 1050   GLUCOSE 140 (H) 08/30/2020 1050   BUN 8 08/30/2020 1050   BUN 8 06/29/2017 1224   CREATININE 1.01 (H) 08/30/2020 1050   CALCIUM 9.7 08/30/2020 1050  PROT 7.3 08/23/2020 0753   PROT 7.1 06/29/2017 1224   ALBUMIN 4.0 08/23/2020 0753   ALBUMIN 4.5 06/29/2017 1224   AST 77 (H) 08/23/2020 0753   ALT 48 (H) 08/23/2020 0753   ALKPHOS 96 08/23/2020 0753   BILITOT 1.0 08/23/2020 0753   BILITOT 0.8 06/29/2017 1224   GFRNONAA >60 08/30/2020 1050   GFRAA 133 06/29/2017 1224       Component Value Date/Time   WBC 11.7 (H) 08/30/2020 1050   RBC 4.90 08/30/2020 1050   HGB 14.5 08/30/2020 1050   HGB 13.7 06/29/2017 1224   HGB 12.7 06/06/2015 0000   HCT 43.9 08/30/2020 1050   HCT 41.0 06/29/2017 1224   HCT 37 06/06/2015 0000   PLT 480 (H) 08/30/2020 1050   PLT 312 06/29/2017 1224   PLT 300 06/06/2015 0000   MCV 89.6 08/30/2020 1050   MCV 92.5 02/16/2018 1224   MCV 93 06/29/2017 1224   MCH 29.6 08/30/2020 1050   MCHC 33.0 08/30/2020 1050   RDW 12.4 08/30/2020 1050   RDW 13.3 06/29/2017 1224   LYMPHSABS 1.9 08/30/2020 1050   LYMPHSABS 1.7 06/29/2017 1224   MONOABS 0.6 08/30/2020 1050   EOSABS 0.0 08/30/2020 1050   EOSABS 0.2 06/29/2017 1224   BASOSABS 0.1 08/30/2020 1050   BASOSABS 0.0 06/29/2017 1224    No results found for: POCLITH, LITHIUM   No results found for: PHENYTOIN, PHENOBARB, VALPROATE, CBMZ   .res Assessment: Plan:    Plan:  PDMP reviewed  Current medications:  1.  Lorazepam 0.5mg  TID - will send in a 2 week supply with a refill. 2. Seroquel 25 mg - 2 at bedtime - taking one at bedtime.  Seeing therapist - Elio Forget.  Patient advised to contact office with any questions, adverse effects, or acute worsening in signs and symptoms.  Discussed potential benefits, risks, and side effects of stimulants with patient to include increased heart rate, palpitations, insomnia, increased anxiety, increased irritability, or decreased appetite.  Instructed patient to contact office if experiencing any significant tolerability issues.  Discussed potential metabolic side effects associated with atypical antipsychotics, as well as potential risk for movement side effects. Advised pt to contact office if movement side effects occur.   Discussed potential benefits, risk, and side effects of benzodiazepines to include potential risk of tolerance and dependence, as well as possible drowsiness.  Advised patient not to drive if experiencing drowsiness and to take lowest possible effective dose to minimize risk of dependence and tolerance.    Diagnoses and all orders for this visit:  Generalized anxiety disorder -     LORazepam (ATIVAN) 0.5 MG tablet; Take one tablet three times daily as needed for anxiety.  Major depressive disorder, recurrent episode, moderate (HCC)  Obsessional thoughts  Attention deficit hyperactivity disorder (ADHD), unspecified ADHD type    Please see After Visit Summary for patient specific instructions.  Future Appointments  Date Time Provider Department Center  10/16/2020 10:00 AM Waldron Session, [facility] None  10/30/2020 10:00 AM Waldron Session, [facility] None  10/31/2020  8:40 AM Madoc Holquin, Thereasa Solo, NP CP[facility] None  11/07/2020 10:00 AM Waldron Session, [facility] CP[facility] None  11/14/2020  8:30 AM Charlesetta Ivory, NP [facility] None    No orders of the defined types were placed in this encounter.      -------------------------------

## 2020-10-16 ENCOUNTER — Ambulatory Visit: Payer: BC Managed Care – PPO | Admitting: Mental Health

## 2020-10-30 ENCOUNTER — Ambulatory Visit (INDEPENDENT_AMBULATORY_CARE_PROVIDER_SITE_OTHER): Payer: BC Managed Care – PPO | Admitting: Mental Health

## 2020-10-30 ENCOUNTER — Other Ambulatory Visit: Payer: Self-pay

## 2020-10-30 DIAGNOSIS — F411 Generalized anxiety disorder: Secondary | ICD-10-CM

## 2020-10-30 NOTE — Progress Notes (Signed)
[facility] Note  Name: [name] [name] Date: 10/30/2020 MRN: [id number] DOB: 1990-11-21 PCP: Patient, No Pcp Per  Time spent: 53 minutes  Treatment:  Individual therapy  Mental Status Exam:    Appearance:    Casual     Behavior:   Appropriate  Motor:   WNL  Speech/Language:    Clear and Coherent  Affect:   constricted  Mood:   Anxious, depressed  Thought process:   normal  Thought content:     WNL  Sensory/Perceptual disturbances:     none  Orientation:   x4  Attention:   Good  Concentration:   Good  Memory:   Intact  Fund of knowledge:    Consistent with age and development  Insight:     developing  Judgment:    Good  Impulse Control:   Good    Reported Symptoms:  Appetite disturbance, sleep disturbance, anxiety (8/10), depression (8/10), low motivation, isolative,   Risk Assessment: Danger to Self:  No Self-injurious Behavior: No Danger to Others: No Duty to Warn:no Physical Aggression / Violence:No  Access to Firearms a concern: No  Gang Involvement:No  Patient / guardian was educated about steps to take if suicide or homicide risk level increases between visits:   While future psychiatric events cannot be accurately predicted, the patient does not currently require acute inpatient psychiatric care and does not currently meet [facility] involuntary commitment criteria.    Medical History/Surgical History:  Past Medical History:  Diagnosis Date  . ADHD (attention deficit hyperactivity disorder)   . Anxiety   . Panic attacks   . Serotonin syndrome   . Vaginal Pap smear, abnormal     Past Surgical History:  Procedure Laterality Date  . DILATION AND CURETTAGE OF UTERUS  01/04/16  . DILATION AND EVACUATION N/A 01/04/2016   Procedure: DILATATION AND EVACUATION;  Surgeon: Candice Camp, MD;  Location: [facility];  Service: Gynecology;  Laterality: N/A;  . GUM SURGERY  01/2017  . INDUCED ABORTION    . wisdom teeth removal Bilateral    2010     Medications: Current Outpatient Medications  Medication Sig Dispense Refill  . albuterol (VENTOLIN HFA) 108 (90 Base) MCG/ACT inhaler Inhale 1-2 puffs into the lungs every 6 (six) hours as needed for wheezing or shortness of breath. 18 g 3  . fluticasone (FLONASE) 50 MCG/ACT nasal spray USE 1 SPRAY IN EACH NOSTRIL TWICE A DAY. 16 g 2  . LORazepam (ATIVAN) 0.5 MG tablet Take one tablet three times daily as needed for anxiety. 45 tablet 1  . QUEtiapine (SEROQUEL) 25 MG tablet Take 1 tablet (25 mg total) by mouth 2 (two) times daily. 60 tablet 2   No current facility-administered medications for this visit.    Allergies  Allergen Reactions  . Ceclor [Cefaclor] Hives    Age 30 mild rash Tolerates ceftriaxone  . Latex Other (See Comments)    Irritation on skin  . Zofran [Ondansetron]     Per mother. Patient has serotonin syndrome and can not have Zofran.      Reason for Visit /Presenting Problem:  Patient shared house she and her family members are getting over having covid about 2 weeks ago. She said that she's made progress in her recovery, however continues to cope with fatigue and problems with focus and attention. She went on to share stressors, some marital where she or like her husband to be more helpful at home with the kids. Through guided discovery, she identified  how she needs to continue to step away from situations where she starts to feel upset in these situations to allow herself to get her mind off upsetting thoughts when she needs him to be more helpful. She shared or history related to diagnosis, coping with ADHD from childhood and concerns about her daughter who is 34 years old who may be struggling with the diagnosis as well. She plans to have her engage in some testing to clarify. Collaboratively, explored ways to de-stress and she stated she wants to get her home more organized in some areas and has struggled due to the ongoing life stressors over the past few months. She  plans to follow through on decluttering their children's playroom packing away some items.      Interventions: Further assessment, CBT, supportive therapy, solution focused  Diagnoses:    ICD-10-CM   1. Generalized anxiety disorder  F41.1     Plan: Patient is to use CBT, mindfulness and coping skills to help manage decrease symptoms associated with their diagnosis.    Patient to follow through by adhering to a consistent sleep schedule, getting to bed on time earlier in the evenings.  Patient to follow through with completing 3 specific tasks per day to slowly improve her functioning daily.   Long-term goal:   Reduce overall level, frequency, and intensity of the feelings of depression and anxiety 8-9/10 to a 0-2/10 in severity for at least 3 consecutive months per patient report   Short-term goal:  Increase ability to relax by watching movies, using blankets for comfort.  Increase functioning daily evidenced by such tasks as making lunch for her kids, cleaning when needed, achieving at least 3 tasks per/day Improve sleep by going to bed early, waking at night from about 1am-4am, then goes back to bed for a few hours.  Improve coping skills as identified in session to decrease panic attacks, depression symptoms.    Assessment of progress:  progressing   Waldron Session, [facility]

## 2020-10-31 ENCOUNTER — Telehealth (INDEPENDENT_AMBULATORY_CARE_PROVIDER_SITE_OTHER): Payer: BC Managed Care – PPO | Admitting: Adult Health

## 2020-10-31 ENCOUNTER — Encounter: Payer: Self-pay | Admitting: Adult Health

## 2020-10-31 DIAGNOSIS — F411 Generalized anxiety disorder: Secondary | ICD-10-CM | POA: Diagnosis not present

## 2020-10-31 DIAGNOSIS — F428 Other obsessive-compulsive disorder: Secondary | ICD-10-CM

## 2020-10-31 DIAGNOSIS — F909 Attention-deficit hyperactivity disorder, unspecified type: Secondary | ICD-10-CM | POA: Diagnosis not present

## 2020-10-31 DIAGNOSIS — F331 Major depressive disorder, recurrent, moderate: Secondary | ICD-10-CM

## 2020-10-31 NOTE — Progress Notes (Signed)
[name] [name] [id number] 05-10-1991 30 y.o.  Virtual Visit via Video Note  I connected with pt @ on 10/31/20 at  8:40 AM EST by a video enabled telemedicine application and verified that I am speaking with the correct person using two identifiers.   I discussed the limitations of evaluation and management by telemedicine and the availability of in person appointments. The patient expressed understanding and agreed to proceed.  I discussed the assessment and treatment plan with the patient. The patient was provided an opportunity to ask questions and all were answered. The patient agreed with the plan and demonstrated an understanding of the instructions.   The patient was advised to call back or seek an in-person evaluation if the symptoms worsen or if the condition fails to improve as anticipated.  I provided 30 minutes of non-face-to-face time during this encounter.  The patient was located at home.  The provider was located at [facility].   Dorothyann Gibbs, NP   Subjective:   Patient ID:  [name] [name] is a 30 y.o. (DOB 1991/08/24) female.  Chief Complaint: No chief complaint on file.   HPI [name] [name] presents for follow-up of MDD, GAD, Obsessional thoughts, and ADHD.  Describes mood today as "ok". Pleasant. Decreased tearfulness. Mood symptoms - reports decreased depression, anxiety and irritability. Denies recent panic attacks - "not like before". Has periods of "heightened" anxiety.  Feels like she is in a "good" place. Stating "I'm doing good". Recovering from Covid. Started Prilosec for acid reflux. Seeing Elio Forget - therapist. Improved interest and motivation.  Energy levels improved - "pushing through". Active, does not have a regular exercise routine. Enjoys some usual interests and activities. Married, but separated. Lives with 2 daughters - 4 and 6. Family local and supportive.  Appetite adequate. Weight stable - 200 pounds.  Sleeps well most  nights. Averges 8 hours - more for past few weeks with being sick.  Focus and concentration difficulties. Considering restarting a stimulant.  Completing tasks. Managing some aspects of household. 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 Denies SI or HI.  Denies AH or VH.  Previous medication trials: Adderall, Clonazepam, Lorazepam, Trazadone, Lexapro, Paxil, Zoloft.  Review of Systems:  Review of Systems  Musculoskeletal: Negative for gait problem.  Neurological: Negative for tremors.  Psychiatric/Behavioral:       Please refer to HPI    Medications: I have reviewed the patient's current medications.  Current Outpatient Medications  Medication Sig Dispense Refill  . albuterol (VENTOLIN HFA) 108 (90 Base) MCG/ACT inhaler Inhale 1-2 puffs into the lungs every 6 (six) hours as needed for wheezing or shortness of breath. 18 g 3  . fluticasone (FLONASE) 50 MCG/ACT nasal spray USE 1 SPRAY IN EACH NOSTRIL TWICE A DAY. 16 g 2  . LORazepam (ATIVAN) 0.5 MG tablet Take one tablet three times daily as needed for anxiety. 45 tablet 1  . QUEtiapine (SEROQUEL) 25 MG tablet Take 1 tablet (25 mg total) by mouth 2 (two) times daily. 60 tablet 2   No current facility-administered medications for this visit.    Medication Side Effects: None  Allergies:  Allergies  Allergen Reactions  . Ceclor [Cefaclor] Hives    Age 30 mild rash Tolerates ceftriaxone  . Latex Other (See Comments)    Irritation on skin  . Zofran [Ondansetron]     Per mother. Patient has serotonin syndrome and can not have Zofran.     Past Medical History:  Diagnosis Date  .  ADHD (attention deficit hyperactivity disorder)   . Anxiety   . Panic attacks   . Serotonin syndrome   . Vaginal Pap smear, abnormal     Family History  Problem Relation Age of Onset  . Hypertension Mother   . Heart disease Father   . Cancer Father        testicular  . Hypertension Father   . Hyperlipidemia Father   . Heart disease  Paternal Grandfather     Social History   Socioeconomic History  . Marital status: Married    Spouse name: Not on file  . Number of children: Not on file  . Years of education: Not on file  . Highest education level: Not on file  Occupational History  . Not on file  Tobacco Use  . Smoking status: Former Smoker    Types: Cigarettes    Quit date: 08/15/2013    Years since quitting: 7.2  . Smokeless tobacco: Never Used  Substance and Sexual Activity  . Alcohol use: No    Alcohol/week: 2.0 standard drinks    Types: 2 Standard drinks or equivalent per week    Comment: socially, not with preg  . Drug use: No  . Sexual activity: Not Currently    Birth control/protection: I.U.D.  Other Topics Concern  . Not on file  Social History Narrative  . Not on file   Social Determinants of Health   Financial Resource Strain: Not on file  Food Insecurity: Not on file  Transportation Needs: Not on file  Physical Activity: Not on file  Stress: Not on file  Social Connections: Not on file  Intimate Partner Violence: Not on file    Past Medical History, Surgical history, Social history, and Family history were reviewed and updated as appropriate.   Please see review of systems for further details on the patient's review from today.   Objective:   Physical Exam:  There were no vitals taken for this visit.  Physical Exam Constitutional:      General: She is not in acute distress. Musculoskeletal:        General: No deformity.  Neurological:     Mental Status: She is alert and oriented to person, place, and time.     Coordination: Coordination normal.  Psychiatric:        Attention and Perception: Attention and perception normal. She does not perceive auditory or visual hallucinations.        Mood and Affect: Mood normal. Mood is not anxious or depressed. Affect is not labile, blunt, angry or inappropriate.        Speech: Speech normal.        Behavior: Behavior normal.         Thought Content: Thought content normal. Thought content is not paranoid or delusional. Thought content does not include homicidal or suicidal ideation. Thought content does not include homicidal or suicidal plan.        Cognition and Memory: Cognition and memory normal.        Judgment: Judgment normal.     Comments: Insight intact     Lab Review:     Component Value Date/Time   NA 139 08/30/2020 1050   NA 141 06/29/2017 1224   K 3.6 08/30/2020 1050   CL 103 08/30/2020 1050   CO2 22 08/30/2020 1050   GLUCOSE 140 (H) 08/30/2020 1050   BUN 8 08/30/2020 1050   BUN 8 06/29/2017 1224   CREATININE 1.01 (H) 08/30/2020 1050  CALCIUM 9.7 08/30/2020 1050   PROT 7.3 08/23/2020 0753   PROT 7.1 06/29/2017 1224   ALBUMIN 4.0 08/23/2020 0753   ALBUMIN 4.5 06/29/2017 1224   AST 77 (H) 08/23/2020 0753   ALT 48 (H) 08/23/2020 0753   ALKPHOS 96 08/23/2020 0753   BILITOT 1.0 08/23/2020 0753   BILITOT 0.8 06/29/2017 1224   GFRNONAA >60 08/30/2020 1050   GFRAA 133 06/29/2017 1224       Component Value Date/Time   WBC 11.7 (H) 08/30/2020 1050   RBC 4.90 08/30/2020 1050   HGB 14.5 08/30/2020 1050   HGB 13.7 06/29/2017 1224   HGB 12.7 06/06/2015 0000   HCT 43.9 08/30/2020 1050   HCT 41.0 06/29/2017 1224   HCT 37 06/06/2015 0000   PLT 480 (H) 08/30/2020 1050   PLT 312 06/29/2017 1224   PLT 300 06/06/2015 0000   MCV 89.6 08/30/2020 1050   MCV 92.5 02/16/2018 1224   MCV 93 06/29/2017 1224   MCH 29.6 08/30/2020 1050   MCHC 33.0 08/30/2020 1050   RDW 12.4 08/30/2020 1050   RDW 13.3 06/29/2017 1224   LYMPHSABS 1.9 08/30/2020 1050   LYMPHSABS 1.7 06/29/2017 1224   MONOABS 0.6 08/30/2020 1050   EOSABS 0.0 08/30/2020 1050   EOSABS 0.2 06/29/2017 1224   BASOSABS 0.1 08/30/2020 1050   BASOSABS 0.0 06/29/2017 1224    No results found for: POCLITH, LITHIUM   No results found for: PHENYTOIN, PHENOBARB, VALPROATE, CBMZ   .res Assessment: Plan:    Plan:  PDMP reviewed  Current  medications:  1. Lorazepam 0.5mg  TID - will send in a 2 week supply with a refill. 2. Seroquel 25 mg - 2 at bedtime - taking one at bedtime.  Consider Vyvanse  Seeing therapist - Elio Forget.  Patient advised to contact office with any questions, adverse effects, or acute worsening in signs and symptoms.  Discussed potential benefits, risks, and side effects of stimulants with patient to include increased heart rate, palpitations, insomnia, increased anxiety, increased irritability, or decreased appetite.  Instructed patient to contact office if experiencing any significant tolerability issues.  Discussed potential metabolic side effects associated with atypical antipsychotics, as well as potential risk for movement side effects. Advised pt to contact office if movement side effects occur.   Discussed potential benefits, risk, and side effects of benzodiazepines to include potential risk of tolerance and dependence, as well as possible drowsiness.  Advised patient not to drive if experiencing drowsiness and to take lowest possible effective dose to minimize risk of dependence and tolerance.   There are no diagnoses linked to this encounter.   Please see After Visit Summary for patient specific instructions.  Future Appointments  Date Time Provider Department Center  11/07/2020 10:00 AM Waldron Session, [facility] None  11/14/2020  8:30 AM Charlesetta Ivory, NP [facility] None    No orders of the defined types were placed in this encounter.     -------------------------------

## 2020-11-01 ENCOUNTER — Telehealth: Payer: Self-pay | Admitting: Adult Health

## 2020-11-01 ENCOUNTER — Other Ambulatory Visit: Payer: Self-pay | Admitting: Adult Health

## 2020-11-01 DIAGNOSIS — F411 Generalized anxiety disorder: Secondary | ICD-10-CM

## 2020-11-01 DIAGNOSIS — F331 Major depressive disorder, recurrent, moderate: Secondary | ICD-10-CM

## 2020-11-01 MED ORDER — QUETIAPINE FUMARATE 25 MG PO TABS: 25.0000 mg | ORAL_TABLET | Freq: Two times a day (BID) | ORAL | 2 refills | Status: DC

## 2020-11-01 MED ORDER — LORAZEPAM 0.5 MG PO TABS: ORAL_TABLET | ORAL | 1 refills | Status: DC

## 2020-11-01 NOTE — Telephone Encounter (Signed)
[name] [name] was just seen by you and wanted to remind you about her refills on Lorazepam and Quetiapine called to [facility] on [address], phone #[phone].

## 2020-11-01 NOTE — Telephone Encounter (Signed)
Scripts sent

## 2020-11-07 ENCOUNTER — Ambulatory Visit: Payer: BC Managed Care – PPO | Admitting: Mental Health

## 2020-11-14 ENCOUNTER — Encounter: Payer: BC Managed Care – PPO | Admitting: Nurse Practitioner

## 2020-11-16 ENCOUNTER — Telehealth: Payer: Self-pay | Admitting: Adult Health

## 2020-11-16 ENCOUNTER — Encounter: Payer: Self-pay | Admitting: Adult Health

## 2020-11-16 NOTE — Telephone Encounter (Signed)
Pt would like a rx for adderall that was discussed in last visit to be called in at [facility] on [address]

## 2020-11-16 NOTE — Telephone Encounter (Signed)
Will discuss restarting a next visit.

## 2020-11-20 DIAGNOSIS — Z20822 Contact with and (suspected) exposure to covid-19: Secondary | ICD-10-CM | POA: Diagnosis not present

## 2020-11-27 ENCOUNTER — Ambulatory Visit (INDEPENDENT_AMBULATORY_CARE_PROVIDER_SITE_OTHER): Payer: BC Managed Care – PPO | Admitting: Mental Health

## 2020-11-27 DIAGNOSIS — F411 Generalized anxiety disorder: Secondary | ICD-10-CM | POA: Diagnosis not present

## 2020-11-27 NOTE — Progress Notes (Signed)
[facility] Note  Name: [name] [name] Date: 11/28/2020 MRN: [id number] DOB: [birth year] PCP: Patient, No Pcp Per  Time spent: 53 minutes  Treatment:  Individual therapy  Mental Status Exam:    Appearance:    Casual     Behavior:   Appropriate  Motor:   WNL  Speech/Language:    Clear and Coherent  Affect:   constricted  Mood:   Anxious, depressed  Thought process:   normal  Thought content:     WNL  Sensory/Perceptual disturbances:     none  Orientation:   x4  Attention:   Good  Concentration:   Good  Memory:   Intact  Fund of knowledge:    Consistent with age and development  Insight:     developing  Judgment:    Good  Impulse Control:   Good    Reported Symptoms:  Appetite disturbance, sleep disturbance, anxiety (8/10), depression (8/10), low motivation, isolative,   Risk Assessment: Danger to Self:  No Self-injurious Behavior: No Danger to Others: No Duty to Warn:no Physical Aggression / Violence:No  Access to Firearms a concern: No  Gang Involvement:No  Patient / guardian was educated about steps to take if suicide or homicide risk level increases between visits:   While future psychiatric events cannot be accurately predicted, the patient does not currently require acute inpatient psychiatric care and does not currently meet [facility] involuntary commitment criteria.    Medical History/Surgical History:  Past Medical History:  Diagnosis Date  . ADHD (attention deficit hyperactivity disorder)   . Anxiety   . Panic attacks   . Serotonin syndrome   . Vaginal Pap smear, abnormal     Past Surgical History:  Procedure Laterality Date  . DILATION AND CURETTAGE OF UTERUS  01/04/16  . DILATION AND EVACUATION N/A 01/04/2016   Procedure: DILATATION AND EVACUATION;  Surgeon: Candice Camp, MD;  Location: [facility];  Service: Gynecology;  Laterality: N/A;  . GUM SURGERY  01/2017  . INDUCED ABORTION    . wisdom teeth removal Bilateral    2010     Medications: Current Outpatient Medications  Medication Sig Dispense Refill  . albuterol (VENTOLIN HFA) 108 (90 Base) MCG/ACT inhaler Inhale 1-2 puffs into the lungs every 6 (six) hours as needed for wheezing or shortness of breath. 18 g 3  . fluticasone (FLONASE) 50 MCG/ACT nasal spray USE 1 SPRAY IN EACH NOSTRIL TWICE A DAY. 16 g 2  . LORazepam (ATIVAN) 0.5 MG tablet Take one tablet three times daily as needed for anxiety. 45 tablet 1  . QUEtiapine (SEROQUEL) 25 MG tablet Take 1 tablet (25 mg total) by mouth 2 (two) times daily. 60 tablet 2   No current facility-administered medications for this visit.    Allergies  Allergen Reactions  . Ceclor [Cefaclor] Hives    Age 30 mild rash Tolerates ceftriaxone  . Latex Other (See Comments)    Irritation on skin  . Zofran [Ondansetron]     Per mother. Patient has serotonin syndrome and can not have Zofran.      Subjective:  Patient engaged in telehealth session via video. She stated she has struggled w/ some motivation, allowed her daughter to stay home a few days more after being sick recently. Finds herself avoiding  Taking care of some projects around the house that have been living for the last several weeks, months.  She went on to share how she continues to have some marital strain, while acknowledging that her husband  has been coping with some increased stress due to work demands.  Facilitated her identifying needs, engaging in some problem-solving towards identifying specific steps she may need to take to organize and follow through with beginning the home projects.  She shared how she will feel better if she was able to get started and complete some of these tasks.  She stated she was able to follow through with tackling her children's play room, packing away many of the toys with which they were not using which helped with decluttering.  She shared how she feels more energetic and upbeat when she feels she is accomplish some of these  tasks and plans to follow through more between sessions.  Interventions: Further assessment, CBT, supportive therapy, solution focused  Diagnoses:    ICD-10-CM   1. Generalized anxiety disorder  F41.1     Plan: Patient is to use CBT, mindfulness and coping skills to help manage decrease symptoms associated with their diagnosis.    Patient to follow through by adhering to a consistent sleep schedule, getting to bed on time earlier in the evenings.  Patient to follow through with completing 3 specific tasks per day to slowly improve her functioning daily.   Long-term goal:   Reduce overall level, frequency, and intensity of the feelings of depression and anxiety 8-9/10 to a 0-2/10 in severity for at least 3 consecutive months per patient report   Short-term goal:  Increase ability to relax by watching movies, using blankets for comfort.  Increase functioning daily evidenced by such tasks as making lunch for her kids, cleaning when needed, achieving at least 3 tasks per/day Improve sleep by going to bed early, waking at night from about 1am-4am, then goes back to bed for a few hours.  Improve coping skills as identified in session to decrease panic attacks, depression symptoms.    Assessment of progress:  progressing   Waldron Session, [facility]

## 2020-12-03 ENCOUNTER — Encounter: Payer: Self-pay | Admitting: Adult Health

## 2020-12-03 ENCOUNTER — Other Ambulatory Visit: Payer: Self-pay

## 2020-12-03 ENCOUNTER — Ambulatory Visit (INDEPENDENT_AMBULATORY_CARE_PROVIDER_SITE_OTHER): Payer: BC Managed Care – PPO | Admitting: Adult Health

## 2020-12-03 ENCOUNTER — Ambulatory Visit (INDEPENDENT_AMBULATORY_CARE_PROVIDER_SITE_OTHER): Payer: BC Managed Care – PPO | Admitting: Mental Health

## 2020-12-03 DIAGNOSIS — F411 Generalized anxiety disorder: Secondary | ICD-10-CM

## 2020-12-03 DIAGNOSIS — F331 Major depressive disorder, recurrent, moderate: Secondary | ICD-10-CM

## 2020-12-03 DIAGNOSIS — F909 Attention-deficit hyperactivity disorder, unspecified type: Secondary | ICD-10-CM | POA: Diagnosis not present

## 2020-12-03 DIAGNOSIS — F428 Other obsessive-compulsive disorder: Secondary | ICD-10-CM

## 2020-12-03 NOTE — Progress Notes (Signed)
[facility] Note  Name: RAMAYA GUILE Date: 12/03/2020 MRN: [id number] DOB: December 19, 1990 PCP: Patient, No Pcp Per  Time spent: 30 minutes  Treatment:  Individual therapy  Mental Status Exam:    Appearance:    Casual     Behavior:   Appropriate  Motor:   WNL  Speech/Language:    Clear and Coherent  Affect:   constricted  Mood:   Pleasant, euthymic  Thought process:   normal  Thought content:     WNL  Sensory/Perceptual disturbances:     none  Orientation:   x4  Attention:   Good  Concentration:   Good  Memory:   Intact  Fund of knowledge:    Consistent with age and development  Insight:     developing  Judgment:    Good  Impulse Control:   Good    Reported Symptoms:  Appetite disturbance, sleep disturbance, anxiety (8/10), depression (8/10), low motivation, isolative  Risk Assessment: Danger to Self:  No Self-injurious Behavior: No Danger to Others: No Duty to Warn:no Physical Aggression / Violence:No  Access to Firearms a concern: No  Gang Involvement:No  Patient / guardian was educated about steps to take if suicide or homicide risk level increases between visits:   While future psychiatric events cannot be accurately predicted, the patient does not currently require acute inpatient psychiatric care and does not currently meet [facility] involuntary commitment criteria.    Medical History/Surgical History:  Past Medical History:  Diagnosis Date  . ADHD (attention deficit hyperactivity disorder)   . Anxiety   . Panic attacks   . Serotonin syndrome   . Vaginal Pap smear, abnormal     Medications: Current Outpatient Medications  Medication Sig Dispense Refill  . albuterol (VENTOLIN HFA) 108 (90 Base) MCG/ACT inhaler Inhale 1-2 puffs into the lungs every 6 (six) hours as needed for wheezing or shortness of breath. 18 g 3  . fluticasone (FLONASE) 50 MCG/ACT nasal spray USE 1 SPRAY IN EACH NOSTRIL TWICE A DAY. 16 g 2  . LORazepam (ATIVAN) 0.5 MG  tablet Take one tablet three times daily as needed for anxiety. 45 tablet 1  . QUEtiapine (SEROQUEL) 25 MG tablet Take 1 tablet (25 mg total) by mouth 2 (two) times daily. 60 tablet 2   No current facility-administered medications for this visit.    Subjective:  Patient engaged in telehealth session via telephone. She stated she has followed through after our last session and was highly effective in getting tasks/projects completed at home. She has been more able to assertive with her daughter therefore she went back to school and it getting back into the routine of going to school. She stated she has court in May due to being charged with a DUI last December. She shared feelings of guilt and shame about getting the charge.  She stated that she is trying not to over focus as it increases her anxiety and sad feelings.  We discussed thought stopping, STOPP skill to utilize to work to keep anxiety and stress low.  She shared how she plans to continue to stay organized and keep working on day-to-day tasks as she stated she has felt good about the progress she is making and has felt more motivation recently.    Interventions: Further assessment, CBT, supportive therapy, solution focused  Diagnoses:    ICD-10-CM   1. Obsessional thoughts  F42.8     Plan: Patient is to use CBT, mindfulness and coping skills to help manage  decrease symptoms associated with their diagnosis.    Patient to follow through by adhering to a consistent sleep schedule, getting to bed on time earlier in the evenings.  Patient to follow through with completing 3 specific tasks per day to slowly improve her functioning daily.   Long-term goal:   Reduce overall level, frequency, and intensity of the feelings of depression and anxiety 8-9/10 to a 0-2/10 in severity for at least 3 consecutive months per patient report   Short-term goal:  Increase ability to relax by identifying activities Increase functioning daily evidenced by  such tasks as making lunch for her kids, cleaning when needed, achieving at least 3 tasks per/day Improve sleep by going to bed early, waking at night from about 1am-4am, then goes back to bed for a few hours.  Improve coping skills as identified in session to decrease panic attacks, depression symptoms.    Assessment of progress:  progressing   Waldron Session, [facility]

## 2020-12-03 NOTE — Progress Notes (Signed)
[name] [name] [id number] [birth year] 30 y.o.  Subjective:   Patient ID:  [name] [name] is a 30 y.o. (DOB [birth year]) female.  Chief Complaint: No chief complaint on file.   HPI [name] [name] presents to the office today for follow-up of MDD, GAD, Obsessional thoughts, and ADHD.  Describes mood today as "ok". Pleasant. Decreased tearfulness. Mood symptoms - reports decreased depression, anxiety and irritability.nDenies recent panic attacks. Stating "I am doing better". Feels "stressed", but is pushing through. Getting projects done around the house. Getting out with people - reconnecting. Plans to start working. Felt like she was "overly stressed" previously. Inquiring about restarting Adderall for focus and concentration issues. Needs to complete some continuing education and is concerned about getting it completed. Seeing Elio Forget - therapist. Improved interest and motivation.  Energy levels improved. Active, does not have a regular exercise routine. Enjoys some usual interests and activities. Married, but separated. Lives with 2 daughters - 4 and 6. Family local and supportive.  Appetite adequate. Weight stable - 200 pounds.  Sleeps well most nights. Averges 8 or more hours. Focus and concentration difficulties - feels like she needs to restart medications. Difficulties starting and stopping tasks. Needing to do continuing education. Wanting to return to work. Completing tasks. Managing some aspects of household.  Denies SI or HI.  Denies AH or VH.  Previous medication trials: Adderall, Clonazepam, Lorazepam, Trazadone, Lexapro, Paxil, Zoloft.   PHQ2-9   Flowsheet Row Video Visit from 01/03/2020 in Primary Care at [facility] Visit from 05/16/2019 in Primary Care at [facility] Telemedicine from 01/14/2019 in Primary Care at [facility] Visit from 08/06/2018 in Primary Care at [facility] Visit from 07/09/2018 in Primary Care at [facility] Total Score 0 0 0 0 0       Review of  Systems:  Review of Systems  Musculoskeletal: Negative for gait problem.  Neurological: Negative for tremors.  Psychiatric/Behavioral:       Please refer to HPI    Medications: I have reviewed the patient's current medications.  Current Outpatient Medications  Medication Sig Dispense Refill  . albuterol (VENTOLIN HFA) 108 (90 Base) MCG/ACT inhaler Inhale 1-2 puffs into the lungs every 6 (six) hours as needed for wheezing or shortness of breath. 18 g 3  . fluticasone (FLONASE) 50 MCG/ACT nasal spray USE 1 SPRAY IN EACH NOSTRIL TWICE A DAY. 16 g 2  . LORazepam (ATIVAN) 0.5 MG tablet Take one tablet three times daily as needed for anxiety. 45 tablet 1  . QUEtiapine (SEROQUEL) 25 MG tablet Take 1 tablet (25 mg total) by mouth 2 (two) times daily. 60 tablet 2   No current facility-administered medications for this visit.    Medication Side Effects: None  Allergies:  Allergies  Allergen Reactions  . Ceclor [Cefaclor] Hives    Age 79 mild rash Tolerates ceftriaxone  . Latex Other (See Comments)    Irritation on skin  . Zofran [Ondansetron]     Per mother. Patient has serotonin syndrome and can not have Zofran.     Past Medical History:  Diagnosis Date  . ADHD (attention deficit hyperactivity disorder)   . Anxiety   . Panic attacks   . Serotonin syndrome   . Vaginal Pap smear, abnormal     Family History  Problem Relation Age of Onset  . Hypertension Mother   . Heart disease Father   . Cancer Father        testicular  . Hypertension Father   .  Hyperlipidemia Father   . Heart disease Paternal Grandfather     Social History   Socioeconomic History  . Marital status: Married    Spouse name: Not on file  . Number of children: Not on file  . Years of education: Not on file  . Highest education level: Not on file  Occupational History  . Not on file  Tobacco Use  . Smoking status: Former Smoker    Types: Cigarettes    Quit date: 08/15/2013    Years since quitting:  7.3  . Smokeless tobacco: Never Used  Substance and Sexual Activity  . Alcohol use: No    Alcohol/week: 2.0 standard drinks    Types: 2 Standard drinks or equivalent per week    Comment: socially, not with preg  . Drug use: No  . Sexual activity: Not Currently    Birth control/protection: I.U.D.  Other Topics Concern  . Not on file  Social History Narrative  . Not on file   Social Determinants of Health   Financial Resource Strain: Not on file  Food Insecurity: Not on file  Transportation Needs: Not on file  Physical Activity: Not on file  Stress: Not on file  Social Connections: Not on file  Intimate Partner Violence: Not on file    Past Medical History, Surgical history, Social history, and Family history were reviewed and updated as appropriate.   Please see review of systems for further details on the patient's review from today.   Objective:   Physical Exam:  There were no vitals taken for this visit.  Physical Exam Constitutional:      General: She is not in acute distress. Musculoskeletal:        General: No deformity.  Neurological:     Mental Status: She is alert and oriented to person, place, and time.     Coordination: Coordination normal.  Psychiatric:        Attention and Perception: Attention and perception normal. She does not perceive auditory or visual hallucinations.        Mood and Affect: Mood normal. Mood is not anxious or depressed. Affect is not labile, blunt, angry or inappropriate.        Speech: Speech normal.        Behavior: Behavior normal.        Thought Content: Thought content normal. Thought content is not paranoid or delusional. Thought content does not include homicidal or suicidal ideation. Thought content does not include homicidal or suicidal plan.        Cognition and Memory: Cognition and memory normal.        Judgment: Judgment normal.     Comments: Insight intact     Lab Review:     Component Value Date/Time   NA 139  08/30/2020 1050   NA 141 06/29/2017 1224   K 3.6 08/30/2020 1050   CL 103 08/30/2020 1050   CO2 22 08/30/2020 1050   GLUCOSE 140 (H) 08/30/2020 1050   BUN 8 08/30/2020 1050   BUN 8 06/29/2017 1224   CREATININE 1.01 (H) 08/30/2020 1050   CALCIUM 9.7 08/30/2020 1050   PROT 7.3 08/23/2020 0753   PROT 7.1 06/29/2017 1224   ALBUMIN 4.0 08/23/2020 0753   ALBUMIN 4.5 06/29/2017 1224   AST 77 (H) 08/23/2020 0753   ALT 48 (H) 08/23/2020 0753   ALKPHOS 96 08/23/2020 0753   BILITOT 1.0 08/23/2020 0753   BILITOT 0.8 06/29/2017 1224   GFRNONAA >60 08/30/2020 1050  GFRAA 133 06/29/2017 1224       Component Value Date/Time   WBC 11.7 (H) 08/30/2020 1050   RBC 4.90 08/30/2020 1050   HGB 14.5 08/30/2020 1050   HGB 13.7 06/29/2017 1224   HGB 12.7 06/06/2015 0000   HCT 43.9 08/30/2020 1050   HCT 41.0 06/29/2017 1224   HCT 37 06/06/2015 0000   PLT 480 (H) 08/30/2020 1050   PLT 312 06/29/2017 1224   PLT 300 06/06/2015 0000   MCV 89.6 08/30/2020 1050   MCV 92.5 02/16/2018 1224   MCV 93 06/29/2017 1224   MCH 29.6 08/30/2020 1050   MCHC 33.0 08/30/2020 1050   RDW 12.4 08/30/2020 1050   RDW 13.3 06/29/2017 1224   LYMPHSABS 1.9 08/30/2020 1050   LYMPHSABS 1.7 06/29/2017 1224   MONOABS 0.6 08/30/2020 1050   EOSABS 0.0 08/30/2020 1050   EOSABS 0.2 06/29/2017 1224   BASOSABS 0.1 08/30/2020 1050   BASOSABS 0.0 06/29/2017 1224    No results found for: POCLITH, LITHIUM   No results found for: PHENYTOIN, PHENOBARB, VALPROATE, CBMZ   .res Assessment: Plan:    Plan:  PDMP reviewed  Current medications:  1. Lorazepam 0.5mg  TID - will send in a 2 week supply with a refill. 2. Seroquel 25 mg - 2 at bedtime - taking one at bedtime.  Discussed restarting stimulant - will not add stimulant in at this time. Discussed recent psychosis and hospitalization and possible recurrence of psychosis.   Discussed appointment with a Life coach  Seeing therapist - Elio Forget.  Patient advised  to contact office with any questions, adverse effects, or acute worsening in signs and symptoms.  Discussed potential benefits, risks, and side effects of stimulants with patient to include increased heart rate, palpitations, insomnia, increased anxiety, increased irritability, or decreased appetite.  Instructed patient to contact office if experiencing any significant tolerability issues.  Discussed potential metabolic side effects associated with atypical antipsychotics, as well as potential risk for movement side effects. Advised pt to contact office if movement side effects occur.   Discussed potential benefits, risk, and side effects of benzodiazepines to include potential risk of tolerance and dependence, as well as possible drowsiness.  Advised patient not to drive if experiencing drowsiness and to take lowest possible effective dose to minimize risk of dependence and tolerance.  RTC 2 weeks  Diagnoses and all orders for this visit:  Obsessional thoughts  Attention deficit hyperactivity disorder (ADHD), unspecified ADHD type  Major depressive disorder, recurrent episode, moderate (HCC)  Generalized anxiety disorder     Please see After Visit Summary for patient specific instructions.  Future Appointments  Date Time Provider Department Center  12/03/2020 10:00 AM Waldron Session, [facility] CP-[facility] None  12/10/2020  9:00 AM Waldron Session, [facility] CP-CP None  12/17/2020  8:40 AM Klarissa Mcilvain, Thereasa Solo, NP CP-CP None  12/17/2020  2:00 PM Waldron Session, [facility] CP-CP None    No orders of the defined types were placed in this encounter.   -------------------------------

## 2020-12-10 ENCOUNTER — Ambulatory Visit: Payer: BC Managed Care – PPO | Admitting: Mental Health

## 2020-12-17 ENCOUNTER — Encounter: Payer: Self-pay | Admitting: Adult Health

## 2020-12-17 ENCOUNTER — Ambulatory Visit: Payer: BC Managed Care – PPO | Admitting: Mental Health

## 2020-12-17 ENCOUNTER — Other Ambulatory Visit: Payer: Self-pay | Admitting: Adult Health

## 2020-12-17 ENCOUNTER — Ambulatory Visit (INDEPENDENT_AMBULATORY_CARE_PROVIDER_SITE_OTHER): Payer: BC Managed Care – PPO | Admitting: Adult Health

## 2020-12-17 ENCOUNTER — Other Ambulatory Visit: Payer: Self-pay

## 2020-12-17 DIAGNOSIS — F331 Major depressive disorder, recurrent, moderate: Secondary | ICD-10-CM

## 2020-12-17 DIAGNOSIS — F909 Attention-deficit hyperactivity disorder, unspecified type: Secondary | ICD-10-CM | POA: Diagnosis not present

## 2020-12-17 DIAGNOSIS — F411 Generalized anxiety disorder: Secondary | ICD-10-CM

## 2020-12-17 DIAGNOSIS — F428 Other obsessive-compulsive disorder: Secondary | ICD-10-CM | POA: Diagnosis not present

## 2020-12-17 MED ORDER — LORAZEPAM 1 MG PO TABS: ORAL_TABLET | ORAL | 2 refills | Status: DC

## 2020-12-17 MED ORDER — QUETIAPINE FUMARATE 25 MG PO TABS: 25.0000 mg | ORAL_TABLET | Freq: Two times a day (BID) | ORAL | 2 refills | Status: DC

## 2020-12-17 NOTE — Progress Notes (Signed)
[name] [name] [id number] 13-Dec-1990 30 y.o.  Subjective:   Patient ID:  [name] [name] is a 30 y.o. (DOB 11-09-1990) female.  Chief Complaint: No chief complaint on file.   HPI   Accompanied by mother - feels like she is doing well.   [name] [name] presents to the office today for follow-up of MDD, GAD, Obsessional thoughts, and ADHD.  Describes mood today as "ok". Pleasant. Mood symptoms - denies depression, anxiety and irritability. Denies recent panic attacks. Stating "I'm doing great". Has been pushing herself to do more. Getting more done around the house. Plans to see a life coach. Seeing Elio Forget - therapist. Improved interest and motivation.  Energy levels improved. Active, does not have a regular exercise routine. Enjoys some usual interests and activities. Married, but separated. Lives with 2 daughters - 4 and 6. Family local and supportive.  Appetite adequate. Weight stable - 200 pounds.  Sleeps well most nights. Averges 10 hours.  Focus and concentration difficulties - "working on it". Completing tasks. Managing some aspects of household.  Denies SI or HI.  Denies AH or VH.  Previous medication trials: Adderall, Clonazepam, Lorazepam, Trazadone, Lexapro, Paxil, Zoloft.   PHQ2-9   Flowsheet Row Video Visit from 01/03/2020 in Primary Care at [facility] Visit from 05/16/2019 in Primary Care at [facility] Telemedicine from 01/14/2019 in Primary Care at [facility] Visit from 08/06/2018 in Primary Care at [facility] Visit from 07/09/2018 in Primary Care at [facility] Total Score 0 0 0 0 0       Review of Systems:  Review of Systems  Musculoskeletal: Negative for gait problem.  Neurological: Negative for tremors.  Psychiatric/Behavioral:       Please refer to HPI    Medications: I have reviewed the patient's current medications.  Current Outpatient Medications  Medication Sig Dispense Refill  . albuterol (VENTOLIN HFA) 108 (90 Base) MCG/ACT inhaler Inhale  1-2 puffs into the lungs every 6 (six) hours as needed for wheezing or shortness of breath. 18 g 3  . fluticasone (FLONASE) 50 MCG/ACT nasal spray USE 1 SPRAY IN EACH NOSTRIL TWICE A DAY. 16 g 2  . LORazepam (ATIVAN) 1 MG tablet Take one tablet three times daily as needed for anxiety. 45 tablet 2  . QUEtiapine (SEROQUEL) 25 MG tablet Take 1 tablet (25 mg total) by mouth 2 (two) times daily. 60 tablet 2   No current facility-administered medications for this visit.    Medication Side Effects: None  Allergies:  Allergies  Allergen Reactions  . Ceclor [Cefaclor] Hives    Age 30 mild rash Tolerates ceftriaxone  . Latex Other (See Comments)    Irritation on skin  . Zofran [Ondansetron]     Per mother. Patient has serotonin syndrome and can not have Zofran.     Past Medical History:  Diagnosis Date  . ADHD (attention deficit hyperactivity disorder)   . Anxiety   . Panic attacks   . Serotonin syndrome   . Vaginal Pap smear, abnormal     Family History  Problem Relation Age of Onset  . Hypertension Mother   . Heart disease Father   . Cancer Father        testicular  . Hypertension Father   . Hyperlipidemia Father   . Heart disease Paternal Grandfather     Social History   Socioeconomic History  . Marital status: Married    Spouse name: Not on file  . Number of children: Not on  file  . Years of education: Not on file  . Highest education level: Not on file  Occupational History  . Not on file  Tobacco Use  . Smoking status: Former Smoker    Types: Cigarettes    Quit date: 08/15/2013    Years since quitting: 7.3  . Smokeless tobacco: Never Used  Substance and Sexual Activity  . Alcohol use: No    Alcohol/week: 2.0 standard drinks    Types: 2 Standard drinks or equivalent per week    Comment: socially, not with preg  . Drug use: No  . Sexual activity: Not Currently    Birth control/protection: I.U.D.  Other Topics Concern  . Not on file  Social History Narrative   . Not on file   Social Determinants of Health   Financial Resource Strain: Not on file  Food Insecurity: Not on file  Transportation Needs: Not on file  Physical Activity: Not on file  Stress: Not on file  Social Connections: Not on file  Intimate Partner Violence: Not on file    Past Medical History, Surgical history, Social history, and Family history were reviewed and updated as appropriate.   Please see review of systems for further details on the patient's review from today.   Objective:   Physical Exam:  There were no vitals taken for this visit.  Physical Exam Constitutional:      General: She is not in acute distress. Musculoskeletal:        General: No deformity.  Neurological:     Mental Status: She is alert and oriented to person, place, and time.     Coordination: Coordination normal.  Psychiatric:        Attention and Perception: Attention and perception normal. She does not perceive auditory or visual hallucinations.        Mood and Affect: Mood normal. Mood is not anxious or depressed. Affect is not labile, blunt, angry or inappropriate.        Speech: Speech normal.        Behavior: Behavior normal.        Thought Content: Thought content normal. Thought content is not paranoid or delusional. Thought content does not include homicidal or suicidal ideation. Thought content does not include homicidal or suicidal plan.        Cognition and Memory: Cognition and memory normal.        Judgment: Judgment normal.     Comments: Insight intact     Lab Review:     Component Value Date/Time   NA 139 08/30/2020 1050   NA 141 06/29/2017 1224   K 3.6 08/30/2020 1050   CL 103 08/30/2020 1050   CO2 22 08/30/2020 1050   GLUCOSE 140 (H) 08/30/2020 1050   BUN 8 08/30/2020 1050   BUN 8 06/29/2017 1224   CREATININE 1.01 (H) 08/30/2020 1050   CALCIUM 9.7 08/30/2020 1050   PROT 7.3 08/23/2020 0753   PROT 7.1 06/29/2017 1224   ALBUMIN 4.0 08/23/2020 0753   ALBUMIN  4.5 06/29/2017 1224   AST 77 (H) 08/23/2020 0753   ALT 48 (H) 08/23/2020 0753   ALKPHOS 96 08/23/2020 0753   BILITOT 1.0 08/23/2020 0753   BILITOT 0.8 06/29/2017 1224   GFRNONAA >60 08/30/2020 1050   GFRAA 133 06/29/2017 1224       Component Value Date/Time   WBC 11.7 (H) 08/30/2020 1050   RBC 4.90 08/30/2020 1050   HGB 14.5 08/30/2020 1050   HGB 13.7 06/29/2017 1224  HGB 12.7 06/06/2015 0000   HCT 43.9 08/30/2020 1050   HCT 41.0 06/29/2017 1224   HCT 37 06/06/2015 0000   PLT 480 (H) 08/30/2020 1050   PLT 312 06/29/2017 1224   PLT 300 06/06/2015 0000   MCV 89.6 08/30/2020 1050   MCV 92.5 02/16/2018 1224   MCV 93 06/29/2017 1224   MCH 29.6 08/30/2020 1050   MCHC 33.0 08/30/2020 1050   RDW 12.4 08/30/2020 1050   RDW 13.3 06/29/2017 1224   LYMPHSABS 1.9 08/30/2020 1050   LYMPHSABS 1.7 06/29/2017 1224   MONOABS 0.6 08/30/2020 1050   EOSABS 0.0 08/30/2020 1050   EOSABS 0.2 06/29/2017 1224   BASOSABS 0.1 08/30/2020 1050   BASOSABS 0.0 06/29/2017 1224    No results found for: POCLITH, LITHIUM   No results found for: PHENYTOIN, PHENOBARB, VALPROATE, CBMZ   .res Assessment: Plan:     Plan:  PDMP reviewed  Current medications:  1. Lorazepam 0.5mg  TID tp 1,g TID - will send in a 2 week supply with a refill. 2. Seroquel 25 mg - 2 at bedtime - taking one at bedtime.  Discussed restarting stimulant - will not add stimulant in at this time. Discussed recent psychosis and hospitalization and possible recurrence of psychosis.   Discussed appointment with a Life coach  Seeing therapist - Elio Forget.  Patient advised to contact office with any questions, adverse effects, or acute worsening in signs and symptoms.  Discussed potential benefits, risks, and side effects of stimulants with patient to include increased heart rate, palpitations, insomnia, increased anxiety, increased irritability, or decreased appetite.  Instructed patient to contact office if experiencing  any significant tolerability issues.  Discussed potential metabolic side effects associated with atypical antipsychotics, as well as potential risk for movement side effects. Advised pt to contact office if movement side effects occur.   Discussed potential benefits, risk, and side effects of benzodiazepines to include potential risk of tolerance and dependence, as well as possible drowsiness.  Advised patient not to drive if experiencing drowsiness and to take lowest possible effective dose to minimize risk of dependence and tolerance.  RTC 2 weeks   Diagnoses and all orders for this visit:  Obsessional thoughts  Generalized anxiety disorder -     LORazepam (ATIVAN) 1 MG tablet; Take one tablet three times daily as needed for anxiety.  Major depressive disorder, recurrent episode, moderate (HCC) -     QUEtiapine (SEROQUEL) 25 MG tablet; Take 1 tablet (25 mg total) by mouth 2 (two) times daily.  Attention deficit hyperactivity disorder (ADHD), unspecified ADHD type     Please see After Visit Summary for patient specific instructions.  Future Appointments  Date Time Provider Department Center  12/17/2020  2:00 PM Waldron Session, [facility] CP-CP None    No orders of the defined types were placed in this encounter.   -------------------------------

## 2021-01-09 ENCOUNTER — Other Ambulatory Visit: Payer: Self-pay | Admitting: Adult Health

## 2021-01-09 ENCOUNTER — Telehealth: Payer: Self-pay | Admitting: Adult Health

## 2021-01-09 DIAGNOSIS — F411 Generalized anxiety disorder: Secondary | ICD-10-CM

## 2021-01-09 DIAGNOSIS — J9801 Acute bronchospasm: Secondary | ICD-10-CM

## 2021-01-09 MED ORDER — LORAZEPAM 1 MG PO TABS: ORAL_TABLET | ORAL | 2 refills | Status: DC

## 2021-01-09 NOTE — Telephone Encounter (Signed)
Script sent  

## 2021-01-09 NOTE — Telephone Encounter (Signed)
Pt called requesting 1 extra week of Lorazepam @ [facility] [address]. Stated you had told her if she needs anything to call. Contact # [phone].Due for 2 week follow up

## 2021-01-09 NOTE — Telephone Encounter (Signed)
Please review

## 2021-02-18 ENCOUNTER — Other Ambulatory Visit: Payer: Self-pay

## 2021-02-18 ENCOUNTER — Ambulatory Visit (INDEPENDENT_AMBULATORY_CARE_PROVIDER_SITE_OTHER): Payer: BC Managed Care – PPO | Admitting: Adult Health

## 2021-02-18 ENCOUNTER — Encounter: Payer: Self-pay | Admitting: Adult Health

## 2021-02-18 DIAGNOSIS — F331 Major depressive disorder, recurrent, moderate: Secondary | ICD-10-CM | POA: Diagnosis not present

## 2021-02-18 DIAGNOSIS — F411 Generalized anxiety disorder: Secondary | ICD-10-CM | POA: Diagnosis not present

## 2021-02-18 MED ORDER — LORAZEPAM 1 MG PO TABS
ORAL_TABLET | ORAL | 3 refills | Status: DC
Start: 2021-02-18 — End: 2021-04-01

## 2021-02-18 MED ORDER — QUETIAPINE FUMARATE 25 MG PO TABS: 25.0000 mg | ORAL_TABLET | Freq: Two times a day (BID) | ORAL | 2 refills | Status: DC

## 2021-02-18 NOTE — Progress Notes (Signed)
[name] [name] [id number] 1991-01-12 30 y.o.  Subjective:   Patient ID:  [name] [name] is a 30 y.o. (DOB 05-17-1991) female.  Chief Complaint: No chief complaint on file.   HPI [name] [name] presents to the office today for follow-up of MDD, GAD, Obsessional thoughts, and ADHD.  Describes mood today as "ok". Pleasant. Mood symptoms - denies depression, anxiety and irritability. Denies panic attacks - "gets stressed out times". Stating "I'm doing good". Still struggling with low energy levels - plans to PCP for TSH, vitamin D, and Vitamin B levels. [place] and daughters. Seeing Elio Forget - therapist. Improved interest and motivation.  Energy levels improved - "it's ok". Active, does not have a regular exercise routine. Enjoys some usual interests and activities. Married, but separated. Lives with 2 daughters. Family local and supportive.  Appetite adequate. Weight stable - 200+ pounds.  Sleeps well most nights. Averges 10 or more hours.  Focus and concentration difficulties - "still a struggle". Completing tasks. Managing some aspects of household.  Denies SI or HI.  Denies AH or VH.  Previous medication trials: Adderall, Clonazepam, Lorazepam, Trazadone, Lexapro, Paxil, Zoloft.     PHQ2-9   Flowsheet Row Video Visit from 01/03/2020 in Primary Care at [facility] Visit from 05/16/2019 in Primary Care at [facility] [place] [facility] Telemedicine from 01/14/2019 in Primary Care at [facility] Visit from 08/06/2018 in Primary Care at [facility] Visit from 07/09/2018 in Primary Care at [facility] Total Score 0 0 0 0 0       Review of Systems:  Review of Systems  Musculoskeletal: Negative for gait problem.  Neurological: Negative for tremors.  Psychiatric/Behavioral:       Please refer to HPI    Medications: I have reviewed the patient's current medications.  Current Outpatient Medications  Medication Sig Dispense Refill  . albuterol (VENTOLIN HFA) 108 (90  Base) MCG/ACT inhaler Inhale 1-2 puffs into the lungs every 6 (six) hours as needed for wheezing or shortness of breath. 18 g 3  . fluticasone (FLONASE) 50 MCG/ACT nasal spray USE 1 SPRAY IN EACH NOSTRIL TWICE A DAY. 16 g 2  . LORazepam (ATIVAN) 1 MG tablet Take one tablet three times daily as needed for anxiety. 45 tablet 3  . QUEtiapine (SEROQUEL) 25 MG tablet Take 1 tablet (25 mg total) by mouth 2 (two) times daily. 60 tablet 2   No current facility-administered medications for this visit.    Medication Side Effects: None  Allergies:  Allergies  Allergen Reactions  . Ceclor [Cefaclor] Hives    Age 56 mild rash Tolerates ceftriaxone  . Latex Other (See Comments)    Irritation on skin  . Zofran [Ondansetron]     Per mother. Patient has serotonin syndrome and can not have Zofran.     Past Medical History:  Diagnosis Date  . ADHD (attention deficit hyperactivity disorder)   . Anxiety   . Panic attacks   . Serotonin syndrome   . Vaginal Pap smear, abnormal     Past Medical History, Surgical history, Social history, and Family history were reviewed and updated as appropriate.   Please see review of systems for further details on the patient's review from today.   Objective:   Physical Exam:  There were no vitals taken for this visit.  Physical Exam Constitutional:      General: She is not in acute distress. Musculoskeletal:        General: No deformity.  Neurological:     Mental Status: She is alert and oriented to person, place, and time.     Coordination: Coordination normal.  Psychiatric:        Attention and Perception: Attention and perception normal. She does not perceive auditory or visual hallucinations.        Mood and Affect: Mood normal. Mood is not anxious or depressed. Affect is not labile, blunt, angry or inappropriate.        Speech: Speech normal.        Behavior: Behavior normal.        Thought Content: Thought content normal. Thought content is not  paranoid or delusional. Thought content does not include homicidal or suicidal ideation. Thought content does not include homicidal or suicidal plan.        Cognition and Memory: Cognition and memory normal.        Judgment: Judgment normal.     Comments: Insight intact     Lab Review:     Component Value Date/Time   NA 139 08/30/2020 1050   NA 141 06/29/2017 1224   K 3.6 08/30/2020 1050   CL 103 08/30/2020 1050   CO2 22 08/30/2020 1050   GLUCOSE 140 (H) 08/30/2020 1050   BUN 8 08/30/2020 1050   BUN 8 06/29/2017 1224   CREATININE 1.01 (H) 08/30/2020 1050   CALCIUM 9.7 08/30/2020 1050   PROT 7.3 08/23/2020 0753   PROT 7.1 06/29/2017 1224   ALBUMIN 4.0 08/23/2020 0753   ALBUMIN 4.5 06/29/2017 1224   AST 77 (H) 08/23/2020 0753   ALT 48 (H) 08/23/2020 0753   ALKPHOS 96 08/23/2020 0753   BILITOT 1.0 08/23/2020 0753   BILITOT 0.8 06/29/2017 1224   GFRNONAA >60 08/30/2020 1050   GFRAA 133 06/29/2017 1224       Component Value Date/Time   WBC 11.7 (H) 08/30/2020 1050   RBC 4.90 08/30/2020 1050   HGB 14.5 08/30/2020 1050   HGB 13.7 06/29/2017 1224   HGB 12.7 06/06/2015 0000   HCT 43.9 08/30/2020 1050   HCT 41.0 06/29/2017 1224   HCT 37 06/06/2015 0000   PLT 480 (H) 08/30/2020 1050   PLT 312 06/29/2017 1224   PLT 300 06/06/2015 0000   MCV 89.6 08/30/2020 1050   MCV 92.5 02/16/2018 1224   MCV 93 06/29/2017 1224   MCH 29.6 08/30/2020 1050   MCHC 33.0 08/30/2020 1050   RDW 12.4 08/30/2020 1050   RDW 13.3 06/29/2017 1224   LYMPHSABS 1.9 08/30/2020 1050   LYMPHSABS 1.7 06/29/2017 1224   MONOABS 0.6 08/30/2020 1050   EOSABS 0.0 08/30/2020 1050   EOSABS 0.2 06/29/2017 1224   BASOSABS 0.1 08/30/2020 1050   BASOSABS 0.0 06/29/2017 1224    No results found for: POCLITH, LITHIUM   No results found for: PHENYTOIN, PHENOBARB, VALPROATE, CBMZ   .res Assessment: Plan:    Plan:  PDMP reviewed  Current medications:  1. Lorazepam 1mg  TID - will send in a 2 week supply  with a refill. 2. Seroquel 25 mg - 2 at bedtime - taking one at bedtime.  RTC 4 weeks  Discussed restarting stimulant - will not add stimulant in at this time. Discussed recent psychosis and hospitalization and possible recurrence of psychosis.   Discussed appointment with a Life coach  Seeing therapist - .  Patient advised to contact office with any questions, adverse effects, or acute worsening in signs and symptoms.  Discussed potential benefits, risks, and side effects of stimulants  with patient to include increased heart rate, palpitations, insomnia, increased anxiety, increased irritability, or decreased appetite.  Instructed patient to contact office if experiencing any significant tolerability issues.  Discussed potential metabolic side effects associated with atypical antipsychotics, as well as potential risk for movement side effects. Advised pt to contact office if movement side effects occur.   Discussed potential benefits, risk, and side effects of benzodiazepines to include potential risk of tolerance and dependence, as well as possible drowsiness.  Advised patient not to drive if experiencing drowsiness and to take lowest possible effective dose to minimize risk of dependence and tolerance.     Diagnoses and all orders for this visit:  Major depressive disorder, recurrent episode, moderate (HCC) -     QUEtiapine (SEROQUEL) 25 MG tablet; Take 1 tablet (25 mg total) by mouth 2 (two) times daily.  Generalized anxiety disorder -     LORazepam (ATIVAN) 1 MG tablet; Take one tablet three times daily as needed for anxiety.     Please see After Visit Summary for patient specific instructions.  Future Appointments  Date Time Provider Department Center  03/05/2021  4:15 PM Arnette Felts, FNP [facility] None  03/06/2021  8:00 AM Waldron Session, [facility] None  03/11/2021  8:00 AM Nivaan Dicenzo, Thereasa Solo, NP [facility] None  03/13/2021 10:00 AM Waldron Session, [facility] None  03/21/2021 11:00 AM Waldron Session, [facility] None  04/05/2021 10:00 AM Waldron Session, [facility] None    No orders of the defined types were placed in this encounter.   -------------------------------

## 2021-03-05 ENCOUNTER — Ambulatory Visit: Payer: BC Managed Care – PPO | Admitting: Nurse Practitioner

## 2021-03-06 ENCOUNTER — Ambulatory Visit: Payer: BC Managed Care – PPO | Admitting: Nurse Practitioner

## 2021-03-06 ENCOUNTER — Telehealth (INDEPENDENT_AMBULATORY_CARE_PROVIDER_SITE_OTHER): Payer: BC Managed Care – PPO | Admitting: Mental Health

## 2021-03-06 DIAGNOSIS — F331 Major depressive disorder, recurrent, moderate: Secondary | ICD-10-CM
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
## 2021-03-11 ENCOUNTER — Ambulatory Visit: Payer: BC Managed Care – PPO | Admitting: Adult Health

## 2021-03-13 ENCOUNTER — Ambulatory Visit: Payer: BC Managed Care – PPO | Admitting: Nurse Practitioner

## 2021-03-13 ENCOUNTER — Ambulatory Visit: Payer: BC Managed Care – PPO | Admitting: Mental Health

## 2021-03-14 ENCOUNTER — Ambulatory Visit: Payer: BC Managed Care – PPO | Admitting: Adult Health

## 2021-03-21 ENCOUNTER — Ambulatory Visit (INDEPENDENT_AMBULATORY_CARE_PROVIDER_SITE_OTHER): Payer: BC Managed Care – PPO | Admitting: Mental Health

## 2021-03-21 DIAGNOSIS — F331 Major depressive disorder, recurrent, moderate: Secondary | ICD-10-CM
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
## 2021-03-26 ENCOUNTER — Ambulatory Visit (INDEPENDENT_AMBULATORY_CARE_PROVIDER_SITE_OTHER): Payer: BC Managed Care – PPO | Admitting: Nurse Practitioner

## 2021-03-26 ENCOUNTER — Ambulatory Visit: Payer: BC Managed Care – PPO | Admitting: Nurse Practitioner

## 2021-03-26 ENCOUNTER — Encounter: Payer: Self-pay | Admitting: Nurse Practitioner

## 2021-03-26 ENCOUNTER — Other Ambulatory Visit: Payer: Self-pay

## 2021-03-26 VITALS — BP 124/80 | HR 95 | Temp 98.4°F | Ht 64.8 in | Wt 240.4 lb

## 2021-03-26 DIAGNOSIS — R635 Abnormal weight gain: Secondary | ICD-10-CM | POA: Diagnosis not present

## 2021-03-26 DIAGNOSIS — L03115 Cellulitis of right lower limb: Secondary | ICD-10-CM | POA: Diagnosis not present

## 2021-03-26 DIAGNOSIS — R5383 Other fatigue: Secondary | ICD-10-CM

## 2021-03-26 DIAGNOSIS — J9801 Acute bronchospasm: Secondary | ICD-10-CM | POA: Diagnosis not present

## 2021-03-26 DIAGNOSIS — R0981 Nasal congestion: Secondary | ICD-10-CM

## 2021-03-26 MED ORDER — DOXYCYCLINE HYCLATE 100 MG PO TABS
100.0000 mg | ORAL_TABLET | Freq: Two times a day (BID) | ORAL | 0 refills | Status: DC
Start: 2021-03-26 — End: 2022-05-01

## 2021-03-26 MED ORDER — FLUTICASONE PROPIONATE 50 MCG/ACT NA SUSP: 1.0000 | Freq: Two times a day (BID) | NASAL | 2 refills | Status: DC

## 2021-03-26 MED ORDER — MUPIROCIN 2 % EX OINT: 1.0000 "application " | TOPICAL_OINTMENT | Freq: Two times a day (BID) | CUTANEOUS | 0 refills | Status: DC

## 2021-03-26 MED ORDER — ALBUTEROL SULFATE HFA 108 (90 BASE) MCG/ACT IN AERS
1.0000 | INHALATION_SPRAY | Freq: Four times a day (QID) | RESPIRATORY_TRACT | 3 refills | Status: DC | PRN
Start: 2021-03-26 — End: 2021-07-12

## 2021-03-26 NOTE — Progress Notes (Signed)
I,Yamilka Roman [employer] as a Neurosurgeon for [employer], FNP.,have documented all relevant documentation on the behalf of Arnette Felts, FNP,as directed by  Arnette Felts, FNP while in the presence of Arnette Felts, FNP.  This visit occurred during the SARS-CoV-2 public health emergency.  Safety protocols were in place, including screening questions prior to the visit, additional usage of staff PPE, and extensive cleaning of exam room while observing appropriate contact time as indicated for disinfecting solutions.  Subjective:     Patient ID: [name] [name] , female    DOB: [birth year] , 30 y.o.   MRN: [id number]   Chief Complaint  Patient presents with   Abscess    Patient stated she has an abscess on both her thighs    thyroid concerns    Would like to have labs done on her thyroid    Sinus Problem    Patient stated she is feeling some facial pressure and pressure in her ears and her ear is swollen.    HPI  She would like her labs checked with [facility].  She is interested in going to [facility].    Abscess This is a new problem. The current episode started yesterday. The problem occurs 2 to 4 times per day. Pertinent negatives include no abdominal pain. Nothing aggravates the symptoms. She has tried nothing for the symptoms.    Past Medical History:  Diagnosis Date   ADHD (attention deficit hyperactivity disorder)    Anxiety    Panic attacks    Serotonin syndrome    Vaginal Pap smear, abnormal      Family History  Problem Relation Age of Onset   Hypertension Mother    Heart disease Father    Cancer Father        testicular   Hypertension Father    Hyperlipidemia Father    Heart disease Paternal Grandfather      Current Outpatient Medications:    doxycycline (VIBRA-TABS) 100 MG tablet, Take 1 tablet (100 mg total) by mouth 2 (two) times daily., Disp: 20 tablet, Rfl: 0   mupirocin ointment (BACTROBAN) 2 %, Apply 1 application topically 2 (two) times  daily., Disp: 22 g, Rfl: 0   QUEtiapine (SEROQUEL) 25 MG tablet, Take 1 tablet (25 mg total) by mouth 2 (two) times daily., Disp: 60 tablet, Rfl: 2   albuterol (VENTOLIN HFA) 108 (90 Base) MCG/ACT inhaler, Inhale 1-2 puffs into the lungs every 6 (six) hours as needed for wheezing or shortness of breath., Disp: 18 g, Rfl: 3   fluticasone (FLONASE) 50 MCG/ACT nasal spray, Place 1 spray into both nostrils 2 (two) times daily., Disp: 16 g, Rfl: 2   LORazepam (ATIVAN) 1 MG tablet, Take one tablet three times daily as needed for anxiety., Disp: 90 tablet, Rfl: 0   metFORMIN (GLUCOPHAGE) 500 MG tablet, Take 1 tablet (500 mg total) by mouth 2 (two) times daily with a meal., Disp: 180 tablet, Rfl: 1   Allergies  Allergen Reactions   Ceclor [Cefaclor] Hives    Age 45 mild rash Tolerates ceftriaxone   Latex Other (See Comments)    Irritation on skin   Zofran [Ondansetron]     Per mother. Patient has serotonin syndrome and can not have Zofran.      Review of Systems  Constitutional: Negative.   Respiratory: Negative.  Negative for wheezing.   Cardiovascular: Negative.   Gastrointestinal:  Negative for abdominal pain.  Skin:  Positive for wound (right posterior thigh with redness and  pain - started yesterday. she reports a history of MRSA in the past).  Neurological:  Negative for dizziness.  Psychiatric/Behavioral:  Negative for agitation and confusion.     Today's Vitals   03/26/21 1138  BP: 124/80  Pulse: 95  Temp: 98.4 F (36.9 C)  SpO2: 96%  Weight: 240 lb 6.4 oz (109 kg)  Height: 5' 4.8" (1.646 m)   Body mass index is 40.25 kg/m.   Objective:  Physical Exam Vitals reviewed.  Constitutional:      General: She is not in acute distress.    Appearance: Normal appearance.  Cardiovascular:     Rate and Rhythm: Normal rate and regular rhythm.     Pulses: Normal pulses.     Heart sounds: Normal heart sounds. No murmur heard. Pulmonary:     Effort: Pulmonary effort is normal. No  respiratory distress.     Breath sounds: Normal breath sounds. No wheezing.  Skin:    General: Skin is warm and dry.  Neurological:     General: No focal deficit present.     Mental Status: She is alert and oriented to person, place, and time.     Cranial Nerves: No cranial nerve deficit.     Motor: No weakness.  Psychiatric:        Mood and Affect: Mood normal.        Behavior: Behavior normal.        Thought Content: Thought content normal.        Judgment: Judgment normal.        Assessment And Plan:     1. Cellulitis of right lower extremity She has erythema present to right posterior thigh Due to history of MRSA will treat with mupirocin and doxy - mupirocin ointment (BACTROBAN) 2 %; Apply 1 application topically 2 (two) times daily.  Dispense: 22 g; Refill: 0 - doxycycline (VIBRA-TABS) 100 MG tablet; Take 1 tablet (100 mg total) by mouth 2 (two) times daily.  Dispense: 20 tablet; Refill: 0  2. Abnormal weight gain Will check for metabolic causes - TSH - Insulin, random  3. Fatigue, unspecified type Will check iron studies - Iron, TIBC and Ferritin Panel  4. Bronchospasm Will treat with albuterol inhalers - albuterol (VENTOLIN HFA) 108 (90 Base) MCG/ACT inhaler; Inhale 1-2 puffs into the lungs every 6 (six) hours as needed for wheezing or shortness of breath.  Dispense: 18 g; Refill: 3  5. Nasal congestion She is to use flonase nasal spray  - fluticasone (FLONASE) 50 MCG/ACT nasal spray; Place 1 spray into both nostrils 2 (two) times daily.  Dispense: 16 g; Refill: 2    Patient was given opportunity to ask questions. Patient verbalized understanding of the plan and was able to repeat key elements of the plan. All questions were answered to their satisfaction.  Arnette Felts, FNP   I, Arnette Felts, FNP, have reviewed all documentation for this visit. The documentation on 03/26/21 for the exam, diagnosis, procedures, and orders are all accurate and complete.   IF YOU  HAVE BEEN REFERRED TO A SPECIALIST, IT MAY TAKE 1-2 WEEKS TO SCHEDULE/PROCESS THE REFERRAL. IF YOU HAVE NOT HEARD FROM US/SPECIALIST IN TWO WEEKS, PLEASE GIVE [place] A CALL AT [phone].   THE PATIENT IS ENCOURAGED TO PRACTICE SOCIAL DISTANCING DUE TO THE COVID-19 PANDEMIC.

## 2021-03-27 LAB — IRON,TIBC AND FERRITIN PANEL
Ferritin: 63 ng/mL (ref 15–150)
Iron Saturation: 9 % — CL (ref 15–55)
Iron: 45 ug/dL (ref 27–159)
Total Iron Binding Capacity: 501 ug/dL — ABNORMAL HIGH (ref 250–450)
UIBC: 456 ug/dL — ABNORMAL HIGH (ref 131–425)

## 2021-03-27 LAB — INSULIN, RANDOM: INSULIN: 30.4 u[IU]/mL — ABNORMAL HIGH (ref 2.6–24.9)

## 2021-03-27 LAB — TSH: TSH: 1.71 u[IU]/mL (ref 0.450–4.500)

## 2021-03-29 ENCOUNTER — Telehealth: Payer: Self-pay | Admitting: Adult Health

## 2021-03-29 ENCOUNTER — Other Ambulatory Visit: Payer: Self-pay

## 2021-03-29 MED ORDER — METFORMIN HCL 500 MG PO TABS: 500.0000 mg | ORAL_TABLET | Freq: Two times a day (BID) | ORAL | 1 refills | Status: DC

## 2021-03-29 NOTE — Telephone Encounter (Signed)
Next visit is 04/09/21. Requesting refill on Ativan called to:  [id number] [place], [place] - [address] AT [facility] OF [address]  Phone:  [phone]  Fax:  [phone]

## 2021-04-01 ENCOUNTER — Other Ambulatory Visit: Payer: Self-pay

## 2021-04-01 DIAGNOSIS — F411 Generalized anxiety disorder: Secondary | ICD-10-CM

## 2021-04-01 MED ORDER — LORAZEPAM 1 MG PO TABS: ORAL_TABLET | ORAL | 0 refills | Status: DC

## 2021-04-01 NOTE — Telephone Encounter (Signed)
Pended.

## 2021-04-02 ENCOUNTER — Encounter: Payer: Self-pay | Admitting: Nurse Practitioner

## 2021-04-02 ENCOUNTER — Ambulatory Visit: Payer: BC Managed Care – PPO | Admitting: Nurse Practitioner

## 2021-04-03 ENCOUNTER — Other Ambulatory Visit: Payer: Self-pay | Admitting: Nurse Practitioner

## 2021-04-03 DIAGNOSIS — R5383 Other fatigue: Secondary | ICD-10-CM

## 2021-04-03 DIAGNOSIS — Z6841 Body Mass Index (BMI) 40.0 and over, adult: Secondary | ICD-10-CM

## 2021-04-03 DIAGNOSIS — E66813 Obesity, class 3: Secondary | ICD-10-CM

## 2021-04-03 DIAGNOSIS — D508 Other iron deficiency anemias: Secondary | ICD-10-CM

## 2021-04-04 ENCOUNTER — Telehealth: Payer: Self-pay | Admitting: Oncology

## 2021-04-04 ENCOUNTER — Encounter: Payer: Self-pay | Admitting: Nurse Practitioner

## 2021-04-04 ENCOUNTER — Ambulatory Visit (INDEPENDENT_AMBULATORY_CARE_PROVIDER_SITE_OTHER): Payer: BC Managed Care – PPO | Admitting: Nurse Practitioner

## 2021-04-04 ENCOUNTER — Other Ambulatory Visit: Payer: Self-pay

## 2021-04-04 VITALS — BP 122/84 | HR 94 | Temp 98.0°F | Ht 64.8 in | Wt 247.8 lb

## 2021-04-04 DIAGNOSIS — R609 Edema, unspecified: Secondary | ICD-10-CM | POA: Diagnosis not present

## 2021-04-04 DIAGNOSIS — R631 Polydipsia: Secondary | ICD-10-CM | POA: Diagnosis not present

## 2021-04-04 DIAGNOSIS — R0602 Shortness of breath: Secondary | ICD-10-CM | POA: Diagnosis not present

## 2021-04-04 MED ORDER — HYDROCHLOROTHIAZIDE 12.5 MG PO TABS: 12.5000 mg | ORAL_TABLET | Freq: Every day | ORAL | 2 refills | Status: DC | PRN

## 2021-04-04 MED ORDER — HYDROCHLOROTHIAZIDE 12.5 MG PO TABS: 12.5000 mg | ORAL_TABLET | Freq: Every day | ORAL | 2 refills | Status: DC

## 2021-04-04 NOTE — Progress Notes (Signed)
I,Yamilka Roman [employer] as a Education administrator for [employer], FNP.,have documented all relevant documentation on the behalf of [name], FNP,as directed by  [name], FNP while in the presence of [name], [place].  This visit occurred during the SARS-CoV-2 public health emergency.  Safety protocols were in place, including screening questions prior to the visit, additional usage of staff PPE, and extensive cleaning of exam room while observing appropriate contact time as indicated for disinfecting solutions.  Subjective:     Patient ID: [name] [name] , female    DOB: 01/08/1991 , 30 y.o.   MRN: [id number]   Chief Complaint  Patient presents with   Edema     HPI  Patient presents today for some swelling in her legs. Seems like her legs are becoming more swelling. After taking a nap while having her feet sitting up her legs were swelling. She has been trying to eat more healthy. She is now eating [employer] chips. She is feeling more thirsty.   Wt Readings from Last 3 Encounters: 04/04/21 : 247 lb 12.8 oz (112.4 kg) 03/26/21 : 240 lb 6.4 oz (109 kg) 09/13/20 : 209 lb 12.8 oz (95.2 kg)      Past Medical History:  Diagnosis Date   ADHD (attention deficit hyperactivity disorder)    Anxiety    Panic attacks    Serotonin syndrome    Vaginal Pap smear, abnormal      Family History  Problem Relation Age of Onset   Hypertension Mother    Heart disease Father    Cancer Father        testicular   Hypertension Father    Hyperlipidemia Father    Heart disease Paternal Grandfather      Current Outpatient Medications:    albuterol (VENTOLIN HFA) 108 (90 Base) MCG/ACT inhaler, Inhale 1-2 puffs into the lungs every 6 (six) hours as needed for wheezing or shortness of breath., Disp: 18 g, Rfl: 3   doxycycline (VIBRA-TABS) 100 MG tablet, Take 1 tablet (100 mg total) by mouth 2 (two) times daily., Disp: 20 tablet, Rfl: 0   fluticasone (FLONASE) 50 MCG/ACT nasal spray, Place 1 spray  into both nostrils 2 (two) times daily., Disp: 16 g, Rfl: 2   LORazepam (ATIVAN) 1 MG tablet, Take one tablet three times daily as needed for anxiety., Disp: 90 tablet, Rfl: 0   metFORMIN (GLUCOPHAGE) 500 MG tablet, Take 1 tablet (500 mg total) by mouth 2 (two) times daily with a meal., Disp: 180 tablet, Rfl: 1   mupirocin ointment (BACTROBAN) 2 %, Apply 1 application topically 2 (two) times daily., Disp: 22 g, Rfl: 0   QUEtiapine (SEROQUEL) 25 MG tablet, Take 1 tablet (25 mg total) by mouth 2 (two) times daily., Disp: 60 tablet, Rfl: 2   hydrochlorothiazide (HYDRODIURIL) 12.5 MG tablet, Take 1 tablet (12.5 mg total) by mouth daily as needed., Disp: 30 tablet, Rfl: 2   hydrOXYzine (ATARAX/VISTARIL) 10 MG tablet, Take 1 tablet (10 mg total) by mouth 3 (three) times daily as needed., Disp: 90 tablet, Rfl: 0   Allergies  Allergen Reactions   Ceclor [Cefaclor] Hives    Age 2 mild rash Tolerates ceftriaxone   Latex Other (See Comments)    Irritation on skin   Zofran [Ondansetron]     Per mother. Patient has serotonin syndrome and can not have Zofran.      Review of Systems  Constitutional: Negative.   Respiratory:  Positive for shortness of breath. Negative for wheezing.  Cardiovascular: Negative.  Negative for chest pain, palpitations and leg swelling.  Neurological:  Negative for dizziness and headaches.  Psychiatric/Behavioral: Negative.      Today's Vitals   04/04/21 1124  BP: 122/84  Pulse: 94  Temp: 98 F (36.7 C)  Weight: 247 lb 12.8 oz (112.4 kg)  Height: 5' 4.8" (1.646 m)   Body mass index is 41.49 kg/m.   Objective:  Physical Exam Constitutional:      General: She is not in acute distress.    Appearance: Normal appearance.  Musculoskeletal:     Right lower leg: Edema (1+) present.     Left lower leg: Edema (1+) present.  Neurological:     Mental Status: She is alert.  Psychiatric:        Mood and Affect: Mood normal.        Behavior: Behavior normal.         Thought Content: Thought content normal.        Judgment: Judgment normal.        Assessment And Plan:     1. Edema, unspecified type Comments: Encouraged to elevate lower extremities Will check for fluid overload EKG done with NSR HR 89 - Brain natriuretic peptide - VAS [place] LOWER EXTREMITY VENOUS REFLUX; Future - CMP14+EGFR - EKG 12-Lead  2. Polydipsia Comments: Will check HgbA1c, she is also awaiting appt with [facility] - Hemoglobin A1c  3. Shortness of breath Comments: Will treat with HCTZ as needed No abnormal lung sounds - EKG 12-Lead - hydrochlorothiazide (HYDRODIURIL) 12.5 MG tablet; Take 1 tablet (12.5 mg total) by mouth daily as needed.  Dispense: 30 tablet; Refill: 2    Patient was given opportunity to ask questions. Patient verbalized understanding of the plan and was able to repeat key elements of the plan. All questions were answered to their satisfaction.  [name], FNP   I, [name], FNP, have reviewed all documentation for this visit. The documentation on 04/25/21 for the exam, diagnosis, procedures, and orders are all accurate and complete.   IF YOU HAVE BEEN REFERRED TO A SPECIALIST, IT MAY TAKE 1-2 WEEKS TO SCHEDULE/PROCESS THE REFERRAL. IF YOU HAVE NOT HEARD FROM US/SPECIALIST IN TWO WEEKS, PLEASE GIVE [place] A CALL AT [phone].   THE PATIENT IS ENCOURAGED TO PRACTICE SOCIAL DISTANCING DUE TO THE COVID-19 PANDEMIC.

## 2021-04-04 NOTE — Patient Instructions (Addendum)
Compression socks during day

## 2021-04-04 NOTE — Telephone Encounter (Signed)
Scheduled appt per referral. Pt aware.  

## 2021-04-05 ENCOUNTER — Telehealth (INDEPENDENT_AMBULATORY_CARE_PROVIDER_SITE_OTHER): Payer: BC Managed Care – PPO | Admitting: Mental Health

## 2021-04-05 DIAGNOSIS — F331 Major depressive disorder, recurrent, moderate: Secondary | ICD-10-CM

## 2021-04-05 DIAGNOSIS — F411 Generalized anxiety disorder: Secondary | ICD-10-CM

## 2021-04-05 LAB — HEMOGLOBIN A1C
Est. average glucose Bld gHb Est-mCnc: 108 mg/dL
Hgb A1c MFr Bld: 5.4 % (ref 4.8–5.6)

## 2021-04-05 LAB — CMP14+EGFR
ALT: 21 IU/L (ref 0–32)
AST: 16 IU/L (ref 0–40)
Albumin/Globulin Ratio: 1.6 (ref 1.2–2.2)
Albumin: 4.4 g/dL (ref 3.8–4.8)
Alkaline Phosphatase: 119 IU/L (ref 44–121)
BUN/Creatinine Ratio: 21 (ref 9–23)
BUN: 18 mg/dL (ref 6–20)
Bilirubin Total: 0.3 mg/dL (ref 0.0–1.2)
CO2: 27 mmol/L (ref 20–29)
Calcium: 10 mg/dL (ref 8.7–10.2)
Chloride: 103 mmol/L (ref 96–106)
Creatinine, Ser: 0.86 mg/dL (ref 0.57–1.00)
Globulin, Total: 2.8 g/dL (ref 1.5–4.5)
Glucose: 101 mg/dL — ABNORMAL HIGH (ref 65–99)
Potassium: 4.4 mmol/L (ref 3.5–5.2)
Sodium: 143 mmol/L (ref 134–144)
Total Protein: 7.2 g/dL (ref 6.0–8.5)
eGFR: 93 mL/min/{1.73_m2} (ref >59)

## 2021-04-05 LAB — BRAIN NATRIURETIC PEPTIDE: BNP: 6 pg/mL (ref 0.0–100.0)

## 2021-04-05 NOTE — Progress Notes (Signed)
[facility]otherapy Note  Name: [name] [name] Date: 04/05/2021 MRN: [id number] DOB: [birth year] PCP: Arnette Felts, FNP  Time spent: 30 minutes  Treatment:  Individual therapy  Virtual Visit via Telephone Note Connected with patient by a telephone enabled telemedicine/telehealth application with their informed consent, and verified patient privacy and that I am speaking with the correct person using two identifiers. I discussed the limitations, risks, security and privacy concerns of performing psychotherapy and management service by telephone and the availability of in person appointments. I also discussed with the patient that there may be a patient responsible charge related to this service. The patient expressed understanding and agreed to proceed. I discussed the treatment planning with the patient. The patient was provided an opportunity to ask questions and all were answered. The patient agreed with the plan and demonstrated an understanding of the instructions. The patient was advised to call  our office if  symptoms worsen or feel they are in a crisis state and need immediate contact.   Therapist Location: office Patient Location: home    Mental Status Exam:    Appearance:    Casual     Behavior:   Appropriate  Motor:   WNL  Speech/Language:    Clear and Coherent  Affect:   Full range  Mood:   Depressed, anxious  Thought process:   normal  Thought content:     WNL  Sensory/Perceptual disturbances:     none  Orientation:   x4  Attention:   Good  Concentration:   Good  Memory:   Intact  Fund of knowledge:    Consistent with age and development  Insight:     developing  Judgment:    Good  Impulse Control:   Good    Reported Symptoms:  Appetite disturbance, sleep disturbance, anxiety (8/10), depression (8/10), low motivation, isolative  Risk Assessment: Danger to Self:  No Self-injurious Behavior: No Danger to Others: No Duty to Warn:no Physical Aggression /  Violence:No  Access to Firearms a concern: No  Gang Involvement:No  Patient / guardian was educated about steps to take if suicide or homicide risk level increases between visits:   While future psychiatric events cannot be accurately predicted, the patient does not currently require acute inpatient psychiatric care and does not currently meet [facility] involuntary commitment criteria.    Medical History/Surgical History:  Past Medical History:  Diagnosis Date   ADHD (attention deficit hyperactivity disorder)    Anxiety    Panic attacks    Serotonin syndrome    Vaginal Pap smear, abnormal     Medications: Current Outpatient Medications  Medication Sig Dispense Refill   albuterol (VENTOLIN HFA) 108 (90 Base) MCG/ACT inhaler Inhale 1-2 puffs into the lungs every 6 (six) hours as needed for wheezing or shortness of breath. 18 g 3   doxycycline (VIBRA-TABS) 100 MG tablet Take 1 tablet (100 mg total) by mouth 2 (two) times daily. 20 tablet 0   fluticasone (FLONASE) 50 MCG/ACT nasal spray Place 1 spray into both nostrils 2 (two) times daily. 16 g 2   hydrochlorothiazide (HYDRODIURIL) 12.5 MG tablet Take 1 tablet (12.5 mg total) by mouth daily as needed. 30 tablet 2   LORazepam (ATIVAN) 1 MG tablet Take one tablet three times daily as needed for anxiety. 90 tablet 0   metFORMIN (GLUCOPHAGE) 500 MG tablet Take 1 tablet (500 mg total) by mouth 2 (two) times daily with a meal. 180 tablet 1   mupirocin ointment (BACTROBAN) 2 %  Apply 1 application topically 2 (two) times daily. 22 g 0   QUEtiapine (SEROQUEL) 25 MG tablet Take 1 tablet (25 mg total) by mouth 2 (two) times daily. 60 tablet 2   No current facility-administered medications for this visit.    Subjective:  Patient engaged in telehealth session via telephone. She stated she went to discuss losing ways to lose weight, going to an internal medicine doctor. After tests, she stated her low thyroid and iron levels.  She has tried to  lose weight by changing her diet but she continued to gain weight. Her doctor has put her on water pills. She is having swelling in her legs but they are uncertain reasons for the swelling at this point. She identified wanting to feel able to function better, feel fatigued less as opposed to losing weight for vanity reasons. She is to have an ultrasound and a transfusion to further address her low iron levels. She admitted wanting a "quick fix" related to losing weight but realizes it is a process.   Interventions: Further assessment, CBT, supportive therapy, solution focused  Diagnoses:    ICD-10-CM   1. Major depressive disorder, recurrent episode, moderate (HCC)  F33.1     2. Generalized anxiety disorder  F41.1         Plan: Patient is to f/u w/ doctor appts. Remind herself that she can improve her health but that it will be a process and not immediate. Pt to use her supports (mother, husband).   Long-term goal:   Reduce overall level, frequency, and intensity of the feelings of depression and anxiety 8-9/10 to a 0-2/10 in severity for at least 3 consecutive months per patient report   Short-term goal:  Increase ability to relax by identifying activities Increase functioning daily evidenced by such tasks as making lunch for her kids, cleaning when needed, achieving at least 3 tasks per/day Improve sleep by going to bed early, waking at night from about 1am-4am, then goes back to bed for a few hours.  Improve coping skills as identified in session to decrease panic attacks, depression symptoms. Comply with court requirements to decrease stress and anxiety     Assessment of progress:  progressing   Waldron Session, [facility]

## 2021-04-08 ENCOUNTER — Ambulatory Visit (HOSPITAL_COMMUNITY)
Admission: RE | Admit: 2021-04-08 | Discharge: 2021-04-08 | Disposition: A | Discharge status: Home / Self Care | Payer: BC Managed Care – PPO | Source: Ambulatory Visit | Attending: Nurse Practitioner | Admitting: Nurse Practitioner

## 2021-04-08 ENCOUNTER — Other Ambulatory Visit: Payer: Self-pay

## 2021-04-08 DIAGNOSIS — R609 Edema, unspecified: Secondary | ICD-10-CM | POA: Diagnosis not present

## 2021-04-09 ENCOUNTER — Telehealth (INDEPENDENT_AMBULATORY_CARE_PROVIDER_SITE_OTHER): Payer: BC Managed Care – PPO | Admitting: Adult Health

## 2021-04-09 ENCOUNTER — Encounter: Payer: Self-pay | Admitting: Adult Health

## 2021-04-09 ENCOUNTER — Inpatient Hospital Stay: Payer: BC Managed Care – PPO | Attending: Oncology | Admitting: Oncology

## 2021-04-09 VITALS — BP 131/84 | HR 82 | Temp 98.5°F | Resp 17 | Ht 64.8 in | Wt 245.0 lb

## 2021-04-09 DIAGNOSIS — F428 Other obsessive-compulsive disorder: Secondary | ICD-10-CM

## 2021-04-09 DIAGNOSIS — E611 Iron deficiency: Secondary | ICD-10-CM

## 2021-04-09 DIAGNOSIS — Z79899 Other long term (current) drug therapy: Secondary | ICD-10-CM

## 2021-04-09 DIAGNOSIS — Z87891 Personal history of nicotine dependence: Secondary | ICD-10-CM | POA: Diagnosis not present

## 2021-04-09 DIAGNOSIS — Z8042 Family history of malignant neoplasm of prostate: Secondary | ICD-10-CM | POA: Diagnosis not present

## 2021-04-09 DIAGNOSIS — F331 Major depressive disorder, recurrent, moderate: Secondary | ICD-10-CM | POA: Diagnosis not present

## 2021-04-09 DIAGNOSIS — R5383 Other fatigue: Secondary | ICD-10-CM

## 2021-04-09 DIAGNOSIS — Z8349 Family history of other endocrine, nutritional and metabolic diseases: Secondary | ICD-10-CM

## 2021-04-09 DIAGNOSIS — F411 Generalized anxiety disorder: Secondary | ICD-10-CM | POA: Diagnosis not present

## 2021-04-09 DIAGNOSIS — Z881 Allergy status to other antibiotic agents status: Secondary | ICD-10-CM

## 2021-04-09 DIAGNOSIS — Z888 Allergy status to other drugs, medicaments and biological substances status: Secondary | ICD-10-CM

## 2021-04-09 DIAGNOSIS — D509 Iron deficiency anemia, unspecified: Secondary | ICD-10-CM

## 2021-04-09 DIAGNOSIS — Z8249 Family history of ischemic heart disease and other diseases of the circulatory system: Secondary | ICD-10-CM | POA: Diagnosis not present

## 2021-04-09 DIAGNOSIS — F909 Attention-deficit hyperactivity disorder, unspecified type: Secondary | ICD-10-CM

## 2021-04-09 NOTE — Progress Notes (Signed)
[name] [name] [id number] Mar 18, 1991 30 y.o.  Virtual Visit via Video Note  I connected with pt @ on 04/09/21 at  9:40 AM EDT by a video enabled telemedicine application and verified that I am speaking with the correct person using two identifiers.   I discussed the limitations of evaluation and management by telemedicine and the availability of in person appointments. The patient expressed understanding and agreed to proceed.  I discussed the assessment and treatment plan with the patient. The patient was provided an opportunity to ask questions and all were answered. The patient agreed with the plan and demonstrated an understanding of the instructions.   The patient was advised to call back or seek an in-person evaluation if the symptoms worsen or if the condition fails to improve as anticipated.  I provided 30 minutes of non-face-to-face time during this encounter.  The patient was located at home.  The provider was located at [facility].   Dorothyann Gibbs, NP   Subjective:   Patient ID:  [name] [name] is a 30 y.o. (DOB [birth year]) female.  Chief Complaint: No chief complaint on file.   HPI [name] [name] presents for follow-up of MDD, GAD, Obsessional thoughts, and ADHD.  Describes mood today as "ok". Pleasant. Tearful at times. Mood symptoms - denies depression, anxiety and irritability. Denies panic attacks. Stating "I'm doing alright". Working with PCP for abnormal labs. Still struggling with low energy levels - having a blood transfusion today. Iron levels low - taking iron supplementation. Feels nervous about recent health concerns, but is hopeful. Seeing Elio Forget - therapist. Improved interest and motivation.  Energy levels lower. Active, does not have a regular exercise routine. Enjoys some usual interests and activities. Married, but separated. Lives with 2 daughters. Family local and supportive.  Appetite adequate. Weight stable - 200+ pounds.  Sleeps  well most nights. Averges 10 or more hours.  Focus and concentration difficulties. Completing tasks. Managing some aspects of household.  Denies SI or HI.  Denies AH or VH.  Previous medication trials: Adderall, Clonazepam, Lorazepam, Trazadone, Lexapro, Paxil, Zoloft.   Review of Systems:  Review of Systems  Musculoskeletal:  Negative for gait problem.  Neurological:  Negative for tremors.  Psychiatric/Behavioral:         Please refer to HPI   Medications: I have reviewed the patient's current medications.  Current Outpatient Medications  Medication Sig Dispense Refill   albuterol (VENTOLIN HFA) 108 (90 Base) MCG/ACT inhaler Inhale 1-2 puffs into the lungs every 6 (six) hours as needed for wheezing or shortness of breath. 18 g 3   doxycycline (VIBRA-TABS) 100 MG tablet Take 1 tablet (100 mg total) by mouth 2 (two) times daily. 20 tablet 0   fluticasone (FLONASE) 50 MCG/ACT nasal spray Place 1 spray into both nostrils 2 (two) times daily. 16 g 2   hydrochlorothiazide (HYDRODIURIL) 12.5 MG tablet Take 1 tablet (12.5 mg total) by mouth daily as needed. 30 tablet 2   LORazepam (ATIVAN) 1 MG tablet Take one tablet three times daily as needed for anxiety. 90 tablet 0   metFORMIN (GLUCOPHAGE) 500 MG tablet Take 1 tablet (500 mg total) by mouth 2 (two) times daily with a meal. 180 tablet 1   mupirocin ointment (BACTROBAN) 2 % Apply 1 application topically 2 (two) times daily. 22 g 0   QUEtiapine (SEROQUEL) 25 MG tablet Take 1 tablet (25 mg total) by mouth 2 (two) times daily. 60 tablet 2   No current facility-administered medications  for this visit.    Medication Side Effects: None  Allergies:  Allergies  Allergen Reactions   Ceclor [Cefaclor] Hives    Age 24 mild rash Tolerates ceftriaxone   Latex Other (See Comments)    Irritation on skin   Zofran [Ondansetron]     Per mother. Patient has serotonin syndrome and can not have Zofran.     Past Medical History:  Diagnosis Date    ADHD (attention deficit hyperactivity disorder)    Anxiety    Panic attacks    Serotonin syndrome    Vaginal Pap smear, abnormal     Family History  Problem Relation Age of Onset   Hypertension Mother    Heart disease Father    Cancer Father        testicular   Hypertension Father    Hyperlipidemia Father    Heart disease Paternal Grandfather     Social History   Socioeconomic History   Marital status: Married    Spouse name: Not on file   Number of children: Not on file   Years of education: Not on file   Highest education level: Not on file  Occupational History   Not on file  Tobacco Use   Smoking status: Former    Types: Cigarettes    Quit date: 08/15/2013    Years since quitting: 7.6   Smokeless tobacco: Never  Substance and Sexual Activity   Alcohol use: No    Alcohol/week: 2.0 standard drinks    Types: 2 Standard drinks or equivalent per week    Comment: socially, not with preg   Drug use: No   Sexual activity: Not Currently    Birth control/protection: I.U.D.  Other Topics Concern   Not on file  Social History Narrative   Not on file   Social Determinants of Health   Financial Resource Strain: Not on file  Food Insecurity: Not on file  Transportation Needs: Not on file  Physical Activity: Not on file  Stress: Not on file  Social Connections: Not on file  Intimate Partner Violence: Not on file    Past Medical History, Surgical history, Social history, and Family history were reviewed and updated as appropriate.   Please see review of systems for further details on the patient's review from today.   Objective:   Physical Exam:  There were no vitals taken for this visit.  Physical Exam Constitutional:      General: She is not in acute distress. Musculoskeletal:        General: No deformity.  Neurological:     Mental Status: She is alert and oriented to person, place, and time.     Coordination: Coordination normal.  Psychiatric:         Attention and Perception: Attention and perception normal. She does not perceive auditory or visual hallucinations.        Mood and Affect: Mood normal. Mood is not anxious or depressed. Affect is not labile, blunt, angry or inappropriate.        Speech: Speech normal.        Behavior: Behavior normal.        Thought Content: Thought content normal. Thought content is not paranoid or delusional. Thought content does not include homicidal or suicidal ideation. Thought content does not include homicidal or suicidal plan.        Cognition and Memory: Cognition and memory normal.        Judgment: Judgment normal.     Comments: Insight  intact    Lab Review:     Component Value Date/Time   NA 143 04/04/2021 1236   K 4.4 04/04/2021 1236   CL 103 04/04/2021 1236   CO2 27 04/04/2021 1236   GLUCOSE 101 (H) 04/04/2021 1236   GLUCOSE 140 (H) 08/30/2020 1050   BUN 18 04/04/2021 1236   CREATININE 0.86 04/04/2021 1236   CALCIUM 10.0 04/04/2021 1236   PROT 7.2 04/04/2021 1236   ALBUMIN 4.4 04/04/2021 1236   AST 16 04/04/2021 1236   ALT 21 04/04/2021 1236   ALKPHOS 119 04/04/2021 1236   BILITOT 0.3 04/04/2021 1236   GFRNONAA >60 08/30/2020 1050   GFRAA 133 06/29/2017 1224       Component Value Date/Time   WBC 11.7 (H) 08/30/2020 1050   RBC 4.90 08/30/2020 1050   HGB 14.5 08/30/2020 1050   HGB 13.7 06/29/2017 1224   HGB 12.7 06/06/2015 0000   HCT 43.9 08/30/2020 1050   HCT 41.0 06/29/2017 1224   HCT 37 06/06/2015 0000   PLT 480 (H) 08/30/2020 1050   PLT 312 06/29/2017 1224   PLT 300 06/06/2015 0000   MCV 89.6 08/30/2020 1050   MCV 92.5 02/16/2018 1224   MCV 93 06/29/2017 1224   MCH 29.6 08/30/2020 1050   MCHC 33.0 08/30/2020 1050   RDW 12.4 08/30/2020 1050   RDW 13.3 06/29/2017 1224   LYMPHSABS 1.9 08/30/2020 1050   LYMPHSABS 1.7 06/29/2017 1224   MONOABS 0.6 08/30/2020 1050   EOSABS 0.0 08/30/2020 1050   EOSABS 0.2 06/29/2017 1224   BASOSABS 0.1 08/30/2020 1050   BASOSABS 0.0  06/29/2017 1224    No results found for: POCLITH, LITHIUM   No results found for: PHENYTOIN, PHENOBARB, VALPROATE, CBMZ   .res Assessment: Plan:    Plan:  PDMP reviewed  Current medications:  1. Lorazepam 1mg  TID - will send in a 2 week supply with a refill. 2. Seroquel 25 mg - 2 at bedtime - taking one at bedtime.  RTC 4 weeks  Discussed restarting stimulant - will not add stimulant in at this time. Discussed recent psychosis and hospitalization and possible recurrence of psychosis.   Discussed appointment with a Life coach  Seeing therapist - .  Patient advised to contact office with any questions, adverse effects, or acute worsening in signs and symptoms.  Discussed potential benefits, risks, and side effects of stimulants with patient to include increased heart rate, palpitations, insomnia, increased anxiety, increased irritability, or decreased appetite.  Instructed patient to contact office if experiencing any significant tolerability issues.  Discussed potential metabolic side effects associated with atypical antipsychotics, as well as potential risk for movement side effects. Advised pt to contact office if movement side effects occur.   Discussed potential benefits, risk, and side effects of benzodiazepines to include potential risk of tolerance and dependence, as well as possible drowsiness.  Advised patient not to drive if experiencing drowsiness and to take lowest possible effective dose to minimize risk of dependence and tolerance.  Diagnoses and all orders for this visit:  Major depressive disorder, recurrent episode, moderate (HCC)  Generalized anxiety disorder  Obsessional thoughts  Attention deficit hyperactivity disorder (ADHD), unspecified ADHD type    Please see After Visit Summary for patient specific instructions.  Future Appointments  Date Time Provider Department Center  04/09/2021 11:00 AM 04/11/2021, MD [facility] None   04/29/2021  8:00 AM 05/01/2021, [facility] None  06/04/2021  9:45 AM 06/06/2021, FNP [facility] None  No orders of the defined types were placed in this encounter.     -------------------------------

## 2021-04-09 NOTE — Progress Notes (Signed)
Reason for the request:    Iron deficiency  HPI: I was asked by Arnette Felts, FNP  to evaluate [name] [name] for the evaluation of iron deficiency.  This is a 30 year old woman with history of anxiety and ADHD who was found to have iron deficiency based on laboratory testing obtained on March 26, 2021.  Her iron was 45 with a iron-binding capacity 501 elevated.  Ferritin was 63 with iron saturation of 9%.  Her hemoglobin previously in December 2021 was within normal range.  No previous iron testing has been done and there is no recent CBC obtained.  Clinically, she reports symptoms of fatigue and tiredness.  She has not reported any hematochezia, melena or hemoptysis.  She denies any heavy menstrual bleeding.  She was started on oral iron therapy in the last week and a half which she is taking daily.  She denies any complications related to oral iron at this time.  She denies any dyspepsia or constipation.  She does not report any headaches, blurry vision, syncope or seizures. Does not report any fevers, chills or sweats.  Does not report any cough, wheezing or hemoptysis.  Does not report any chest pain, palpitation, orthopnea or leg edema.  Does not report any nausea, vomiting or abdominal pain.  Does not report any constipation or diarrhea.  Does not report any skeletal complaints.    Does not report frequency, urgency or hematuria.  Does not report any skin rashes or lesions. Does not report any heat or cold intolerance.  Does not report any lymphadenopathy or petechiae.  Does not report any anxiety or depression.  Remaining review of systems is negative.     Past Medical History:  Diagnosis Date   ADHD (attention deficit hyperactivity disorder)    Anxiety    Panic attacks    Serotonin syndrome    Vaginal Pap smear, abnormal   :   Past Surgical History:  Procedure Laterality Date   DILATION AND CURETTAGE OF UTERUS  01/04/16   DILATION AND EVACUATION N/A 01/04/2016   Procedure: DILATATION AND  EVACUATION;  Surgeon: Candice Camp, MD;  Location: [facility];  Service: Gynecology;  Laterality: N/A;   GUM SURGERY  01/2017   INDUCED ABORTION     wisdom teeth removal Bilateral    2010  :   Current Outpatient Medications:    albuterol (VENTOLIN HFA) 108 (90 Base) MCG/ACT inhaler, Inhale 1-2 puffs into the lungs every 6 (six) hours as needed for wheezing or shortness of breath., Disp: 18 g, Rfl: 3   doxycycline (VIBRA-TABS) 100 MG tablet, Take 1 tablet (100 mg total) by mouth 2 (two) times daily., Disp: 20 tablet, Rfl: 0   fluticasone (FLONASE) 50 MCG/ACT nasal spray, Place 1 spray into both nostrils 2 (two) times daily., Disp: 16 g, Rfl: 2   hydrochlorothiazide (HYDRODIURIL) 12.5 MG tablet, Take 1 tablet (12.5 mg total) by mouth daily as needed., Disp: 30 tablet, Rfl: 2   LORazepam (ATIVAN) 1 MG tablet, Take one tablet three times daily as needed for anxiety., Disp: 90 tablet, Rfl: 0   metFORMIN (GLUCOPHAGE) 500 MG tablet, Take 1 tablet (500 mg total) by mouth 2 (two) times daily with a meal., Disp: 180 tablet, Rfl: 1   mupirocin ointment (BACTROBAN) 2 %, Apply 1 application topically 2 (two) times daily., Disp: 22 g, Rfl: 0   QUEtiapine (SEROQUEL) 25 MG tablet, Take 1 tablet (25 mg total) by mouth 2 (two) times daily., Disp: 60 tablet, Rfl: 2:  Allergies  Allergen Reactions   Ceclor [Cefaclor] Hives    Age 46 mild rash Tolerates ceftriaxone   Latex Other (See Comments)    Irritation on skin   Zofran [Ondansetron]     Per mother. Patient has serotonin syndrome and can not have Zofran.   :   Family History  Problem Relation Age of Onset   Hypertension Mother    Heart disease Father    Cancer Father        testicular   Hypertension Father    Hyperlipidemia Father    Heart disease Paternal Grandfather   :   Social History   Socioeconomic History   Marital status: Married    Spouse name: Not on file   Number of children: Not on file   Years of education: Not on file    Highest education level: Not on file  Occupational History   Not on file  Tobacco Use   Smoking status: Former    Types: Cigarettes    Quit date: 08/15/2013    Years since quitting: 7.6   Smokeless tobacco: Never  Substance and Sexual Activity   Alcohol use: No    Alcohol/week: 2.0 standard drinks    Types: 2 Standard drinks or equivalent per week    Comment: socially, not with preg   Drug use: No   Sexual activity: Not Currently    Birth control/protection: I.U.D.  Other Topics Concern   Not on file  Social History Narrative   Not on file   Social Determinants of Health   Financial Resource Strain: Not on file  Food Insecurity: Not on file  Transportation Needs: Not on file  Physical Activity: Not on file  Stress: Not on file  Social Connections: Not on file  Intimate Partner Violence: Not on file  :  Pertinent items are noted in HPI. Blood pressure 131/84, pulse 82, temperature 98.5 F (36.9 C), temperature source Oral, resp. rate 17, height 5' 4.8" (1.646 m), weight 245 lb (111.1 kg), SpO2 98 %.  Exam:  General appearance: alert and cooperative appeared without distress. Head: atraumatic without any abnormalities. Eyes: conjunctivae/corneas clear. PERRL.  Sclera anicteric. Throat: lips, mucosa, and tongue normal; without oral thrush or ulcers. Resp: clear to auscultation bilaterally without rhonchi, wheezes or dullness to percussion. Cardio: regular rate and rhythm, S1, S2 normal, no murmur, click, rub or gallop GI: soft, non-tender; bowel sounds normal; no masses,  no organomegaly Skin: Skin color, texture, turgor normal. No rashes or lesions Lymph nodes: Cervical, supraclavicular, and axillary nodes normal. Neurologic: Grossly normal without any motor, sensory or deep tendon reflexes. Musculoskeletal: No joint deformity or effusion.    VAS [place] LOWER EXTREMITY VENOUS REFLUX  Result Date: 04/08/2021  Lower Venous Reflux Study Patient Name:  [name] [name]  Date  of Exam:   04/08/2021 Medical Rec #: [id number]       Accession #:    [id number] Date of Birth: [birth year]       Patient Gender: F Patient Age:   30Y Exam Location:  [facility] Procedure:      VAS [place] LOWER EXTREMITY VENOUS REFLUX Referring Phys: [id number] Arnette Felts --------------------------------------------------------------------------------  Indications: Swelling, and Edema. Other Indications: The patient's lower extremity edema reduced greatly after                    taking a diuretic. Performing Technologist: Hardie Lora RVT  Examination Guidelines: A complete evaluation includes B-mode imaging, spectral Doppler, color Doppler,  and power Doppler as needed of all accessible portions of each vessel. Bilateral testing is considered an integral part of a complete examination. Limited examinations for reoccurring indications may be performed as noted. The reflux portion of the exam is performed with the patient in reverse Trendelenburg. Significant venous reflux is defined as >500 ms in the superficial venous system, and >1 second in the deep venous system.  Venous Reflux Times +--------------+---------+------+-----------+------------+--------+ RIGHT         Reflux NoRefluxReflux TimeDiameter cmsComments                         Yes                                  +--------------+---------+------+-----------+------------+--------+ CFV           no                                             +--------------+---------+------+-----------+------------+--------+ FV prox       no                                             +--------------+---------+------+-----------+------------+--------+ FV mid        no                                             +--------------+---------+------+-----------+------------+--------+ FV dist       no                                             +--------------+---------+------+-----------+------------+--------+ Popliteal      no                                             +--------------+---------+------+-----------+------------+--------+ GSV at SFJ    no                           0.691             +--------------+---------+------+-----------+------------+--------+ GSV prox thighno                           0.555             +--------------+---------+------+-----------+------------+--------+ GSV mid thigh no                           0.472             +--------------+---------+------+-----------+------------+--------+ GSV dist thighno                           0.465             +--------------+---------+------+-----------+------------+--------+ GSV at knee   no  0.514             +--------------+---------+------+-----------+------------+--------+ GSV prox calf                              0.431             +--------------+---------+------+-----------+------------+--------+ GSV mid calf                               0.445             +--------------+---------+------+-----------+------------+--------+ SSV Pop Fossa no                           0.483             +--------------+---------+------+-----------+------------+--------+ SSV prox calf no                           0.286             +--------------+---------+------+-----------+------------+--------+ SSV mid calf                               0.276             +--------------+---------+------+-----------+------------+--------+  +--------------+---------+------+-----------+------------+--------+ LEFT          Reflux NoRefluxReflux TimeDiameter cmsComments                         Yes                                  +--------------+---------+------+-----------+------------+--------+ CFV           no                                             +--------------+---------+------+-----------+------------+--------+ FV prox       no                                              +--------------+---------+------+-----------+------------+--------+ FV mid        no                                             +--------------+---------+------+-----------+------------+--------+ FV dist       no                                             +--------------+---------+------+-----------+------------+--------+ Popliteal     no                                             +--------------+---------+------+-----------+------------+--------+ GSV at [facility]    no  0.787             +--------------+---------+------+-----------+------------+--------+ GSV prox thighno                           0.609             +--------------+---------+------+-----------+------------+--------+ GSV mid thigh no                           0.369             +--------------+---------+------+-----------+------------+--------+ GSV dist thighno                           0.383             +--------------+---------+------+-----------+------------+--------+ GSV at knee   no                           0.544             +--------------+---------+------+-----------+------------+--------+ GSV prox calf                              0.452             +--------------+---------+------+-----------+------------+--------+ GSV mid calf                               0.343             +--------------+---------+------+-----------+------------+--------+ SSV Pop Fossa no                           0.393             +--------------+---------+------+-----------+------------+--------+ SSV prox calf no                           0.265             +--------------+---------+------+-----------+------------+--------+ SSV mid calf                               0.269             +--------------+---------+------+-----------+------------+--------+   Summary: Bilateral: - No evidence of deep vein thrombosis seen in the lower extremities, bilaterally,  from the common femoral through the popliteal veins. - No evidence of superficial venous thrombosis in the lower extremities, bilaterally. - No evidence of deep venous insufficiency seen bilaterally in the lower extremity. - No evidence of superficial venous reflux seen in the greater saphenous veins bilaterally. - No evidence of superficial venous reflux seen in the short saphenous veins bilaterally.  *See table(s) above for measurements and observations. Electronically signed by Coral ElseVance Brabham MD on 04/08/2021 at 6:52:57 PM.    Final     Assessment and Plan:   30 year old woman with:  1.  Iron deficiency noted by decreased iron saturation on March 26, 2021.  Her iron level is 45 with ferritin of 63.  Her iron binding capacity is elevated at 501 indicating iron deficiency.  Last hemoglobin obtained on August 30, 2020 was 14.5.  Etiology of iron deficiency anemia were discussed.  Chronic menstrual blood losses versus GI blood losses versus poor oral iron absorption  were discussed.  Management options at this time were discussed.  Oral iron replacement versus intravenous iron infusion were discussed at this time.  Given her mild iron deficiency oral iron replacement may be reasonable at this time.  Complications that include nausea, abdominal pain and dyspepsia were reiterated.  After discussion today, I recommended continuing oral iron replacement given the mild iron deficiency and it is unclear that she has absorption issues with oral iron.  The plan is to update her labs and iron studies in the next 2 to 3 months.  Intravenous iron can be used at the time if needed.   2.  Follow-up: We will be in 3 months for repeat follow-up.   45  minutes were dedicated to this visit. The time was spent on reviewing laboratory data, discussing treatment options, discussing differential diagnosis and answering questions regarding future plan.      A copy of this consult has been forwarded to the requesting  provider.

## 2021-04-12 ENCOUNTER — Telehealth: Payer: Self-pay | Admitting: Adult Health

## 2021-04-12 NOTE — Telephone Encounter (Signed)
We can discuss low dose of vistaril or buspar. Would prefer not to go up on the Lorazepam.

## 2021-04-12 NOTE — Telephone Encounter (Signed)
LVM for pt to return call

## 2021-04-12 NOTE — Telephone Encounter (Signed)
[name] [name] called to request added dose of lorazepam.  She does not feel it is working as well as it use to.  Perhaps she has built up a tolerance.  But it is not holding of the panic and anxiety.  Asked for perhaps #15 more.  Or added something else to go along with the lorazepam.  Has appt 8/15 so if she just have something to help her through until then.  [facility] [address].  She also wanted you to know that she did not have the blood transfusion.  The Dr. wants her to try the medication a bit longer and until the procedure would be approved by insurance.

## 2021-04-12 NOTE — Telephone Encounter (Signed)
Please review

## 2021-04-15 NOTE — Telephone Encounter (Signed)
Attempted to reach pt again, no answer.

## 2021-04-16 ENCOUNTER — Other Ambulatory Visit: Payer: Self-pay

## 2021-04-16 MED ORDER — HYDROXYZINE HCL 10 MG PO TABS: 10.0000 mg | ORAL_TABLET | Freq: Three times a day (TID) | ORAL | 0 refills | Status: DC | PRN

## 2021-04-16 NOTE — Telephone Encounter (Signed)
She can take Hydroxyzine 10mg  three times daily as needed for anxiety.

## 2021-04-16 NOTE — Telephone Encounter (Signed)
Rx sent 

## 2021-04-16 NOTE — Telephone Encounter (Signed)
Pt would like to try vistaril.She also wanted to let you know when she went to the appt at the hematologist he recommend she take a iron supplement because her insurance won't approve a transfusion yet.

## 2021-04-16 NOTE — Telephone Encounter (Signed)
Send a Rx?

## 2021-04-16 NOTE — Telephone Encounter (Signed)
Yes

## 2021-04-26 ENCOUNTER — Encounter: Payer: Self-pay | Admitting: Nurse Practitioner

## 2021-04-28 ENCOUNTER — Other Ambulatory Visit: Payer: Self-pay | Admitting: Adult Health

## 2021-04-28 DIAGNOSIS — F331 Major depressive disorder, recurrent, moderate: Secondary | ICD-10-CM

## 2021-04-29 ENCOUNTER — Other Ambulatory Visit: Payer: Self-pay

## 2021-04-29 ENCOUNTER — Telehealth: Payer: BC Managed Care – PPO | Admitting: Adult Health

## 2021-04-29 ENCOUNTER — Ambulatory Visit: Payer: BC Managed Care – PPO | Admitting: Mental Health

## 2021-04-29 DIAGNOSIS — F411 Generalized anxiety disorder: Secondary | ICD-10-CM

## 2021-04-29 NOTE — Telephone Encounter (Signed)
Pt needs a refill on her ativan 1 mg to the [facility]  on  [address]

## 2021-04-30 MED ORDER — LORAZEPAM 1 MG PO TABS: ORAL_TABLET | ORAL | 0 refills | Status: DC

## 2021-05-01 ENCOUNTER — Other Ambulatory Visit: Payer: Self-pay | Admitting: Nurse Practitioner

## 2021-05-01 ENCOUNTER — Telehealth: Payer: Self-pay

## 2021-05-01 DIAGNOSIS — E8881 Metabolic syndrome: Secondary | ICD-10-CM

## 2021-05-01 NOTE — Telephone Encounter (Signed)
The pt states that she was checking to see if she if new med to replace metformin has been sent and she also needed a refill on Zofran for her nasuea. The pt was told that Christell Constant, DNP said that she would send a message when the med has been sent to the pharmacy.  The pt said she would also need to know if she needed to wait a few days after stopping metformin before starting the new med.

## 2021-05-01 NOTE — Telephone Encounter (Signed)
I left the pt a message at the pt's request that I was returning her call about Christell Constant, DNP, FNP-BC replacing the metformin, that the NP wanted to know if  the pt would be interested in a once wkly injection that would also help with weight loss.

## 2021-05-14 ENCOUNTER — Ambulatory Visit: Payer: BC Managed Care – PPO

## 2021-05-14 ENCOUNTER — Ambulatory Visit: Payer: BC Managed Care – PPO | Admitting: Adult Health

## 2021-05-22 ENCOUNTER — Other Ambulatory Visit: Payer: Self-pay

## 2021-05-22 ENCOUNTER — Ambulatory Visit: Payer: BC Managed Care – PPO

## 2021-05-22 DIAGNOSIS — E8881 Metabolic syndrome: Secondary | ICD-10-CM

## 2021-05-22 MED ORDER — ALCOHOL SWABS PADS: MEDICATED_PAD | 1 refills | Status: DC

## 2021-05-22 NOTE — Progress Notes (Signed)
Patient was shown how to use Monjaro. She understood instructions. YL,RMA

## 2021-05-30 ENCOUNTER — Telehealth: Payer: Self-pay | Admitting: Adult Health

## 2021-05-30 ENCOUNTER — Ambulatory Visit (INDEPENDENT_AMBULATORY_CARE_PROVIDER_SITE_OTHER): Payer: BC Managed Care – PPO | Admitting: Adult Health

## 2021-05-30 ENCOUNTER — Encounter: Payer: Self-pay | Admitting: Adult Health

## 2021-05-30 ENCOUNTER — Other Ambulatory Visit: Payer: Self-pay

## 2021-05-30 ENCOUNTER — Other Ambulatory Visit: Payer: Self-pay | Admitting: Adult Health

## 2021-05-30 DIAGNOSIS — F428 Other obsessive-compulsive disorder: Secondary | ICD-10-CM

## 2021-05-30 DIAGNOSIS — F909 Attention-deficit hyperactivity disorder, unspecified type: Secondary | ICD-10-CM | POA: Diagnosis not present

## 2021-05-30 DIAGNOSIS — F411 Generalized anxiety disorder: Secondary | ICD-10-CM | POA: Diagnosis not present

## 2021-05-30 DIAGNOSIS — F331 Major depressive disorder, recurrent, moderate: Secondary | ICD-10-CM | POA: Diagnosis not present

## 2021-05-30 MED ORDER — LORAZEPAM 1 MG PO TABS: ORAL_TABLET | ORAL | 3 refills | Status: DC

## 2021-05-30 MED ORDER — QUETIAPINE FUMARATE 25 MG PO TABS: ORAL_TABLET | ORAL | 2 refills | Status: DC

## 2021-05-30 MED ORDER — BUSPIRONE HCL 10 MG PO TABS: ORAL_TABLET | ORAL | 2 refills | Status: DC

## 2021-05-30 NOTE — Addendum Note (Signed)
Addended by: Dorothyann Gibbs on: 05/30/2021 05:48 PM   Modules accepted: Orders

## 2021-05-30 NOTE — Telephone Encounter (Signed)
[name] [name] called to follow up on visit this morning.  She said she was to remind you that you would again later today.  She requested refills of her Seroquel and Ativan.  Next appt 9/29.  Send to [facility] at [address]

## 2021-05-30 NOTE — Telephone Encounter (Signed)
Scripts sent. Will call after last patient.

## 2021-05-30 NOTE — Progress Notes (Signed)
[name] [name] [id number] 1990/11/01 30 y.o.  Subjective:   Patient ID:  [name] [name] is a 30 y.o. (DOB 1990/12/31) female.  Chief Complaint: No chief complaint on file.   HPI [name] [name] presents to the office today for follow-up of MDD, GAD, Obsessional thoughts, and ADHD.  Describes mood today as "ok". Pleasant. Tearful at times. Mood symptoms - reports depression, anxiety and irritability. Feels like anxiety is "higher". Increased worry. Reports panic attacks. Feels overwhelmed at times. Stating "I have a lot on my plate right now". Daughters have changed schools. Working with provider to manage low iron levels. Seeing Elio Forget - therapist. Journaling more. Improved interest and motivation.  Energy levels lower. Active, does not have a regular exercise routine. Pushing herself to take long walks. Enjoys some usual interests and activities. Married, but separated. Lives with 2 daughters. Family local and supportive.  Appetite adequate. Weight stable - 200+ pounds.  Sleeps well most nights. Waking up once a night. Averages 8 or more hours.  Focus and concentration difficulties. Completing tasks. Managing some aspects of household.  Denies SI or HI.  Denies AH or VH.  Previous medication trials: Adderall, Clonazepam, Lorazepam, Trazadone, Lexapro, Paxil, Zoloft.   PHQ2-9    Flowsheet Row Office Visit from 03/26/2021 in [facility] Video Visit from 01/03/2020 in Primary Care at [facility] Visit from 05/16/2019 in Primary Care at [facility] Telemedicine from 01/14/2019 in Primary Care at [facility] Visit from 08/06/2018 in Primary Care at [facility] Total Score 0 0 0 0 0        Review of Systems:  Review of Systems  Musculoskeletal:  Negative for gait problem.  Neurological:  Negative for tremors.  Psychiatric/Behavioral:         Please refer to HPI   Medications: I have reviewed the patient's current medications.  Current Outpatient  Medications  Medication Sig Dispense Refill   albuterol (VENTOLIN HFA) 108 (90 Base) MCG/ACT inhaler Inhale 1-2 puffs into the lungs every 6 (six) hours as needed for wheezing or shortness of breath. 18 g 3   Alcohol Swabs PADS Use as directed with mounjaro 100 each 1   doxycycline (VIBRA-TABS) 100 MG tablet Take 1 tablet (100 mg total) by mouth 2 (two) times daily. 20 tablet 0   fluticasone (FLONASE) 50 MCG/ACT nasal spray Place 1 spray into both nostrils 2 (two) times daily. 16 g 2   hydrochlorothiazide (HYDRODIURIL) 12.5 MG tablet Take 1 tablet (12.5 mg total) by mouth daily as needed. 30 tablet 2   hydrOXYzine (ATARAX/VISTARIL) 10 MG tablet Take 1 tablet (10 mg total) by mouth 3 (three) times daily as needed. 90 tablet 0   LORazepam (ATIVAN) 1 MG tablet Take one tablet three times daily as needed for anxiety. 90 tablet 0   metFORMIN (GLUCOPHAGE) 500 MG tablet Take 1 tablet (500 mg total) by mouth 2 (two) times daily with a meal. 180 tablet 1   mupirocin ointment (BACTROBAN) 2 % Apply 1 application topically 2 (two) times daily. 22 g 0   QUEtiapine (SEROQUEL) 25 MG tablet TAKE 1 TABLET(25 MG) BY MOUTH TWICE DAILY 60 tablet 0   No current facility-administered medications for this visit.    Medication Side Effects: None  Allergies:  Allergies  Allergen Reactions   Ceclor [Cefaclor] Hives    Age 28 mild rash Tolerates ceftriaxone   Latex Other (See Comments)    Irritation on skin   Zofran [Ondansetron]  Per mother. Patient has serotonin syndrome and can not have Zofran.     Past Medical History:  Diagnosis Date   ADHD (attention deficit hyperactivity disorder)    Anxiety    Panic attacks    Serotonin syndrome    Vaginal Pap smear, abnormal     Past Medical History, Surgical history, Social history, and Family history were reviewed and updated as appropriate.   Please see review of systems for further details on the patient's review from today.   Objective:   Physical  Exam:  There were no vitals taken for this visit.  Physical Exam Constitutional:      General: She is not in acute distress. Musculoskeletal:        General: No deformity.  Neurological:     Mental Status: She is alert and oriented to person, place, and time.     Coordination: Coordination normal.  Psychiatric:        Attention and Perception: Attention and perception normal. She does not perceive auditory or visual hallucinations.        Mood and Affect: Mood normal. Mood is not anxious or depressed. Affect is not labile, blunt, angry or inappropriate.        Speech: Speech normal.        Behavior: Behavior normal.        Thought Content: Thought content normal. Thought content is not paranoid or delusional. Thought content does not include homicidal or suicidal ideation. Thought content does not include homicidal or suicidal plan.        Cognition and Memory: Cognition and memory normal.        Judgment: Judgment normal.     Comments: Insight intact    Lab Review:     Component Value Date/Time   NA 143 04/04/2021 1236   K 4.4 04/04/2021 1236   CL 103 04/04/2021 1236   CO2 27 04/04/2021 1236   GLUCOSE 101 (H) 04/04/2021 1236   GLUCOSE 140 (H) 08/30/2020 1050   BUN 18 04/04/2021 1236   CREATININE 0.86 04/04/2021 1236   CALCIUM 10.0 04/04/2021 1236   PROT 7.2 04/04/2021 1236   ALBUMIN 4.4 04/04/2021 1236   AST 16 04/04/2021 1236   ALT 21 04/04/2021 1236   ALKPHOS 119 04/04/2021 1236   BILITOT 0.3 04/04/2021 1236   GFRNONAA >60 08/30/2020 1050   GFRAA 133 06/29/2017 1224       Component Value Date/Time   WBC 11.7 (H) 08/30/2020 1050   RBC 4.90 08/30/2020 1050   HGB 14.5 08/30/2020 1050   HGB 13.7 06/29/2017 1224   HGB 12.7 06/06/2015 0000   HCT 43.9 08/30/2020 1050   HCT 41.0 06/29/2017 1224   HCT 37 06/06/2015 0000   PLT 480 (H) 08/30/2020 1050   PLT 312 06/29/2017 1224   PLT 300 06/06/2015 0000   MCV 89.6 08/30/2020 1050   MCV 92.5 02/16/2018 1224   MCV 93  06/29/2017 1224   MCH 29.6 08/30/2020 1050   MCHC 33.0 08/30/2020 1050   RDW 12.4 08/30/2020 1050   RDW 13.3 06/29/2017 1224   LYMPHSABS 1.9 08/30/2020 1050   LYMPHSABS 1.7 06/29/2017 1224   MONOABS 0.6 08/30/2020 1050   EOSABS 0.0 08/30/2020 1050   EOSABS 0.2 06/29/2017 1224   BASOSABS 0.1 08/30/2020 1050   BASOSABS 0.0 06/29/2017 1224    No results found for: POCLITH, LITHIUM   No results found for: PHENYTOIN, PHENOBARB, VALPROATE, CBMZ   .res Assessment: Plan:    Plan:  PDMP  reviewed  Current medications:  1. Lorazepam 1mg  TID - will send in a 2 week supply with a refill. 2. Seroquel 25mg  - 2 at bedtime - taking one at bedtime.  Has added metformin  Seeing therapist - .  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  RTC 4 weeks  Discussed restarting stimulant - will not add stimulant in at this time. Discussed recent psychosis and hospitalization and possible recurrence of psychosis.   Patient advised to contact office with any questions, adverse effects, or acute worsening in signs and symptoms.  Discussed potential benefits, risks, and side effects of stimulants with patient to include increased heart rate, palpitations, insomnia, increased anxiety, increased irritability, or decreased appetite.  Instructed patient to contact office if experiencing any significant tolerability issues.  Discussed potential metabolic side effects associated with atypical antipsychotics, as well as potential risk for movement side effects. Advised pt to contact office if movement side effects occur.   Discussed potential benefits, risk, and side effects of benzodiazepines to include potential risk of tolerance and dependence, as well as possible drowsiness.  Advised patient not to drive if experiencing drowsiness and to take lowest possible effective dose to minimize risk of dependence and tolerance.   There are no diagnoses linked to this encounter.   Please see After Visit  Summary for patient specific instructions.  Future Appointments  Date Time Provider Department Center  05/30/2021  8:00 AM Terin Cragle, Elio Forget, NP [facility] None  06/04/2021  9:45 AM Thereasa Solo, FNP [facility] None  06/26/2021  2:30 PM [facility] None  06/26/2021  3:00 PM Shadad, 08/26/2021, MD [facility] None    No orders of the defined types were placed in this encounter.   -------------------------------

## 2021-05-30 NOTE — Telephone Encounter (Signed)
Looks like you were wanting her to just have 2 week supply of Ativan sent to her pharmacy

## 2021-06-04 ENCOUNTER — Ambulatory Visit: Payer: BC Managed Care – PPO | Admitting: Nurse Practitioner

## 2021-06-13 ENCOUNTER — Other Ambulatory Visit: Payer: Self-pay

## 2021-06-13 ENCOUNTER — Encounter: Payer: Self-pay | Admitting: Adult Health

## 2021-06-13 ENCOUNTER — Ambulatory Visit (INDEPENDENT_AMBULATORY_CARE_PROVIDER_SITE_OTHER): Payer: BC Managed Care – PPO | Admitting: Adult Health

## 2021-06-13 DIAGNOSIS — F428 Other obsessive-compulsive disorder: Secondary | ICD-10-CM

## 2021-06-13 DIAGNOSIS — F331 Major depressive disorder, recurrent, moderate: Secondary | ICD-10-CM | POA: Diagnosis not present

## 2021-06-13 DIAGNOSIS — F909 Attention-deficit hyperactivity disorder, unspecified type: Secondary | ICD-10-CM | POA: Diagnosis not present

## 2021-06-13 DIAGNOSIS — F411 Generalized anxiety disorder: Secondary | ICD-10-CM | POA: Diagnosis not present

## 2021-06-13 NOTE — Progress Notes (Signed)
[name] [name] [id number] 1991/07/10 30 y.o.  Subjective:   Patient ID:  [name] [name] is a 30 y.o. (DOB 1990/12/26) female.  Chief Complaint: No chief complaint on file.   HPI [name] [name] presents to the office today for follow-up of MDD, GAD, Obsessional thoughts, and ADHD.  Describes mood today as "ok". Pleasant. Tearful at times. Mood symptoms - reports decreased depression - "I feel like a weight has been lifted from my shoulders". Feels anxious and irritable at times. Decreased worry and rumination. Denies panic attacks. Feels like anxiety is more "manageable" with addition of Buspar. Stating "I feel so much better". Daughters have returned to their previous school - "that has given me piece of mind". Taking iron supplementation regularly. Seeing Elio Forget - therapist. Journaling more. Improved interest and motivation. Energy levels improving. Active, does not have a regular exercise routine. Walking. Enjoys some usual interests and activities. Married, but separated. Lives with 2 daughters. Family local and supportive.  Appetite adequate. Weight stable - 200+ pounds.  Sleeps well most nights. Waking up during the night to check on her daughters. Averages 8 or more hours.  Focus and concentration difficulties. Completing tasks. Managing some aspects of household.  Denies SI or HI.  Denies AH or VH.  Previous medication trials: Adderall, Clonazepam, Lorazepam, Trazadone, Lexapro, Paxil, Zoloft.   PHQ2-9    Flowsheet Row Office Visit from 03/26/2021 in [facility] Video Visit from 01/03/2020 in Primary Care at [facility] Visit from 05/16/2019 in Primary Care at [facility] Telemedicine from 01/14/2019 in Primary Care at [facility] Visit from 08/06/2018 in Primary Care at [facility] Total Score 0 0 0 0 0        Review of Systems:  Review of Systems  Musculoskeletal:  Negative for gait problem.  Neurological:  Negative for tremors.   Psychiatric/Behavioral:         Please refer to HPI   Medications: I have reviewed the patient's current medications.  Current Outpatient Medications  Medication Sig Dispense Refill   albuterol (VENTOLIN HFA) 108 (90 Base) MCG/ACT inhaler Inhale 1-2 puffs into the lungs every 6 (six) hours as needed for wheezing or shortness of breath. 18 g 3   Alcohol Swabs PADS Use as directed with mounjaro 100 each 1   busPIRone (BUSPAR) 10 MG tablet Take 1/2 tablet twice daily for 7 days, then take one tablet twice daily. 60 tablet 2   doxycycline (VIBRA-TABS) 100 MG tablet Take 1 tablet (100 mg total) by mouth 2 (two) times daily. 20 tablet 0   fluticasone (FLONASE) 50 MCG/ACT nasal spray Place 1 spray into both nostrils 2 (two) times daily. 16 g 2   hydrochlorothiazide (HYDRODIURIL) 12.5 MG tablet Take 1 tablet (12.5 mg total) by mouth daily as needed. 30 tablet 2   hydrOXYzine (ATARAX/VISTARIL) 10 MG tablet Take 1 tablet (10 mg total) by mouth 3 (three) times daily as needed. 90 tablet 0   LORazepam (ATIVAN) 1 MG tablet Take one tablet three times daily as needed for anxiety. 45 tablet 3   metFORMIN (GLUCOPHAGE) 500 MG tablet Take 1 tablet (500 mg total) by mouth 2 (two) times daily with a meal. 180 tablet 1   mupirocin ointment (BACTROBAN) 2 % Apply 1 application topically 2 (two) times daily. 22 g 0   QUEtiapine (SEROQUEL) 25 MG tablet TAKE 1 TABLET(25 MG) BY MOUTH TWICE DAILY 60 tablet 2   No current facility-administered medications for this visit.  Medication Side Effects: None  Allergies:  Allergies  Allergen Reactions   Ceclor [Cefaclor] Hives    Age 10 mild rash Tolerates ceftriaxone   Ondansetron     Per mother. Patient has serotonin syndrome and can not have Zofran.     Past Medical History:  Diagnosis Date   ADHD (attention deficit hyperactivity disorder)    Anxiety    Panic attacks    Serotonin syndrome    Vaginal Pap smear, abnormal     Past Medical History, Surgical  history, Social history, and Family history were reviewed and updated as appropriate.   Please see review of systems for further details on the patient's review from today.   Objective:   Physical Exam:  There were no vitals taken for this visit.  Physical Exam Constitutional:      General: She is not in acute distress. Musculoskeletal:        General: No deformity.  Neurological:     Mental Status: She is alert and oriented to person, place, and time.     Coordination: Coordination normal.  Psychiatric:        Attention and Perception: Attention and perception normal. She does not perceive auditory or visual hallucinations.        Mood and Affect: Mood normal. Mood is not anxious or depressed. Affect is not labile, blunt, angry or inappropriate.        Speech: Speech normal.        Behavior: Behavior normal.        Thought Content: Thought content normal. Thought content is not paranoid or delusional. Thought content does not include homicidal or suicidal ideation. Thought content does not include homicidal or suicidal plan.        Cognition and Memory: Cognition and memory normal.        Judgment: Judgment normal.     Comments: Insight intact    Lab Review:     Component Value Date/Time   NA 143 04/04/2021 1236   K 4.4 04/04/2021 1236   CL 103 04/04/2021 1236   CO2 27 04/04/2021 1236   GLUCOSE 101 (H) 04/04/2021 1236   GLUCOSE 140 (H) 08/30/2020 1050   BUN 18 04/04/2021 1236   CREATININE 0.86 04/04/2021 1236   CALCIUM 10.0 04/04/2021 1236   PROT 7.2 04/04/2021 1236   ALBUMIN 4.4 04/04/2021 1236   AST 16 04/04/2021 1236   ALT 21 04/04/2021 1236   ALKPHOS 119 04/04/2021 1236   BILITOT 0.3 04/04/2021 1236   GFRNONAA >60 08/30/2020 1050   GFRAA 133 06/29/2017 1224       Component Value Date/Time   WBC 11.7 (H) 08/30/2020 1050   RBC 4.90 08/30/2020 1050   HGB 14.5 08/30/2020 1050   HGB 13.7 06/29/2017 1224   HGB 12.7 06/06/2015 0000   HCT 43.9 08/30/2020 1050    HCT 41.0 06/29/2017 1224   HCT 37 06/06/2015 0000   PLT 480 (H) 08/30/2020 1050   PLT 312 06/29/2017 1224   PLT 300 06/06/2015 0000   MCV 89.6 08/30/2020 1050   MCV 92.5 02/16/2018 1224   MCV 93 06/29/2017 1224   MCH 29.6 08/30/2020 1050   MCHC 33.0 08/30/2020 1050   RDW 12.4 08/30/2020 1050   RDW 13.3 06/29/2017 1224   LYMPHSABS 1.9 08/30/2020 1050   LYMPHSABS 1.7 06/29/2017 1224   MONOABS 0.6 08/30/2020 1050   EOSABS 0.0 08/30/2020 1050   EOSABS 0.2 06/29/2017 1224   BASOSABS 0.1 08/30/2020 1050   BASOSABS 0.0  06/29/2017 1224    No results found for: POCLITH, LITHIUM   No results found for: PHENYTOIN, PHENOBARB, VALPROATE, CBMZ   .res Assessment: Plan:    Plan:  PDMP reviewed  Current medications:  1. Lorazepam 1mg  TID - will send in a 2 week supply with a refill. 2. Seroquel 25mg  - 2 at bedtime - taking one at bedtime. 3. Buspar 10mg  BID  Seeing therapist - .  RTC 4 weeks  Discussed restarting stimulant - will not add stimulant in at this time. Discussed recent psychosis and hospitalization and possible recurrence of psychosis.   Patient advised to contact office with any questions, adverse effects, or acute worsening in signs and symptoms.  Discussed potential metabolic side effects associated with atypical antipsychotics, as well as potential risk for movement side effects. Advised pt to contact office if movement side effects occur.   Discussed potential benefits, risk, and side effects of benzodiazepines to include potential risk of tolerance and dependence, as well as possible drowsiness.  Advised patient not to drive if experiencing drowsiness and to take lowest possible effective dose to minimize risk of dependence and tolerance.   Diagnoses and all orders for this visit:  Major depressive disorder, recurrent episode, moderate (HCC)  Generalized anxiety disorder  Attention deficit hyperactivity disorder (ADHD), unspecified ADHD  type  Obsessional thoughts    Please see After Visit Summary for patient specific instructions.  Future Appointments  Date Time Provider Department Center  06/17/2021  9:00 AM , [facility] None  06/18/2021  2:45 PM Waldron Session, FNP [facility] None  06/26/2021  2:30 PM [facility] None  06/26/2021  3:00 PM Shadad, Arnette Felts, MD [facility] None    No orders of the defined types were placed in this encounter.   -------------------------------

## 2021-06-17 ENCOUNTER — Ambulatory Visit: Payer: BC Managed Care – PPO | Admitting: Mental Health

## 2021-06-18 ENCOUNTER — Ambulatory Visit (INDEPENDENT_AMBULATORY_CARE_PROVIDER_SITE_OTHER): Payer: BC Managed Care – PPO | Admitting: Nurse Practitioner

## 2021-06-18 ENCOUNTER — Other Ambulatory Visit: Payer: Self-pay

## 2021-06-18 ENCOUNTER — Encounter: Payer: Self-pay | Admitting: Nurse Practitioner

## 2021-06-18 VITALS — BP 120/84 | HR 119 | Temp 98.1°F | Ht 64.8 in | Wt 230.2 lb

## 2021-06-18 DIAGNOSIS — E8881 Metabolic syndrome: Secondary | ICD-10-CM | POA: Diagnosis not present

## 2021-06-18 DIAGNOSIS — Z6838 Body mass index (BMI) 38.0-38.9, adult: Secondary | ICD-10-CM

## 2021-06-18 DIAGNOSIS — Z23 Encounter for immunization: Secondary | ICD-10-CM

## 2021-06-18 DIAGNOSIS — E6609 Other obesity due to excess calories: Secondary | ICD-10-CM

## 2021-06-18 MED ORDER — MOUNJARO 5 MG/0.5ML ~~LOC~~ SOAJ: 5.0000 mg | SUBCUTANEOUS | 2 refills | Status: DC

## 2021-06-18 NOTE — Progress Notes (Signed)
I,Katawbba Wiggins,acting as a Neurosurgeon for [employer], FNP.,have documented all relevant documentation on the behalf of Arnette Felts, FNP,as directed by  Arnette Felts, FNP while in the presence of Arnette Felts, FNP.   This visit occurred during the SARS-CoV-2 public health emergency.  Safety protocols were in place, including screening questions prior to the visit, additional usage of staff PPE, and extensive cleaning of exam room while observing appropriate contact time as indicated for disinfecting solutions.  Subjective:     Patient ID: [name] [name] , female    DOB: Oct 03, 1990 , 30 y.o.   MRN: [id number]   Chief Complaint  Patient presents with   medication f/u     HPI  The patient is here today for a follow-up on her Monjoura. She used her last pen for Monjouro last week on Wednesday.  She is currently making a lifestyle change by cutting back on sodas and eating more healthy. She has been walking more. She notices the medication may be wearing off towards the end of the week. She is doing more physical activity. Going out with the kids.   She continues to see Rene Kocher (therapist) who was asking about increasing her dose.  She is feeling much better. She is no longer having the swelling.     Past Medical History:  Diagnosis Date   ADHD (attention deficit hyperactivity disorder)    Anxiety    Panic attacks    Serotonin syndrome    Vaginal Pap smear, abnormal      Family History  Problem Relation Age of Onset   Hypertension Mother    Heart disease Father    Cancer Father        testicular   Hypertension Father    Hyperlipidemia Father    Heart disease Paternal Grandfather      Current Outpatient Medications:    albuterol (VENTOLIN HFA) 108 (90 Base) MCG/ACT inhaler, Inhale 1-2 puffs into the lungs every 6 (six) hours as needed for wheezing or shortness of breath., Disp: 18 g, Rfl: 3   Alcohol Swabs PADS, Use as directed with mounjaro, Disp: 100 each, Rfl: 1    busPIRone (BUSPAR) 10 MG tablet, Take 1/2 tablet twice daily for 7 days, then take one tablet twice daily., Disp: 60 tablet, Rfl: 2   doxycycline (VIBRA-TABS) 100 MG tablet, Take 1 tablet (100 mg total) by mouth 2 (two) times daily., Disp: 20 tablet, Rfl: 0   hydrochlorothiazide (HYDRODIURIL) 12.5 MG tablet, Take 1 tablet (12.5 mg total) by mouth daily as needed., Disp: 30 tablet, Rfl: 2   LORazepam (ATIVAN) 1 MG tablet, Take one tablet three times daily as needed for anxiety., Disp: 45 tablet, Rfl: 3   mupirocin ointment (BACTROBAN) 2 %, Apply 1 application topically 2 (two) times daily., Disp: 22 g, Rfl: 0   QUEtiapine (SEROQUEL) 25 MG tablet, TAKE 1 TABLET(25 MG) BY MOUTH TWICE DAILY, Disp: 60 tablet, Rfl: 2   tirzepatide (MOUNJARO) 5 MG/0.5ML Pen, Inject 5 mg into the skin once a week., Disp: 2 mL, Rfl: 2   fluticasone (FLONASE) 50 MCG/ACT nasal spray, SHAKE LIQUID AND USE 1 SPRAY IN EACH NOSTRIL TWICE DAILY, Disp: 16 g, Rfl: 2   hydrOXYzine (ATARAX/VISTARIL) 10 MG tablet, TAKE 1 TABLET(10 MG) BY MOUTH THREE TIMES DAILY AS NEEDED, Disp: 90 tablet, Rfl: 0   Allergies  Allergen Reactions   Ceclor [Cefaclor] Hives    Age 57 mild rash Tolerates ceftriaxone   Ondansetron     Per mother. Patient  has serotonin syndrome and can not have Zofran.      Review of Systems  Constitutional: Negative.   Respiratory: Negative.    Cardiovascular: Negative.  Negative for chest pain, palpitations and leg swelling.  Gastrointestinal: Negative.   Neurological:  Negative for dizziness and headaches.  Psychiatric/Behavioral: Negative.    All other systems reviewed and are negative.   Today's Vitals   06/18/21 1456  BP: 120/84  Pulse: (!) 119  Temp: 98.1 F (36.7 C)  Weight: 230 lb 3.2 oz (104.4 kg)  Height: 5' 4.8" (1.646 m)   Body mass index is 38.54 kg/m.  Wt Readings from Last 3 Encounters:  06/18/21 230 lb 3.2 oz (104.4 kg)  04/09/21 245 lb (111.1 kg)  04/04/21 247 lb 12.8 oz (112.4 kg)     BP Readings from Last 3 Encounters:  06/18/21 120/84  04/09/21 131/84  04/04/21 122/84    Objective:  Physical Exam Constitutional:      General: She is not in acute distress.    Appearance: Normal appearance.  Cardiovascular:     Rate and Rhythm: Normal rate and regular rhythm.     Pulses: Normal pulses.     Heart sounds: Normal heart sounds. No murmur heard. Pulmonary:     Effort: Pulmonary effort is normal. No respiratory distress.     Breath sounds: No wheezing.  Neurological:     Mental Status: She is alert.  Psychiatric:        Mood and Affect: Mood normal.        Behavior: Behavior normal.        Thought Content: Thought content normal.        Judgment: Judgment normal.        Assessment And Plan:     1. Insulin resistance She is doing well and tolerating good, she has only been on the medication for 3 weeks will not check labs at this time - tirzepatide [facility]) 5 MG/0.5ML Pen; Inject 5 mg into the skin once a week.  Dispense: 2 mL; Refill: 2  2. Immunization due Influenza vaccine administered Encouraged to take Tylenol as needed for fever or muscle aches. - Flu Vaccine QUAD 6+ mos PF IM (Fluarix Quad PF)  3. Class 2 obesity due to excess calories without serious comorbidity with body mass index (BMI) of 38.0 to 38.9 in adult  She is encouraged to strive for BMI less than 30 to decrease cardiac risk. Advised to aim for at least 150 minutes of exercise per week.  She has lost 15 lbs since July, encouraged her to focus on eating a healthy diet and continue with regular exercise.    Patient was given opportunity to ask questions. Patient verbalized understanding of the plan and was able to repeat key elements of the plan. All questions were answered to their satisfaction.  Arnette Felts, FNP   I, Arnette Felts, FNP, have reviewed all documentation for this visit. The documentation on 06/18/21 for the exam, diagnosis, procedures, and orders are all accurate and  complete.   IF YOU HAVE BEEN REFERRED TO A SPECIALIST, IT MAY TAKE 1-2 WEEKS TO SCHEDULE/PROCESS THE REFERRAL. IF YOU HAVE NOT HEARD FROM US/SPECIALIST IN TWO WEEKS, PLEASE GIVE [place] A CALL AT [phone].   THE PATIENT IS ENCOURAGED TO PRACTICE SOCIAL DISTANCING DUE TO THE COVID-19 PANDEMIC.

## 2021-06-18 NOTE — Patient Instructions (Signed)
Influenza (Flu) Vaccine (Inactivated or Recombinant): What You Need to Know 1. Why get vaccinated? Influenza vaccine can prevent influenza (flu). Flu is a contagious disease that spreads around the [place] every year, usually between October and May. Anyone can get the flu, but it is more dangerous for some people. Infants and young children, people 65 years and older, pregnant people, and people with certain health conditions or a weakened immune system are at greatest risk of flu complications. Pneumonia, bronchitis, sinus infections, and ear infections are examples of flu-related complications. If you have a medical condition, such as heart disease, cancer, or diabetes, flu can make it worse. Flu can cause fever and chills, sore throat, muscle aches, fatigue, cough, headache, and runny or stuffy nose. Some people may have vomiting and diarrhea, though this is more common in children than adults. In an average year, thousands of people in the [place] die from flu, and many more are hospitalized. Flu vaccine prevents millions of illnesses and flu-related visits to the doctor each year. 2. Influenza vaccines CDC recommends everyone 6 months and older get vaccinated every flu season. Children 6 months through 8 years of age may need 2 doses during a single flu season. Everyone else needs only 1 dose each flu season. It takes about 2 weeks for protection to develop after vaccination. There are many flu viruses, and they are always changing. Each year a new flu vaccine is made to protect against the influenza viruses believed to be likely to cause disease in the upcoming flu season. Even when the vaccine doesn't exactly match these viruses, it may still provide some protection. Influenza vaccine does not cause flu. Influenza vaccine may be given at the same time as other vaccines. 3. Talk with your health care provider Tell your vaccination provider if the person getting the vaccine: Has had  an allergic reaction after a previous dose of influenza vaccine, or has any severe, life-threatening allergies Has ever had Guillain-Barr Syndrome (also called "GBS") In some cases, your health care provider may decide to postpone influenza vaccination until a future visit. Influenza vaccine can be administered at any time during pregnancy. People who are or will be pregnant during influenza season should receive inactivated influenza vaccine. People with minor illnesses, such as a cold, may be vaccinated. People who are moderately or severely ill should usually wait until they recover before getting influenza vaccine. Your health care provider can give you more information. 4. Risks of a vaccine reaction Soreness, redness, and swelling where the shot is given, fever, muscle aches, and headache can happen after influenza vaccination. There may be a very small increased risk of Guillain-Barr Syndrome (GBS) after inactivated influenza vaccine (the flu shot). Young children who get the flu shot along with pneumococcal vaccine (PCV13) and/or DTaP vaccine at the same time might be slightly more likely to have a seizure caused by fever. Tell your health care provider if a child who is getting flu vaccine has ever had a seizure. People sometimes faint after medical procedures, including vaccination. Tell your provider if you feel dizzy or have vision changes or ringing in the ears. As with any medicine, there is a very remote chance of a vaccine causing a severe allergic reaction, other serious injury, or death. 5. What if there is a serious problem? An allergic reaction could occur after the vaccinated person leaves the clinic. If you see signs of a severe allergic reaction (hives, swelling of the face and throat, difficulty breathing,   a fast heartbeat, dizziness, or weakness), call 9-1-1 and get the person to the nearest hospital. For other signs that concern you, call your health care provider. Adverse  reactions should be reported to the Vaccine Adverse Event Reporting System (VAERS). Your health care provider will usually file this report, or you can do it yourself. Visit the VAERS website at www.vaers.hhs.gov or call 1-800-822-7967. VAERS is only for reporting reactions, and VAERS staff members do not give medical advice. 6. The National Vaccine Injury Compensation Program The National Vaccine Injury Compensation Program (VICP) is a federal program that was created to compensate people who may have been injured by certain vaccines. Claims regarding alleged injury or death due to vaccination have a time limit for filing, which may be as short as two years. Visit the VICP website at www.hrsa.gov/vaccinecompensation or call 1-800-338-2382 to learn about the program and about filing a claim. 7. How can I learn more? Ask your health care provider. Call your local or state health department. Visit the website of the Food and Drug Administration (FDA) for vaccine package inserts and additional information at www.fda.gov/vaccines-blood-biologics/vaccines. Contact the Centers for Disease Control and Prevention (CDC): Call 1-800-232-4636 (1-800-CDC-INFO) or Visit CDC's website at www.cdc.gov/flu. Vaccine Information Statement Inactivated Influenza Vaccine (04/20/2020) This information is not intended to replace advice given to you by your health care provider. Make sure you discuss any questions you have with your health care provider. Document Revised: 06/07/2020 Document Reviewed: 06/07/2020 Elsevier Patient Education  2022 Elsevier Inc.  

## 2021-06-19 ENCOUNTER — Other Ambulatory Visit: Payer: Self-pay | Admitting: Nurse Practitioner

## 2021-06-19 ENCOUNTER — Other Ambulatory Visit: Payer: Self-pay | Admitting: Adult Health

## 2021-06-19 DIAGNOSIS — R0981 Nasal congestion: Secondary | ICD-10-CM

## 2021-06-21 ENCOUNTER — Other Ambulatory Visit: Payer: Self-pay

## 2021-06-21 NOTE — Telephone Encounter (Signed)
Prior auth done for [facility], waiting on a response from the [employer].

## 2021-06-24 ENCOUNTER — Ambulatory Visit: Payer: BC Managed Care – PPO | Admitting: Nurse Practitioner

## 2021-06-26 ENCOUNTER — Inpatient Hospital Stay: Payer: BC Managed Care – PPO | Admitting: Oncology

## 2021-06-26 ENCOUNTER — Inpatient Hospital Stay: Payer: BC Managed Care – PPO

## 2021-06-26 ENCOUNTER — Telehealth: Payer: Self-pay | Admitting: Oncology

## 2021-06-26 NOTE — Telephone Encounter (Signed)
Rescheduled per 10/12 in basket, message has been left with pt

## 2021-07-03 ENCOUNTER — Encounter: Payer: Self-pay | Admitting: Adult Health

## 2021-07-03 ENCOUNTER — Telehealth (INDEPENDENT_AMBULATORY_CARE_PROVIDER_SITE_OTHER): Payer: BC Managed Care – PPO | Admitting: Adult Health

## 2021-07-03 DIAGNOSIS — F331 Major depressive disorder, recurrent, moderate: Secondary | ICD-10-CM | POA: Diagnosis not present

## 2021-07-03 DIAGNOSIS — F428 Other obsessive-compulsive disorder: Secondary | ICD-10-CM

## 2021-07-03 DIAGNOSIS — F909 Attention-deficit hyperactivity disorder, unspecified type: Secondary | ICD-10-CM

## 2021-07-03 DIAGNOSIS — F411 Generalized anxiety disorder: Secondary | ICD-10-CM | POA: Diagnosis not present

## 2021-07-03 MED ORDER — BUSPIRONE HCL 10 MG PO TABS: ORAL_TABLET | ORAL | 2 refills | Status: DC

## 2021-07-03 MED ORDER — LORAZEPAM 1 MG PO TABS: ORAL_TABLET | ORAL | 3 refills | Status: DC

## 2021-07-03 MED ORDER — QUETIAPINE FUMARATE 25 MG PO TABS: ORAL_TABLET | ORAL | 2 refills | Status: DC

## 2021-07-03 NOTE — Progress Notes (Signed)
[name] [name] [id number] 1991-01-16 30 y.o.  Virtual Visit via Video Note  I connected with pt @ on 07/03/21 at  9:00 AM EDT by a video enabled telemedicine application and verified that I am speaking with the correct person using two identifiers.   I discussed the limitations of evaluation and management by telemedicine and the availability of in person appointments. The patient expressed understanding and agreed to proceed.  I discussed the assessment and treatment plan with the patient. The patient was provided an opportunity to ask questions and all were answered. The patient agreed with the plan and demonstrated an understanding of the instructions.   The patient was advised to call back or seek an in-person evaluation if the symptoms worsen or if the condition fails to improve as anticipated.  I provided 25 minutes of non-face-to-face time during this encounter.  The patient was located at home.  The provider was located at [facility].   [name] Gibbs, NP   Subjective:   Patient ID:  [name] [name] is a 30 y.o. (DOB May 10, 1991) female.  Chief Complaint: No chief complaint on file.   HPI [name] [name] presents for follow-up of MDD, GAD, Obsessional thoughts, and ADHD.  Describes mood today as "ok". Pleasant. Tearful at times. Mood symptoms - denies depression and irritability. Feels like anxiety is increased at times - "I'm trying to manage it".  Decreased worry and rumination. Denies panic attacks.  Stating "I feel like I'm doing ok". Daughters doing well. Working with PCP. Seeing Elio Forget - therapist. Improved interest and motivation. Energy levels improved. Active, does not have a regular exercise routine. Walking. Enjoys some usual interests and activities. Married, but separated. Lives with 2 daughters. Family local and supportive.  Appetite adequate. Weight loss - 17 pounds - 210 pounds.  Sleeps well most nights - staying up a little later. Averages 9  to 10 or more hours.  Focus and concentration improved. Completing tasks. Managing some aspects of household. Unemployed. Denies SI or HI.  Denies AH or VH.  Previous medication trials: Adderall, Clonazepam, Lorazepam, Trazadone, Lexapro, Paxil, Zoloft.   Review of Systems:  Review of Systems  Musculoskeletal:  Negative for gait problem.  Neurological:  Negative for tremors.  Psychiatric/Behavioral:         Please refer to HPI   Medications: I have reviewed the patient's current medications.  Current Outpatient Medications  Medication Sig Dispense Refill   albuterol (VENTOLIN HFA) 108 (90 Base) MCG/ACT inhaler Inhale 1-2 puffs into the lungs every 6 (six) hours as needed for wheezing or shortness of breath. 18 g 3   Alcohol Swabs PADS Use as directed with mounjaro 100 each 1   busPIRone (BUSPAR) 10 MG tablet Take 1/2 tablet twice daily for 7 days, then take one tablet twice daily. 60 tablet 2   doxycycline (VIBRA-TABS) 100 MG tablet Take 1 tablet (100 mg total) by mouth 2 (two) times daily. 20 tablet 0   fluticasone (FLONASE) 50 MCG/ACT nasal spray SHAKE LIQUID AND USE 1 SPRAY IN EACH NOSTRIL TWICE DAILY 16 g 2   hydrochlorothiazide (HYDRODIURIL) 12.5 MG tablet Take 1 tablet (12.5 mg total) by mouth daily as needed. 30 tablet 2   hydrOXYzine (ATARAX/VISTARIL) 10 MG tablet TAKE 1 TABLET(10 MG) BY MOUTH THREE TIMES DAILY AS NEEDED 90 tablet 0   LORazepam (ATIVAN) 1 MG tablet Take one tablet three times daily as needed for anxiety. 45 tablet 3   mupirocin ointment (BACTROBAN) 2 % Apply  1 application topically 2 (two) times daily. 22 g 0   QUEtiapine (SEROQUEL) 25 MG tablet TAKE 1 TABLET(25 MG) BY MOUTH TWICE DAILY 60 tablet 2   tirzepatide (MOUNJARO) 5 MG/0.5ML Pen Inject 5 mg into the skin once a week. 2 mL 2   No current facility-administered medications for this visit.    Medication Side Effects: None  Allergies:  Allergies  Allergen Reactions   Ceclor [Cefaclor] Hives    Age  31 mild rash Tolerates ceftriaxone   Ondansetron     Per mother. Patient has serotonin syndrome and can not have Zofran.     Past Medical History:  Diagnosis Date   ADHD (attention deficit hyperactivity disorder)    Anxiety    Panic attacks    Serotonin syndrome    Vaginal Pap smear, abnormal     Family History  Problem Relation Age of Onset   Hypertension Mother    Heart disease Father    Cancer Father        testicular   Hypertension Father    Hyperlipidemia Father    Heart disease Paternal Grandfather     Social History   Socioeconomic History   Marital status: Married    Spouse name: Not on file   Number of children: Not on file   Years of education: Not on file   Highest education level: Not on file  Occupational History   Not on file  Tobacco Use   Smoking status: Former    Types: Cigarettes    Quit date: 08/15/2013    Years since quitting: 7.8   Smokeless tobacco: Never  Substance and Sexual Activity   Alcohol use: No    Alcohol/week: 2.0 standard drinks    Types: 2 Standard drinks or equivalent per week    Comment: socially, not with preg   Drug use: No   Sexual activity: Not Currently    Birth control/protection: I.U.D.  Other Topics Concern   Not on file  Social History Narrative   Not on file   Social Determinants of Health   Financial Resource Strain: Not on file  Food Insecurity: Not on file  Transportation Needs: Not on file  Physical Activity: Not on file  Stress: Not on file  Social Connections: Not on file  Intimate Partner Violence: Not on file    Past Medical History, Surgical history, Social history, and Family history were reviewed and updated as appropriate.   Please see review of systems for further details on the patient's review from today.   Objective:   Physical Exam:  There were no vitals taken for this visit.  Physical Exam Constitutional:      General: She is not in acute distress. Musculoskeletal:         General: No deformity.  Neurological:     Mental Status: She is alert and oriented to person, place, and time.     Coordination: Coordination normal.  Psychiatric:        Attention and Perception: Attention and perception normal. She does not perceive auditory or visual hallucinations.        Mood and Affect: Mood normal. Mood is not anxious or depressed. Affect is not labile, blunt, angry or inappropriate.        Speech: Speech normal.        Behavior: Behavior normal.        Thought Content: Thought content normal. Thought content is not paranoid or delusional. Thought content does not include homicidal or suicidal  ideation. Thought content does not include homicidal or suicidal plan.        Cognition and Memory: Cognition and memory normal.        Judgment: Judgment normal.     Comments: Insight intact    Lab Review:     Component Value Date/Time   NA 143 04/04/2021 1236   K 4.4 04/04/2021 1236   CL 103 04/04/2021 1236   CO2 27 04/04/2021 1236   GLUCOSE 101 (H) 04/04/2021 1236   GLUCOSE 140 (H) 08/30/2020 1050   BUN 18 04/04/2021 1236   CREATININE 0.86 04/04/2021 1236   CALCIUM 10.0 04/04/2021 1236   PROT 7.2 04/04/2021 1236   ALBUMIN 4.4 04/04/2021 1236   AST 16 04/04/2021 1236   ALT 21 04/04/2021 1236   ALKPHOS 119 04/04/2021 1236   BILITOT 0.3 04/04/2021 1236   GFRNONAA >60 08/30/2020 1050   GFRAA 133 06/29/2017 1224       Component Value Date/Time   WBC 11.7 (H) 08/30/2020 1050   RBC 4.90 08/30/2020 1050   HGB 14.5 08/30/2020 1050   HGB 13.7 06/29/2017 1224   HGB 12.7 06/06/2015 0000   HCT 43.9 08/30/2020 1050   HCT 41.0 06/29/2017 1224   HCT 37 06/06/2015 0000   PLT 480 (H) 08/30/2020 1050   PLT 312 06/29/2017 1224   PLT 300 06/06/2015 0000   MCV 89.6 08/30/2020 1050   MCV 92.5 02/16/2018 1224   MCV 93 06/29/2017 1224   MCH 29.6 08/30/2020 1050   MCHC 33.0 08/30/2020 1050   RDW 12.4 08/30/2020 1050   RDW 13.3 06/29/2017 1224   LYMPHSABS 1.9 08/30/2020  1050   LYMPHSABS 1.7 06/29/2017 1224   MONOABS 0.6 08/30/2020 1050   EOSABS 0.0 08/30/2020 1050   EOSABS 0.2 06/29/2017 1224   BASOSABS 0.1 08/30/2020 1050   BASOSABS 0.0 06/29/2017 1224    No results found for: POCLITH, LITHIUM   No results found for: PHENYTOIN, PHENOBARB, VALPROATE, CBMZ   .res Assessment: Plan:    Plan:  PDMP reviewed  Current medications:  1. Lorazepam 1mg  TID - will send in a 2 week supply with a refill. 2. Seroquel 25mg  - 2 at bedtime - taking one at bedtime. 3. Buspar 10mg  BID - taking one daily  Seeing therapist - .  RTC 4 weeks  Discussed restarting stimulant - will not add stimulant in at this time. Discussed recent psychosis and hospitalization and possible recurrence of psychosis.   Patient advised to contact office with any questions, adverse effects, or acute worsening in signs and symptoms.  Discussed potential metabolic side effects associated with atypical antipsychotics, as well as potential risk for movement side effects. Advised pt to contact office if movement side effects occur.   Discussed potential benefits, risk, and side effects of benzodiazepines to include potential risk of tolerance and dependence, as well as possible drowsiness.  Advised patient not to drive if experiencing drowsiness and to take lowest possible effective dose to minimize risk of dependence and tolerance.  Diagnoses and all orders for this visit:  Attention deficit hyperactivity disorder (ADHD), unspecified ADHD type  Major depressive disorder, recurrent episode, moderate (HCC)  Generalized anxiety disorder  Obsessional thoughts    Please see After Visit Summary for patient specific instructions.  Future Appointments  Date Time Provider Department Center  07/16/2021  3:00 PM [facility] None  07/16/2021  3:30 PM Elio Forget, MD [facility] None  07/23/2021 10:00 AM 13/09/2020, [facility] None  08/01/2021   8:00 AM Waldron Session, [facility] None  08/13/2021  9:00 AM Waldron Session, [facility] CP[facility] None  08/20/2021  9:00 AM Waldron Session, [facility] None  08/27/2021  9:00 AM Waldron Session, [facility] CP[facility] None  09/24/2021  9:00 AM Arnette Felts, FNP [facility] None    No orders of the defined types were placed in this encounter.     -------------------------------

## 2021-07-10 ENCOUNTER — Other Ambulatory Visit: Payer: Self-pay | Admitting: Nurse Practitioner

## 2021-07-10 DIAGNOSIS — J9801 Acute bronchospasm: Secondary | ICD-10-CM

## 2021-07-16 ENCOUNTER — Inpatient Hospital Stay: Payer: BC Managed Care – PPO | Admitting: Oncology

## 2021-07-16 ENCOUNTER — Inpatient Hospital Stay: Payer: BC Managed Care – PPO | Attending: Oncology

## 2021-07-16 ENCOUNTER — Other Ambulatory Visit: Payer: Self-pay | Admitting: Adult Health

## 2021-07-23 ENCOUNTER — Ambulatory Visit: Payer: BC Managed Care – PPO | Admitting: Mental Health

## 2021-07-30 ENCOUNTER — Encounter: Payer: BC Managed Care – PPO | Admitting: Nurse Practitioner

## 2021-07-30 NOTE — Patient Instructions (Signed)
Depression Screening °Depression screening is a tool that your health care provider can use to learn if you have symptoms of depression. Depression is a common condition with many symptoms that are also often found in other conditions. Depression is treatable, but it must first be diagnosed. You may not know that certain feelings, thoughts, and behaviors that you are having can be symptoms of depression. °Taking a depression screening test can help you and your health care provider decide if you need more assessment, or if you should be referred to a mental health care provider. °What are the screening tests? °You may have a physical exam to see if another condition is affecting your mental health. You may have a blood or urine sample taken during the physical exam. °You may be interviewed or offered a written test using a screening tool that was developed from research, such as one of these: °Patient Health Questionnaire (PHQ). This is a set of either 2 or 9 questions. A health care provider who has been trained to score this screening test uses a guide to assess if your symptoms suggest that you may have depression. °Blakleigh Straw Depression Rating Scale (HAM-D). This is a set of either 17 or 24 questions. You may be asked to take it again during or after your treatment, to see if your depression has gotten better. °Beck Depression Inventory (BDI). This is a set of 21 multiple choice questions. Your health care provider scores your answers to assess: °Your level of depression, ranging from mild to severe. °Your response to treatment. °Your health care provider may talk with you about your daily activities, such as eating, sleeping, work, and recreation, and ask if you have had any changes in activity. °Your health care provider may ask you to see a mental health specialist, such as a psychiatrist or psychologist, for more evaluation. °Who should be screened for depression? ° °All adults, including adults with a family  history of a mental health disorder. °People who are 10-21 years old. °People who are recovering from an acute condition, such as myocardial infarction (MI) or stroke. °Pregnant women, or women who have given birth. °People who have a long-term (chronic) illness. °Anyone who has been diagnosed with another type of mental health disorder. °Anyone who has symptoms that could show depression. °What do my results mean? °Your health care provider will review the results of your depression screening, physical exam, and lab tests. Positive screens suggest that you may have depression. Screening is the first step in getting the care that you may need. It will be important for you to know the results of your tests. °Ask your health care provider, or the department that is doing your screening tests, when your results will be ready. °Talk with your health care provider about your results, diagnosis, and recommendations for follow-up. °A diagnosis of depression is made using information from the Diagnostic and Statistical Manual of Mental Disorders (DSM-5). This is a book that lists the number and type of symptoms that must be present for a health care provider to give a specific diagnosis. °Your health care provider may work with you to treat your symptoms of depression, or your health care provider may help you find a mental health provider who can assess and help you develop a plan to treat your depression. °Get help right away if: °You have thoughts about hurting yourself or others. °If you ever feel like you may hurt yourself or others, or have thoughts about taking your own life,   get help right away. Go to your nearest emergency department or: °Call your local emergency services (911 in the [place] °Call a suicide crisis helpline, such as the National Suicide Prevention Lifeline at 1-800-273-8255 or 988 in the [place] This is open 24 hours a day in the [place] °Text the Crisis Text Line at 741741 (in the  [place] °Summary °Depression screening is the first step in getting the help that you may need. °If your screening test shows symptoms of depression (is positive), your health care provider may ask you to see a mental health provider who will help identify ways to treat your depression. °Anyone aged 10 or older should be screened for depression. °This information is not intended to replace advice given to you by your health care provider. Make sure you discuss any questions you have with your health care provider. °Document Revised: 03/27/2021 Document Reviewed: 12/10/2020 °Elsevier Patient Education © 2022 Elsevier Inc. ° °

## 2021-07-30 NOTE — Progress Notes (Signed)
I,Khori Underberg T Shawnita Krizek,acting as a Neurosurgeon for Arnette Felts, FNP.,have documented all relevant documentation on the behalf of Arnette Felts, FNP,as directed by  Arnette Felts, FNP while in the presence of Arnette Felts, FNP.  This visit occurred during the SARS-CoV-2 public health emergency.  Safety protocols were in place, including screening questions prior to the visit, additional usage of staff PPE, and extensive cleaning of exam room while observing appropriate contact time as indicated for disinfecting solutions.  Subjective:     Patient ID: [name] [name] , female    DOB: [birth year] , 30 y.o.   MRN: [id number]   Chief Complaint  Patient presents with   Depression     HPI  Pt here for med f/u.     Past Medical History:  Diagnosis Date   ADHD (attention deficit hyperactivity disorder)    Anxiety    Panic attacks    Serotonin syndrome    Vaginal Pap smear, abnormal      Family History  Problem Relation Age of Onset   Hypertension Mother    Heart disease Father    Cancer Father        testicular   Hypertension Father    Hyperlipidemia Father    Heart disease Paternal Grandfather      Current Outpatient Medications:    albuterol (VENTOLIN HFA) 108 (90 Base) MCG/ACT inhaler, INHALE 1 TO 2 PUFFS INTO THE LUNGS EVERY 6 HOURS AS NEEDED FOR WHEEZING OR SHORTNESS OF BREATH, Disp: 6.7 g, Rfl: 2   Alcohol Swabs PADS, Use as directed with mounjaro, Disp: 100 each, Rfl: 1   busPIRone (BUSPAR) 10 MG tablet, Take one tablet daily., Disp: 30 tablet, Rfl: 2   doxycycline (VIBRA-TABS) 100 MG tablet, Take 1 tablet (100 mg total) by mouth 2 (two) times daily., Disp: 20 tablet, Rfl: 0   fluticasone (FLONASE) 50 MCG/ACT nasal spray, SHAKE LIQUID AND USE 1 SPRAY IN EACH NOSTRIL TWICE DAILY, Disp: 16 g, Rfl: 2   hydrochlorothiazide (HYDRODIURIL) 12.5 MG tablet, Take 1 tablet (12.5 mg total) by mouth daily as needed., Disp: 30 tablet, Rfl: 2   hydrOXYzine (ATARAX/VISTARIL) 10 MG tablet, TAKE 1  TABLET(10 MG) BY MOUTH THREE TIMES DAILY AS NEEDED, Disp: 90 tablet, Rfl: 0   LORazepam (ATIVAN) 1 MG tablet, Take one tablet three times daily as needed for anxiety., Disp: 45 tablet, Rfl: 3   mupirocin ointment (BACTROBAN) 2 %, Apply 1 application topically 2 (two) times daily., Disp: 22 g, Rfl: 0   QUEtiapine (SEROQUEL) 25 MG tablet, TAKE 1 TABLET(25 MG) BY MOUTH TWICE DAILY, Disp: 60 tablet, Rfl: 2   tirzepatide (MOUNJARO) 5 MG/0.5ML Pen, Inject 5 mg into the skin once a week., Disp: 2 mL, Rfl: 2   Allergies  Allergen Reactions   Ceclor [Cefaclor] Hives    Age 30 mild rash Tolerates ceftriaxone   Ondansetron     Per mother. Patient has serotonin syndrome and can not have Zofran.      Review of Systems  Constitutional: Negative.   Respiratory: Negative.    Cardiovascular: Negative.   Neurological: Negative.   Psychiatric/Behavioral: Negative.      There were no vitals filed for this visit. There is no height or weight on file to calculate BMI.   Objective:  Physical Exam      Assessment And Plan:     1. Anxiety    Patient was given opportunity to ask questions. Patient verbalized understanding of the plan and was able to repeat key  elements of the plan. All questions were answered to their satisfaction.  Coolidge Breeze, CMA   I, Coolidge Breeze, CMA, have reviewed all documentation for this visit. The documentation on 07/30/21 for the exam, diagnosis, procedures, and orders are all accurate and complete.   IF YOU HAVE BEEN REFERRED TO A SPECIALIST, IT MAY TAKE 1-2 WEEKS TO SCHEDULE/PROCESS THE REFERRAL. IF YOU HAVE NOT HEARD FROM US/SPECIALIST IN TWO WEEKS, PLEASE GIVE [place] A CALL AT [phone] X [phone].   THE PATIENT IS ENCOURAGED TO PRACTICE SOCIAL DISTANCING DUE TO THE COVID-19 PANDEMIC.

## 2021-08-01 ENCOUNTER — Ambulatory Visit (INDEPENDENT_AMBULATORY_CARE_PROVIDER_SITE_OTHER): Payer: BC Managed Care – PPO | Admitting: Mental Health

## 2021-08-01 ENCOUNTER — Other Ambulatory Visit: Payer: Self-pay

## 2021-08-01 DIAGNOSIS — F909 Attention-deficit hyperactivity disorder, unspecified type: Secondary | ICD-10-CM | POA: Diagnosis not present
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
## 2021-08-13 ENCOUNTER — Ambulatory Visit: Payer: BC Managed Care – PPO | Admitting: Mental Health

## 2021-08-15 ENCOUNTER — Telehealth (INDEPENDENT_AMBULATORY_CARE_PROVIDER_SITE_OTHER): Payer: BC Managed Care – PPO | Admitting: Adult Health

## 2021-08-15 ENCOUNTER — Encounter: Payer: Self-pay | Admitting: Adult Health

## 2021-08-15 ENCOUNTER — Other Ambulatory Visit: Payer: Self-pay

## 2021-08-15 DIAGNOSIS — F428 Other obsessive-compulsive disorder: Secondary | ICD-10-CM

## 2021-08-15 DIAGNOSIS — F909 Attention-deficit hyperactivity disorder, unspecified type: Secondary | ICD-10-CM | POA: Diagnosis not present

## 2021-08-15 DIAGNOSIS — F331 Major depressive disorder, recurrent, moderate: Secondary | ICD-10-CM

## 2021-08-15 DIAGNOSIS — F411 Generalized anxiety disorder: Secondary | ICD-10-CM | POA: Diagnosis not present

## 2021-08-15 MED ORDER — QUETIAPINE FUMARATE 25 MG PO TABS: ORAL_TABLET | ORAL | 2 refills | Status: DC

## 2021-08-15 MED ORDER — BUSPIRONE HCL 10 MG PO TABS: ORAL_TABLET | ORAL | 2 refills | Status: DC

## 2021-08-15 MED ORDER — LORAZEPAM 1 MG PO TABS: ORAL_TABLET | ORAL | 3 refills | Status: DC
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
## 2021-08-20 ENCOUNTER — Ambulatory Visit (INDEPENDENT_AMBULATORY_CARE_PROVIDER_SITE_OTHER): Payer: BC Managed Care – PPO | Admitting: Mental Health

## 2021-08-20 ENCOUNTER — Other Ambulatory Visit: Payer: Self-pay

## 2021-08-20 DIAGNOSIS — F411 Generalized anxiety disorder: Secondary | ICD-10-CM | POA: Diagnosis not present
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
## 2021-08-22 ENCOUNTER — Other Ambulatory Visit: Payer: Self-pay | Admitting: Adult Health

## 2021-08-27 ENCOUNTER — Ambulatory Visit: Payer: BC Managed Care – PPO | Admitting: Mental Health

## 2021-09-03 ENCOUNTER — Ambulatory Visit: Payer: BC Managed Care – PPO | Admitting: Adult Health

## 2021-09-03 NOTE — Progress Notes (Signed)
Patient no show appointment. ? ?

## 2021-09-11 ENCOUNTER — Other Ambulatory Visit: Payer: Self-pay | Admitting: Nurse Practitioner

## 2021-09-11 ENCOUNTER — Encounter: Payer: Self-pay | Admitting: Nurse Practitioner

## 2021-09-11 DIAGNOSIS — E8881 Metabolic syndrome: Secondary | ICD-10-CM

## 2021-09-11 MED ORDER — MOUNJARO 7.5 MG/0.5ML ~~LOC~~ SOAJ: 7.5000 mg | SUBCUTANEOUS | 0 refills | Status: DC

## 2021-09-18 ENCOUNTER — Other Ambulatory Visit: Payer: Self-pay | Admitting: Nurse Practitioner

## 2021-09-18 DIAGNOSIS — R0981 Nasal congestion: Secondary | ICD-10-CM

## 2021-09-19 ENCOUNTER — Ambulatory Visit (INDEPENDENT_AMBULATORY_CARE_PROVIDER_SITE_OTHER): Payer: BC Managed Care – PPO | Admitting: Adult Health

## 2021-09-19 ENCOUNTER — Other Ambulatory Visit: Payer: Self-pay

## 2021-09-19 ENCOUNTER — Encounter: Payer: Self-pay | Admitting: Adult Health

## 2021-09-19 DIAGNOSIS — F428 Other obsessive-compulsive disorder: Secondary | ICD-10-CM

## 2021-09-19 DIAGNOSIS — F331 Major depressive disorder, recurrent, moderate: Secondary | ICD-10-CM | POA: Diagnosis not present

## 2021-09-19 DIAGNOSIS — F909 Attention-deficit hyperactivity disorder, unspecified type: Secondary | ICD-10-CM

## 2021-09-19 DIAGNOSIS — F411 Generalized anxiety disorder: Secondary | ICD-10-CM | POA: Diagnosis not present

## 2021-09-19 MED ORDER — BUSPIRONE HCL 10 MG PO TABS: ORAL_TABLET | ORAL | 2 refills | Status: DC

## 2021-09-19 MED ORDER — LORAZEPAM 1 MG PO TABS: ORAL_TABLET | ORAL | 5 refills | Status: DC

## 2021-09-19 MED ORDER — QUETIAPINE FUMARATE 25 MG PO TABS: ORAL_TABLET | ORAL | 2 refills | Status: DC

## 2021-09-19 NOTE — Progress Notes (Signed)
[name] [name] [id number] April 09, 1991 31 y.o.  Subjective:   Patient ID:  [name] [name] is a 31 y.o. (DOB 1991/02/10) female.  Chief Complaint: No chief complaint on file.   HPI [name] [name] presents to the office today for follow-up of MDD, GAD, obsessional thoughts, and ADHD.  Describes mood today as "ok". Pleasant. Tearful at times. Mood symptoms - denies depression. Reports some anxiety and irritability. Reports worry and rumination. Reports a few panic attacks. Increased stressors this week - daughter sick. Enjoyed the holidays. Seeing Elio Forget - therapist. Improved interest and motivation Energy levels improved overall. Active, does not have a regular exercise routine. Walking daily. Enjoys some usual interests and activities. Married, but separated. Lives with 2 daughters. Family local and supportive.  Appetite adequate. Weight loss - 10 pounds - 200 pounds.  Sleeps well most nights. Averages 9 to 10 hours.  Focus and concentration improved - I'm in a way better place". Completing tasks. Managing some aspects of household. Unemployed. Denies SI or HI.  Denies AH or VH.  Previous medication trials: Adderall, Clonazepam, Lorazepam, Trazadone, Lexapro, Paxil, Zoloft.   PHQ2-9    Flowsheet Row Office Visit from 03/26/2021 in [facility] Video Visit from 01/03/2020 in Primary Care at [facility] Visit from 05/16/2019 in Primary Care at [facility] Telemedicine from 01/14/2019 in Primary Care at [facility] Visit from 08/06/2018 in Primary Care at [facility] Total Score 0 0 0 0 0        Review of Systems:  Review of Systems  Musculoskeletal:  Negative for gait problem.  Neurological:  Negative for tremors.  Psychiatric/Behavioral:         Please refer to HPI   Medications: I have reviewed the patient's current medications.  Current Outpatient Medications  Medication Sig Dispense Refill   albuterol (VENTOLIN HFA) 108 (90 Base) MCG/ACT inhaler  INHALE 1 TO 2 PUFFS INTO THE LUNGS EVERY 6 HOURS AS NEEDED FOR WHEEZING OR SHORTNESS OF BREATH 6.7 g 2   Alcohol Swabs PADS Use as directed with mounjaro 100 each 1   busPIRone (BUSPAR) 10 MG tablet Take one tablet twice daily. 60 tablet 2   doxycycline (VIBRA-TABS) 100 MG tablet Take 1 tablet (100 mg total) by mouth 2 (two) times daily. 20 tablet 0   fluticasone (FLONASE) 50 MCG/ACT nasal spray SHAKE LIQUID AND USE 1 SPRAY IN EACH NOSTRIL TWICE DAILY 16 g 2   hydrochlorothiazide (HYDRODIURIL) 12.5 MG tablet Take 1 tablet (12.5 mg total) by mouth daily as needed. 30 tablet 2   LORazepam (ATIVAN) 1 MG tablet Take one tablet three times daily as needed for anxiety. 45 tablet 3   mupirocin ointment (BACTROBAN) 2 % Apply 1 application topically 2 (two) times daily. 22 g 0   QUEtiapine (SEROQUEL) 25 MG tablet TAKE 1 TABLET(25 MG) BY MOUTH TWICE DAILY 60 tablet 2   tirzepatide (MOUNJARO) 7.5 MG/0.5ML Pen Inject 7.5 mg into the skin once a week. 2 mL 0   No current facility-administered medications for this visit.    Medication Side Effects: None  Allergies:  Allergies  Allergen Reactions   Ceclor [Cefaclor] Hives    Age 26 mild rash Tolerates ceftriaxone   Ondansetron     Per mother. Patient has serotonin syndrome and can not have Zofran.     Past Medical History:  Diagnosis Date   ADHD (attention deficit hyperactivity disorder)    Anxiety    Panic attacks    Serotonin syndrome  Vaginal Pap smear, abnormal     Past Medical History, Surgical history, Social history, and Family history were reviewed and updated as appropriate.   Please see review of systems for further details on the patient's review from today.   Objective:   Physical Exam:  There were no vitals taken for this visit.  Physical Exam Constitutional:      General: She is not in acute distress. Musculoskeletal:        General: No deformity.  Neurological:     Mental Status: She is alert and oriented to person,  place, and time.     Coordination: Coordination normal.  Psychiatric:        Attention and Perception: Attention and perception normal. She does not perceive auditory or visual hallucinations.        Mood and Affect: Mood normal. Mood is not anxious or depressed. Affect is not labile, blunt, angry or inappropriate.        Speech: Speech normal.        Behavior: Behavior normal.        Thought Content: Thought content normal. Thought content is not paranoid or delusional. Thought content does not include homicidal or suicidal ideation. Thought content does not include homicidal or suicidal plan.        Cognition and Memory: Cognition and memory normal.        Judgment: Judgment normal.     Comments: Insight intact    Lab Review:     Component Value Date/Time   NA 143 04/04/2021 1236   K 4.4 04/04/2021 1236   CL 103 04/04/2021 1236   CO2 27 04/04/2021 1236   GLUCOSE 101 (H) 04/04/2021 1236   GLUCOSE 140 (H) 08/30/2020 1050   BUN 18 04/04/2021 1236   CREATININE 0.86 04/04/2021 1236   CALCIUM 10.0 04/04/2021 1236   PROT 7.2 04/04/2021 1236   ALBUMIN 4.4 04/04/2021 1236   AST 16 04/04/2021 1236   ALT 21 04/04/2021 1236   ALKPHOS 119 04/04/2021 1236   BILITOT 0.3 04/04/2021 1236   GFRNONAA >60 08/30/2020 1050   GFRAA 133 06/29/2017 1224       Component Value Date/Time   WBC 11.7 (H) 08/30/2020 1050   RBC 4.90 08/30/2020 1050   HGB 14.5 08/30/2020 1050   HGB 13.7 06/29/2017 1224   HGB 12.7 06/06/2015 0000   HCT 43.9 08/30/2020 1050   HCT 41.0 06/29/2017 1224   HCT 37 06/06/2015 0000   PLT 480 (H) 08/30/2020 1050   PLT 312 06/29/2017 1224   PLT 300 06/06/2015 0000   MCV 89.6 08/30/2020 1050   MCV 92.5 02/16/2018 1224   MCV 93 06/29/2017 1224   MCH 29.6 08/30/2020 1050   MCHC 33.0 08/30/2020 1050   RDW 12.4 08/30/2020 1050   RDW 13.3 06/29/2017 1224   LYMPHSABS 1.9 08/30/2020 1050   LYMPHSABS 1.7 06/29/2017 1224   MONOABS 0.6 08/30/2020 1050   EOSABS 0.0 08/30/2020  1050   EOSABS 0.2 06/29/2017 1224   BASOSABS 0.1 08/30/2020 1050   BASOSABS 0.0 06/29/2017 1224    No results found for: POCLITH, LITHIUM   No results found for: PHENYTOIN, PHENOBARB, VALPROATE, CBMZ   .res Assessment: Plan:     Plan:  PDMP reviewed  Current medications:  1. Lorazepam 1mg  TID - will send in a 2 week supply with a refill. 2. Seroquel 25mg  - 2 at bedtime - taking one at bedtime. 3. Buspar 10mg  BID - taking one daily  Seeing therapist -  Elio Forget.  RTC 4 weeks  Discussed restarting stimulant - will not add stimulant in at this time. Discussed recent psychosis and hospitalization and possible recurrence of psychosis.   Patient advised to contact office with any questions, adverse effects, or acute worsening in signs and symptoms.  Discussed potential metabolic side effects associated with atypical antipsychotics, as well as potential risk for movement side effects. Advised pt to contact office if movement side effects occur.   Discussed potential benefits, risk, and side effects of benzodiazepines to include potential risk of tolerance and dependence, as well as possible drowsiness.  Advised patient not to drive if experiencing drowsiness and to take lowest possible effective dose to minimize risk of dependence and tolerance.  There are no diagnoses linked to this encounter.   Please see After Visit Summary for patient specific instructions.  Future Appointments  Date Time Provider Department Center  09/24/2021  9:00 AM Arnette Felts, FNP [facility] None  09/25/2021 11:00 AM Waldron Session, [facility] None  10/11/2021  9:00 AM Waldron Session, [facility] None  10/30/2021  9:00 AM Waldron Session, [facility] None  11/13/2021  9:00 AM Waldron Session, [facility] None  11/27/2021  9:00 AM Waldron Session, [facility] None    No orders of the defined types were placed in this encounter.   -------------------------------

## 2021-09-24 ENCOUNTER — Other Ambulatory Visit: Payer: Self-pay

## 2021-09-24 ENCOUNTER — Encounter: Payer: Self-pay | Admitting: Nurse Practitioner

## 2021-09-24 ENCOUNTER — Ambulatory Visit (INDEPENDENT_AMBULATORY_CARE_PROVIDER_SITE_OTHER): Payer: BC Managed Care – PPO | Admitting: Nurse Practitioner

## 2021-09-24 VITALS — BP 122/80 | HR 102 | Temp 97.9°F | Ht 64.8 in | Wt 196.4 lb

## 2021-09-24 DIAGNOSIS — E8881 Metabolic syndrome: Secondary | ICD-10-CM

## 2021-09-24 DIAGNOSIS — D508 Other iron deficiency anemias: Secondary | ICD-10-CM | POA: Diagnosis not present

## 2021-09-24 DIAGNOSIS — E6609 Other obesity due to excess calories: Secondary | ICD-10-CM | POA: Insufficient documentation

## 2021-09-24 DIAGNOSIS — Z6832 Body mass index (BMI) 32.0-32.9, adult: Secondary | ICD-10-CM | POA: Diagnosis not present

## 2021-09-24 DIAGNOSIS — D649 Anemia, unspecified: Secondary | ICD-10-CM | POA: Insufficient documentation

## 2021-09-24 MED ORDER — MOUNJARO 7.5 MG/0.5ML ~~LOC~~ SOAJ: 7.5000 mg | SUBCUTANEOUS | 1 refills | Status: DC

## 2021-09-24 NOTE — Progress Notes (Signed)
I,Yamilka J Llittleton,acting as a Education administrator for [employer], FNP.,have documented all relevant documentation on the behalf of [name], FNP,as directed by  [name], FNP while in the presence of [name], [place].   This visit occurred during the SARS-CoV-2 public health emergency.  Safety protocols were in place, including screening questions prior to the visit, additional usage of staff PPE, and extensive cleaning of exam room while observing appropriate contact time as indicated for disinfecting solutions.  Subjective:     Patient ID: [name] [name] , female    DOB: 12/05/1990 , 31 y.o.   MRN: [id number]   Chief Complaint  Patient presents with   Weight Check    HPI  The patient is here today for a follow-up on her Monjouro. She is taking her iron supplement at night. She is doing well with the Monjouro. She notices when she eats taco [employer] she will have heart burn.  Her water intake is improved - drinking hydration and drinking 2-3 cups a day.  She has been taking the diuretic as needed. Continues to see Rollene Fare at [place]. She is taking a new medication for anxiety this has improved. She is sticking to her schedules.   Wt Readings from Last 3 Encounters: 09/24/21 : 196 lb 6.4 oz (89.1 kg) 06/18/21 : 230 lb 3.2 oz (104.4 kg) 04/09/21 : 245 lb (111.1 kg)      Past Medical History:  Diagnosis Date   ADHD (attention deficit hyperactivity disorder)    Anxiety    Panic attacks    Serotonin syndrome    Vaginal Pap smear, abnormal      Family History  Problem Relation Age of Onset   Hypertension Mother    Heart disease Father    Cancer Father        testicular   Hypertension Father    Hyperlipidemia Father    Heart disease Paternal Grandfather      Current Outpatient Medications:    albuterol (VENTOLIN HFA) 108 (90 Base) MCG/ACT inhaler, INHALE 1 TO 2 PUFFS INTO THE LUNGS EVERY 6 HOURS AS NEEDED FOR WHEEZING OR SHORTNESS OF BREATH, Disp: 6.7 g, Rfl: 2   Alcohol  Swabs PADS, Use as directed with mounjaro, Disp: 100 each, Rfl: 1   busPIRone (BUSPAR) 10 MG tablet, Take one tablet twice daily., Disp: 180 tablet, Rfl: 2   doxycycline (VIBRA-TABS) 100 MG tablet, Take 1 tablet (100 mg total) by mouth 2 (two) times daily., Disp: 20 tablet, Rfl: 0   fluticasone (FLONASE) 50 MCG/ACT nasal spray, SHAKE LIQUID AND USE 1 SPRAY IN EACH NOSTRIL TWICE DAILY, Disp: 16 g, Rfl: 2   hydrochlorothiazide (HYDRODIURIL) 12.5 MG tablet, Take 1 tablet (12.5 mg total) by mouth daily as needed., Disp: 30 tablet, Rfl: 2   LORazepam (ATIVAN) 1 MG tablet, Take one tablet three times daily as needed for anxiety., Disp: 45 tablet, Rfl: 5   mupirocin ointment (BACTROBAN) 2 %, Apply 1 application topically 2 (two) times daily., Disp: 22 g, Rfl: 0   QUEtiapine (SEROQUEL) 25 MG tablet, TAKE 1 TABLET(25 MG) BY MOUTH TWICE DAILY, Disp: 180 tablet, Rfl: 2   tirzepatide (MOUNJARO) 7.5 MG/0.5ML Pen, Inject 7.5 mg into the skin once a week., Disp: 6 mL, Rfl: 1   Allergies  Allergen Reactions   Ceclor [Cefaclor] Hives    Age 9 mild rash Tolerates ceftriaxone   Ondansetron     Per mother. Patient has serotonin syndrome and can not have Zofran.  Review of Systems  Constitutional: Negative.   Eyes: Negative.   Respiratory: Negative.    Cardiovascular: Negative.  Negative for chest pain, palpitations and leg swelling.  Gastrointestinal: Negative.   Musculoskeletal: Negative.   Skin: Negative.   Neurological:  Negative for dizziness and headaches.  Psychiatric/Behavioral: Negative.    All other systems reviewed and are negative.   Today's Vitals   09/24/21 0851  BP: 122/80  Pulse: (!) 102  Temp: 97.9 F (36.6 C)  Weight: 196 lb 6.4 oz (89.1 kg)  Height: 5' 4.8" (1.646 m)  PainSc: 0-No pain   Body mass index is 32.88 kg/m.   Objective:  Physical Exam Constitutional:      General: She is not in acute distress.    Appearance: Normal appearance.  Cardiovascular:     Rate  and Rhythm: Normal rate and regular rhythm.     Pulses: Normal pulses.     Heart sounds: Normal heart sounds. No murmur heard. Pulmonary:     Effort: Pulmonary effort is normal. No respiratory distress.     Breath sounds: No wheezing.  Neurological:     General: No focal deficit present.     Mental Status: She is alert.     Cranial Nerves: No cranial nerve deficit.     Motor: No weakness.  Psychiatric:        Mood and Affect: Mood normal.        Behavior: Behavior normal.        Thought Content: Thought content normal.        Judgment: Judgment normal.        Assessment And Plan:     1. Insulin resistance Comments: Feels like she is eating better, tolerating Monjoura well.  - Insulin, random - BMP8+eGFR - tirzepatide (MOUNJARO) 7.5 MG/0.5ML Pen; Inject 7.5 mg into the skin once a week.  Dispense: 6 mL; Refill: 1  2. Other iron deficiency anemia Comments: Continue iron supplement, tolerating well.  - Iron, TIBC and Ferritin Panel - BMP8+eGFR  3. Class 1 obesity due to excess calories with body mass index (BMI) of 32.0 to 32.9 in adult, unspecified whether serious comorbidity present Chronic Discussed healthy diet and regular exercise options  Encouraged to exercise at least 150 minutes per week with 2 days of strength training. Congratulations on your 49 lb weight loss, I do strongly encouraged to increase her physical activity.     Patient was given opportunity to ask questions. Patient verbalized understanding of the plan and was able to repeat key elements of the plan. All questions were answered to their satisfaction.  [name], FNP   I, [name], FNP, have reviewed all documentation for this visit. The documentation on 09/24/21 for the exam, diagnosis, procedures, and orders are all accurate and complete.   IF YOU HAVE BEEN REFERRED TO A SPECIALIST, IT MAY TAKE 1-2 WEEKS TO SCHEDULE/PROCESS THE REFERRAL. IF YOU HAVE NOT HEARD FROM US/SPECIALIST IN TWO WEEKS,  PLEASE GIVE [place] A CALL AT [phone] X [phone].   THE PATIENT IS ENCOURAGED TO PRACTICE SOCIAL DISTANCING DUE TO THE COVID-19 PANDEMIC.

## 2021-09-24 NOTE — Patient Instructions (Addendum)

## 2021-09-25 ENCOUNTER — Ambulatory Visit (INDEPENDENT_AMBULATORY_CARE_PROVIDER_SITE_OTHER): Payer: BC Managed Care – PPO | Admitting: Mental Health

## 2021-09-25 DIAGNOSIS — F411 Generalized anxiety disorder: Secondary | ICD-10-CM

## 2021-09-25 LAB — BMP8+EGFR
BUN/Creatinine Ratio: 13 (ref 9–23)
BUN: 10 mg/dL (ref 6–20)
CO2: 27 mmol/L (ref 20–29)
Calcium: 9.8 mg/dL (ref 8.7–10.2)
Chloride: 101 mmol/L (ref 96–106)
Creatinine, Ser: 0.79 mg/dL (ref 0.57–1.00)
Glucose: 75 mg/dL (ref 70–99)
Potassium: 4.6 mmol/L (ref 3.5–5.2)
Sodium: 141 mmol/L (ref 134–144)
eGFR: 102 mL/min/{1.73_m2} (ref >59)

## 2021-09-25 LAB — IRON,TIBC AND FERRITIN PANEL
Ferritin: 122 ng/mL (ref 15–150)
Iron Saturation: 16 % (ref 15–55)
Iron: 57 ug/dL (ref 27–159)
Total Iron Binding Capacity: 361 ug/dL (ref 250–450)
UIBC: 304 ug/dL (ref 131–425)

## 2021-09-25 LAB — INSULIN, RANDOM: INSULIN: 17.3 u[IU]/mL (ref 2.6–24.9)

## 2021-09-25 NOTE — Progress Notes (Signed)
[facility]  Name: KEAUNA BRASEL Date: 09/25/2021 MRN: [id number] DOB: [birth year] PCP: Arnette Felts, FNP  Time spent: 56 minutes  Treatment:  Individual therapy   Mental Status Exam:    Appearance:    Casual     Behavior:   Appropriate  Motor:   WNL  Speech/Language:    Clear and Coherent  Affect:   Full range  Mood:   Depressed, anxious  Thought process:   normal  Thought content:     WNL  Sensory/Perceptual disturbances:     none  Orientation:   x4  Attention:   Good  Concentration:   Good  Memory:   Intact  Fund of knowledge:    Consistent with age and development  Insight:     developing  Judgment:    Good  Impulse Control:   Good    Reported Symptoms:  Appetite disturbance, sleep disturbance, anxiety (8/10), depression (8/10), low motivation, isolative  Risk Assessment: Danger to Self:  No Self-injurious Behavior: No Danger to Others: No Duty to Warn:no Physical Aggression / Violence:No  Access to Firearms a concern: No  Gang Involvement:No  Patient / guardian was educated about steps to take if suicide or homicide risk level increases between visits:   While future psychiatric events cannot be accurately predicted, the patient does not currently require acute inpatient psychiatric care and does not currently meet [facility] involuntary commitment criteria.    Medications: Current Outpatient Medications  Medication Sig Dispense Refill   albuterol (VENTOLIN HFA) 108 (90 Base) MCG/ACT inhaler INHALE 1 TO 2 PUFFS INTO THE LUNGS EVERY 6 HOURS AS NEEDED FOR WHEEZING OR SHORTNESS OF BREATH 6.7 g 2   Alcohol Swabs PADS Use as directed with mounjaro 100 each 1   busPIRone (BUSPAR) 10 MG tablet Take one tablet twice daily. 180 tablet 2   doxycycline (VIBRA-TABS) 100 MG tablet Take 1 tablet (100 mg total) by mouth 2 (two) times daily. 20 tablet 0   fluticasone (FLONASE) 50 MCG/ACT nasal spray SHAKE LIQUID AND USE 1 SPRAY IN EACH NOSTRIL TWICE  DAILY 16 g 2   hydrochlorothiazide (HYDRODIURIL) 12.5 MG tablet Take 1 tablet (12.5 mg total) by mouth daily as needed. 30 tablet 2   LORazepam (ATIVAN) 1 MG tablet Take one tablet three times daily as needed for anxiety. 45 tablet 5   mupirocin ointment (BACTROBAN) 2 % Apply 1 application topically 2 (two) times daily. 22 g 0   QUEtiapine (SEROQUEL) 25 MG tablet TAKE 1 TABLET(25 MG) BY MOUTH TWICE DAILY 180 tablet 2   tirzepatide (MOUNJARO) 7.5 MG/0.5ML Pen Inject 7.5 mg into the skin once a week. 6 mL 1   No current facility-administered medications for this visit.    Subjective:  Patient presents for session on time.  Patient presents for session in some distress.  She stated she has been having ongoing relationship issues with her husband.  She stated he often will stay up late into the evening hours and early morning hours working.  She stated that she feels that she does not have enough independent time as he is often at home, may go to work for a few hours but even while at home, she stated that he is usually in the same room and even if she leaves, he often will follow.  She had difficulty identifying specifically reasons for the behavior other than to identify how she feels as a result.  She stated they have had increased arguments, at times he  will put her down or get easily upset and agitated over random issues that may arise day today.  As an example she stated that he may get upset if she asked for him to turn off the lights late at night and into the early morning hours as she wakes up where he may respond highly agitated and blaming her for making this request.  She stated that he has been overbearing, but this has been ongoing for the last few years and feels it is too overwhelming and she is to a point where she needs time apart.  She stated that she asked him to leave for a week or 2, however, she stated he refused to do so.  She stated she plans to possibly speak to a divorce attorney.   We discussed they are potentially benefiting from couples counseling.  Patient identified the need to have decreased issues in the relationship while she is making her decision about the future of their marriage and ways to communicate effectively were identified. It should be noted that patient also informed that her husband had engaged and an initial assessment last week with therapist.  Being unaware of this, informed patient that it would be a conflict of interest to work with both her and her husband in individual therapy and that we would continue her individual therapy as she is an existing client.  Patient expressed acknowledgment.   Interventions: Further assessment, CBT, supportive therapy, solution focused  Diagnoses:    ICD-10-CM   1. Generalized anxiety disorder  F41.1         Plan: Patient is to utilize coping skills as discussed in session, utilize support system, take steps to decrease isolated behaviors.  Patient to work on her communication in her marital relationship toward advocating for her needs, specifically her having identified personal downtime for herself.  Long-term goal:   Reduce overall level, frequency, and intensity of the feelings of depression and anxiety 8-9/10 to a 0-2/10 in severity for at least 3 consecutive months per patient report   Short-term goal:  Increase ability to relax by identifying activities, such as spending time with friends. Increase functioning daily evidenced by such tasks as making lunch for her kids, cleaning when needed, achieving at least 3 tasks per/day Maintain healthy sleep hygiene, getting adequate rest Improve coping skills as identified in session to decrease panic attacks, depression symptoms. Comply with court requirements to decrease stress and anxiety     Assessment of progress:  progressing   Waldron Session, [facility]

## 2021-10-01 ENCOUNTER — Other Ambulatory Visit: Payer: Self-pay | Admitting: Nurse Practitioner

## 2021-10-11 ENCOUNTER — Ambulatory Visit (INDEPENDENT_AMBULATORY_CARE_PROVIDER_SITE_OTHER): Payer: BC Managed Care – PPO | Admitting: Mental Health

## 2021-10-11 ENCOUNTER — Other Ambulatory Visit: Payer: Self-pay

## 2021-10-11 DIAGNOSIS — F411 Generalized anxiety disorder: Secondary | ICD-10-CM | POA: Diagnosis not present

## 2021-10-11 NOTE — Progress Notes (Signed)
[facility]  Name: [name] [name] Date: 10/11/2021 MRN: [id number] DOB: 10-11-1990 PCP: Arnette Felts, FNP  Time spent: 50 minutes  Treatment:  Individual therapy   Mental Status Exam:    Appearance:    Casual     Behavior:   Appropriate  Motor:   WNL  Speech/Language:    Clear and Coherent  Affect:   Full range  Mood:   Depressed, anxious  Thought process:   normal  Thought content:     WNL  Sensory/Perceptual disturbances:     none  Orientation:   x4  Attention:   Good  Concentration:   Good  Memory:   Intact  Fund of knowledge:    Consistent with age and development  Insight:     developing  Judgment:    Good  Impulse Control:   Good    Reported Symptoms:  Appetite disturbance, sleep disturbance, anxiety (8/10), depression (8/10), low motivation, isolative  Risk Assessment: Danger to Self:  No Self-injurious Behavior: No Danger to Others: No Duty to Warn:no Physical Aggression / Violence:No  Access to Firearms a concern: No  Gang Involvement:No  Patient / guardian was educated about steps to take if suicide or homicide risk level increases between visits:   While future psychiatric events cannot be accurately predicted, the patient does not currently require acute inpatient psychiatric care and does not currently meet [facility] involuntary commitment criteria.    Medications: Current Outpatient Medications  Medication Sig Dispense Refill   albuterol (VENTOLIN HFA) 108 (90 Base) MCG/ACT inhaler INHALE 1 TO 2 PUFFS INTO THE LUNGS EVERY 6 HOURS AS NEEDED FOR WHEEZING OR SHORTNESS OF BREATH 6.7 g 2   Alcohol Swabs PADS Use as directed with mounjaro 100 each 1   busPIRone (BUSPAR) 10 MG tablet Take one tablet twice daily. 180 tablet 2   doxycycline (VIBRA-TABS) 100 MG tablet Take 1 tablet (100 mg total) by mouth 2 (two) times daily. 20 tablet 0   fluticasone (FLONASE) 50 MCG/ACT nasal spray SHAKE LIQUID AND USE 1 SPRAY IN EACH NOSTRIL TWICE  DAILY 16 g 2   hydrochlorothiazide (HYDRODIURIL) 12.5 MG tablet Take 1 tablet (12.5 mg total) by mouth daily as needed. 30 tablet 2   LORazepam (ATIVAN) 1 MG tablet Take one tablet three times daily as needed for anxiety. 45 tablet 5   mupirocin ointment (BACTROBAN) 2 % Apply 1 application topically 2 (two) times daily. 22 g 0   QUEtiapine (SEROQUEL) 25 MG tablet TAKE 1 TABLET(25 MG) BY MOUTH TWICE DAILY 180 tablet 2   tirzepatide (MOUNJARO) 7.5 MG/0.5ML Pen Inject 7.5 mg into the skin once a week. 6 mL 1   No current facility-administered medications for this visit.    Subjective:  Patient presents for session on time.  She shared recent events, how her husband moved out of their family a few days ago.  She stated they have continued to have a strained relationship, going on to share some details.  She states she thinks he may want to stay with his parents who live locally.  She stated that he will often make hurtful comments during their arguments such as "you are a terrible mother".  Patient was highly emotional throughout the session, tearful throughout.  She feels he is manipulative and "makes everything about him".  We reviewed the potential benefit of their going to couples counseling; she shared how she is unsure at this point, plans to continue to consider.  She stated that he  has expressed wanting to work on the relationship but she continues to express uncertainty due to their having chronic problems.  Provide support throughout continue to facilitate patient identifying thoughts and feelings as well as needs throughout the session.  At this point, she is uncertain of the future of their relationship but recognizes the need to utilize her support system as she says she has some friends and family.   Interventions:  mot. Interviewing, supportive therapy  Diagnoses:    ICD-10-CM   1. Generalized anxiety disorder  F41.1          Plan: Patient is to utilize coping skills as discussed  in session, utilize support system.  Long-term goal:   Reduce overall level, frequency, and intensity of the feelings of depression and anxiety 8-9/10 to a 0-2/10 in severity for at least 3 consecutive months per patient report   Short-term goal:  Increase ability to relax by identifying activities, such as spending time with friends. Increase functioning daily evidenced by such tasks as making lunch for her kids, cleaning when needed, achieving at least 3 tasks per/day Maintain healthy sleep hygiene, getting adequate rest Improve coping skills as identified in session to decrease panic attacks, depression symptoms. Comply with court requirements to decrease stress and anxiety     Assessment of progress:  progressing   Waldron Session, [facility]

## 2021-10-14 ENCOUNTER — Other Ambulatory Visit: Payer: Self-pay

## 2021-10-14 ENCOUNTER — Ambulatory Visit: Payer: BC Managed Care – PPO | Admitting: Adult Health

## 2021-10-14 MED ORDER — WEGOVY 0.5 MG/0.5ML ~~LOC~~ SOAJ: 0.5000 mg | SUBCUTANEOUS | 1 refills | Status: DC

## 2021-10-14 NOTE — Progress Notes (Signed)
Patient no show appointment. ? ?

## 2021-10-14 NOTE — Telephone Encounter (Signed)
The patient was notified that her insurance denied coverage for the [facility] and Christell Constant, NP wants the pt to pickup a sample of Wegovy 0.25mg  from the office and a prescription for the 0.5mg  Reginal Lutes was faxed to the pt's pharmacy.

## 2021-10-16 ENCOUNTER — Encounter: Payer: Self-pay | Admitting: Adult Health

## 2021-10-16 ENCOUNTER — Telehealth (INDEPENDENT_AMBULATORY_CARE_PROVIDER_SITE_OTHER): Payer: BC Managed Care – PPO | Admitting: Adult Health

## 2021-10-16 DIAGNOSIS — F331 Major depressive disorder, recurrent, moderate: Secondary | ICD-10-CM | POA: Diagnosis not present

## 2021-10-16 DIAGNOSIS — F428 Other obsessive-compulsive disorder: Secondary | ICD-10-CM

## 2021-10-16 DIAGNOSIS — F909 Attention-deficit hyperactivity disorder, unspecified type: Secondary | ICD-10-CM | POA: Diagnosis not present

## 2021-10-16 DIAGNOSIS — F411 Generalized anxiety disorder: Secondary | ICD-10-CM | POA: Diagnosis not present

## 2021-10-16 NOTE — Progress Notes (Signed)
[name] [name] [id number] 1991-01-13 31 y.o.  Virtual Visit via Video Note  I connected with pt @ on 10/16/21 at  2:00 PM EST by a video enabled telemedicine application and verified that I am speaking with the correct person using two identifiers.   I discussed the limitations of evaluation and management by telemedicine and the availability of in person appointments. The patient expressed understanding and agreed to proceed.  I discussed the assessment and treatment plan with the patient. The patient was provided an opportunity to ask questions and all were answered. The patient agreed with the plan and demonstrated an understanding of the instructions.   The patient was advised to call back or seek an in-person evaluation if the symptoms worsen or if the condition fails to improve as anticipated.  I provided 25 minutes of non-face-to-face time during this encounter.  The patient was located at home.  The provider was located at [place].   Aloha Gell, NP   Subjective:   Patient ID:  [name] [name] is a 31 y.o. (DOB October 23, 1990) female.  Chief Complaint: No chief complaint on file.   HPI [name] [name] presents for follow-up of MDD, GAD, obsessional thoughts, and ADHD.  Describes mood today as "ok". Pleasant. Tearful at times. Mood symptoms - denies depression. Denies irritability. Increased anxiety. Reporting panic attacks. Increased situational stressors within marriage - has met with an attorney. Seeing Lanetta Inch - therapist. Improved interest and motivation. Energy levels improved. Active, exercising more. Enjoys some usual interests and activities. Married, but separated. Lives with 2 daughters. Family and friends local and supportive.  Appetite adequate. Weight loss - 245 to 180 pounds. Sleeps well most nights. Averages 6 to 7 hours.  Focus and concentration improved. Completing tasks. Managing some aspects of household. Unemployed. Denies SI or HI.   Denies AH or VH.  Previous medication trials: Adderall, Clonazepam, Lorazepam, Trazadone, Lexapro, Paxil, Zoloft.    Review of Systems:  Review of Systems  Musculoskeletal:  Negative for gait problem.  Neurological:  Negative for tremors.  Psychiatric/Behavioral:         Please refer to HPI   Medications: I have reviewed the patient's current medications.  Current Outpatient Medications  Medication Sig Dispense Refill   albuterol (VENTOLIN HFA) 108 (90 Base) MCG/ACT inhaler INHALE 1 TO 2 PUFFS INTO THE LUNGS EVERY 6 HOURS AS NEEDED FOR WHEEZING OR SHORTNESS OF BREATH 6.7 g 2   Alcohol Swabs PADS Use as directed with mounjaro 100 each 1   busPIRone (BUSPAR) 10 MG tablet Take one tablet twice daily. 180 tablet 2   doxycycline (VIBRA-TABS) 100 MG tablet Take 1 tablet (100 mg total) by mouth 2 (two) times daily. 20 tablet 0   fluticasone (FLONASE) 50 MCG/ACT nasal spray SHAKE LIQUID AND USE 1 SPRAY IN EACH NOSTRIL TWICE DAILY 16 g 2   hydrochlorothiazide (HYDRODIURIL) 12.5 MG tablet Take 1 tablet (12.5 mg total) by mouth daily as needed. 30 tablet 2   LORazepam (ATIVAN) 1 MG tablet Take one tablet three times daily as needed for anxiety. 45 tablet 5   mupirocin ointment (BACTROBAN) 2 % Apply 1 application topically 2 (two) times daily. 22 g 0   QUEtiapine (SEROQUEL) 25 MG tablet TAKE 1 TABLET(25 MG) BY MOUTH TWICE DAILY 180 tablet 2   Semaglutide-Weight Management (WEGOVY) 0.5 MG/0.5ML SOAJ Inject 0.5 mg into the skin once a week. 2 mL 1   No current facility-administered medications for this visit.    Medication  Side Effects: None  Allergies:  Allergies  Allergen Reactions   Ceclor [Cefaclor] Hives    Age 31 mild rash Tolerates ceftriaxone   Ondansetron     Per mother. Patient has serotonin syndrome and can not have Zofran.     Past Medical History:  Diagnosis Date   ADHD (attention deficit hyperactivity disorder)    Anxiety    Panic attacks    Serotonin syndrome     Vaginal Pap smear, abnormal     Family History  Problem Relation Age of Onset   Hypertension Mother    Heart disease Father    Cancer Father        testicular   Hypertension Father    Hyperlipidemia Father    Heart disease Paternal Grandfather     Social History   Socioeconomic History   Marital status: Married    Spouse name: Not on file   Number of children: Not on file   Years of education: Not on file   Highest education level: Not on file  Occupational History   Not on file  Tobacco Use   Smoking status: Former    Types: Cigarettes    Quit date: 08/15/2013    Years since quitting: 8.1   Smokeless tobacco: Never  Substance and Sexual Activity   Alcohol use: No    Alcohol/week: 2.0 standard drinks    Types: 2 Standard drinks or equivalent per week    Comment: socially, not with preg   Drug use: No   Sexual activity: Not Currently    Birth control/protection: I.U.D.  Other Topics Concern   Not on file  Social History Narrative   Not on file   Social Determinants of Health   Financial Resource Strain: Not on file  Food Insecurity: Not on file  Transportation Needs: Not on file  Physical Activity: Not on file  Stress: Not on file  Social Connections: Not on file  Intimate Partner Violence: Not on file    Past Medical History, Surgical history, Social history, and Family history were reviewed and updated as appropriate.   Please see review of systems for further details on the patient's review from today.   Objective:   Physical Exam:  There were no vitals taken for this visit.  Physical Exam Constitutional:      General: She is not in acute distress. Musculoskeletal:        General: No deformity.  Neurological:     Mental Status: She is alert and oriented to person, place, and time.     Coordination: Coordination normal.  Psychiatric:        Attention and Perception: Attention and perception normal. She does not perceive auditory or visual  hallucinations.        Mood and Affect: Mood normal. Mood is not anxious or depressed. Affect is not labile, blunt, angry or inappropriate.        Speech: Speech normal.        Behavior: Behavior normal.        Thought Content: Thought content normal. Thought content is not paranoid or delusional. Thought content does not include homicidal or suicidal ideation. Thought content does not include homicidal or suicidal plan.        Cognition and Memory: Cognition and memory normal.        Judgment: Judgment normal.     Comments: Insight intact    Lab Review:     Component Value Date/Time   NA 141 09/24/2021 0927  K 4.6 09/24/2021 0927   CL 101 09/24/2021 0927   CO2 27 09/24/2021 0927   GLUCOSE 75 09/24/2021 0927   GLUCOSE 140 (H) 08/30/2020 1050   BUN 10 09/24/2021 0927   CREATININE 0.79 09/24/2021 0927   CALCIUM 9.8 09/24/2021 0927   PROT 7.2 04/04/2021 1236   ALBUMIN 4.4 04/04/2021 1236   AST 16 04/04/2021 1236   ALT 21 04/04/2021 1236   ALKPHOS 119 04/04/2021 1236   BILITOT 0.3 04/04/2021 1236   GFRNONAA >60 08/30/2020 1050   GFRAA 133 06/29/2017 1224       Component Value Date/Time   WBC 11.7 (H) 08/30/2020 1050   RBC 4.90 08/30/2020 1050   HGB 14.5 08/30/2020 1050   HGB 13.7 06/29/2017 1224   HGB 12.7 06/06/2015 0000   HCT 43.9 08/30/2020 1050   HCT 41.0 06/29/2017 1224   HCT 37 06/06/2015 0000   PLT 480 (H) 08/30/2020 1050   PLT 312 06/29/2017 1224   PLT 300 06/06/2015 0000   MCV 89.6 08/30/2020 1050   MCV 92.5 02/16/2018 1224   MCV 93 06/29/2017 1224   MCH 29.6 08/30/2020 1050   MCHC 33.0 08/30/2020 1050   RDW 12.4 08/30/2020 1050   RDW 13.3 06/29/2017 1224   LYMPHSABS 1.9 08/30/2020 1050   LYMPHSABS 1.7 06/29/2017 1224   MONOABS 0.6 08/30/2020 1050   EOSABS 0.0 08/30/2020 1050   EOSABS 0.2 06/29/2017 1224   BASOSABS 0.1 08/30/2020 1050   BASOSABS 0.0 06/29/2017 1224    No results found for: POCLITH, LITHIUM   No results found for: PHENYTOIN,  PHENOBARB, VALPROATE, CBMZ   .res Assessment: Plan:    Plan:  PDMP reviewed  Current medications:  1. Lorazepam 47m TID - will send in a 2 week supply with a refill. 2. Seroquel 21m- 2 at bedtime - taking one at bedtime. 3. Buspar 106mID - taking one daily  Seeing therapist - ChrLanetta InchRTC 4 weeks  Discussed restarting stimulant - will not add stimulant in at this time. Discussed recent psychosis and hospitalization and possible recurrence of psychosis.   Patient advised to contact office with any questions, adverse effects, or acute worsening in signs and symptoms.  Discussed potential metabolic side effects associated with atypical antipsychotics, as well as potential risk for movement side effects. Advised pt to contact office if movement side effects occur.   Discussed potential benefits, risk, and side effects of benzodiazepines to include potential risk of tolerance and dependence, as well as possible drowsiness.  Advised patient not to drive if experiencing drowsiness and to take lowest possible effective dose to minimize risk of dependence and tolerance.  Diagnoses and all orders for this visit:  Generalized anxiety disorder  Attention deficit hyperactivity disorder (ADHD), unspecified ADHD type  Obsessional thoughts  Major depressive disorder, recurrent episode, moderate (HCCHowe   Please see After Visit Summary for patient specific instructions.  Future Appointments  Date Time Provider DepCrawford/15/2023  9:00 [facility] None  11/13/2021  9[facility]  11/27/2021  9[facility]    No orders of the defined types were placed in this encounter.     -------------------------------

## 2021-10-30 ENCOUNTER — Telehealth: Payer: Self-pay | Admitting: Adult Health

## 2021-10-30 ENCOUNTER — Ambulatory Visit: Payer: BC Managed Care – PPO | Admitting: Mental Health

## 2021-10-30 NOTE — Telephone Encounter (Signed)
Pt stated she does not think her anxiety is being managed by the ativan and Seroquel.She takes both as prescribed as well as Buspar.She does not want to increase Seroquel because she does not want to be too tired during the day.

## 2021-10-30 NOTE — Telephone Encounter (Signed)
Pt lvm that she would like to take additional medicine for her anxiety. Please call her at [phone]

## 2021-10-30 NOTE — Telephone Encounter (Signed)
Let's have her come in for an appt to discuss changes.

## 2021-10-31 NOTE — Telephone Encounter (Signed)
LVM to call back and make an appt.

## 2021-11-12 ENCOUNTER — Encounter: Payer: Self-pay | Admitting: Nurse Practitioner

## 2021-11-13 ENCOUNTER — Ambulatory Visit: Payer: BC Managed Care – PPO | Admitting: Mental Health

## 2021-11-14 ENCOUNTER — Encounter: Payer: Self-pay | Admitting: Adult Health

## 2021-11-14 ENCOUNTER — Other Ambulatory Visit: Payer: Self-pay

## 2021-11-14 ENCOUNTER — Ambulatory Visit (INDEPENDENT_AMBULATORY_CARE_PROVIDER_SITE_OTHER): Payer: BC Managed Care – PPO | Admitting: Adult Health

## 2021-11-14 DIAGNOSIS — F331 Major depressive disorder, recurrent, moderate: Secondary | ICD-10-CM

## 2021-11-14 DIAGNOSIS — F909 Attention-deficit hyperactivity disorder, unspecified type: Secondary | ICD-10-CM

## 2021-11-14 DIAGNOSIS — F411 Generalized anxiety disorder: Secondary | ICD-10-CM | POA: Diagnosis not present

## 2021-11-14 DIAGNOSIS — F428 Other obsessive-compulsive disorder: Secondary | ICD-10-CM

## 2021-11-14 MED ORDER — HYDROXYZINE HCL 10 MG PO TABS: 10.0000 mg | ORAL_TABLET | Freq: Three times a day (TID) | ORAL | 5 refills | Status: DC | PRN

## 2021-11-14 MED ORDER — LORAZEPAM 1 MG PO TABS: ORAL_TABLET | ORAL | 5 refills | Status: DC

## 2021-11-14 NOTE — Progress Notes (Signed)
[name] [name] [id number] Dec 12, 1990 31 y.o.  Subjective:   Patient ID:  [name] [name] is a 31 y.o. (DOB 1991-03-11) female.  Chief Complaint: No chief complaint on file.   HPI [name] [name] presents to the office today for follow-up of MDD, GAD, obsessional thoughts, and ADHD.  Describes mood today as "ok". Pleasant. Tearful at times. Mood symptoms - denies depression. Denies irritability. Reports anxiety. Reporting panic attacks - lasting the whole day and doesn't go away. Stating "I'm doing alright". Mother has moved to [facility] to help her other sister. Still has family support locally - but mostly feels she is solely responsible for children. Husband is supportive financially - has partial custody, but does not "exercise that right". Has supportive friends. Seeing Elio Forget - therapist. Improved interest and motivation. Energy levels improved. Active, has a regular exercise routine. Enjoys some usual interests and activities. Married, but separated. Lives with 2 daughters. Family and friends local and supportive.  Appetite adequate. Weight gain Sleeps well most nights. Averages 8 to 9  hours.  Focus and concentration improved. Completing tasks. Managing some aspects of household. Unemployed. Denies SI or HI.  Denies AH or VH.  Previous medication trials: Adderall, Clonazepam, Lorazepam, Trazadone, Lexapro, Paxil, Zoloft.    PHQ2-9    Flowsheet Row Office Visit from 03/26/2021 in [facility] Video Visit from 01/03/2020 in Primary Care at [facility] Visit from 05/16/2019 in Primary Care at [facility] Telemedicine from 01/14/2019 in Primary Care at [facility] Visit from 08/06/2018 in Primary Care at [facility] Total Score 0 0 0 0 0        Review of Systems:  Review of Systems  Musculoskeletal:  Negative for gait problem.  Neurological:  Negative for tremors.  Psychiatric/Behavioral:         Please refer to HPI   Medications: I have reviewed the  patient's current medications.  Current Outpatient Medications  Medication Sig Dispense Refill   hydrOXYzine (ATARAX) 10 MG tablet Take 1 tablet (10 mg total) by mouth 3 (three) times daily as needed. 30 tablet 5   albuterol (VENTOLIN HFA) 108 (90 Base) MCG/ACT inhaler INHALE 1 TO 2 PUFFS INTO THE LUNGS EVERY 6 HOURS AS NEEDED FOR WHEEZING OR SHORTNESS OF BREATH 6.7 g 2   Alcohol Swabs PADS Use as directed with mounjaro 100 each 1   busPIRone (BUSPAR) 10 MG tablet Take one tablet twice daily. 180 tablet 2   doxycycline (VIBRA-TABS) 100 MG tablet Take 1 tablet (100 mg total) by mouth 2 (two) times daily. 20 tablet 0   fluticasone (FLONASE) 50 MCG/ACT nasal spray SHAKE LIQUID AND USE 1 SPRAY IN EACH NOSTRIL TWICE DAILY 16 g 2   hydrochlorothiazide (HYDRODIURIL) 12.5 MG tablet Take 1 tablet (12.5 mg total) by mouth daily as needed. 30 tablet 2   LORazepam (ATIVAN) 1 MG tablet Take one tablet three times daily as needed for anxiety. 45 tablet 5   mupirocin ointment (BACTROBAN) 2 % Apply 1 application topically 2 (two) times daily. 22 g 0   QUEtiapine (SEROQUEL) 25 MG tablet TAKE 1 TABLET(25 MG) BY MOUTH TWICE DAILY 180 tablet 2   Semaglutide-Weight Management (WEGOVY) 0.5 MG/0.5ML SOAJ Inject 0.5 mg into the skin once a week. 2 mL 1   No current facility-administered medications for this visit.    Medication Side Effects: None  Allergies:  Allergies  Allergen Reactions   Ceclor [Cefaclor] Hives    Age 62 mild rash Tolerates ceftriaxone  Ondansetron     Per mother. Patient has serotonin syndrome and can not have Zofran.     Past Medical History:  Diagnosis Date   ADHD (attention deficit hyperactivity disorder)    Anxiety    Panic attacks    Serotonin syndrome    Vaginal Pap smear, abnormal     Past Medical History, Surgical history, Social history, and Family history were reviewed and updated as appropriate.   Please see review of systems for further details on the patient's  review from today.   Objective:   Physical Exam:  There were no vitals taken for this visit.  Physical Exam Constitutional:      General: She is not in acute distress. Musculoskeletal:        General: No deformity.  Neurological:     Mental Status: She is alert and oriented to person, place, and time.     Coordination: Coordination normal.  Psychiatric:        Attention and Perception: Attention and perception normal. She does not perceive auditory or visual hallucinations.        Mood and Affect: Mood normal. Mood is not anxious or depressed. Affect is not labile, blunt, angry or inappropriate.        Speech: Speech normal.        Behavior: Behavior normal.        Thought Content: Thought content normal. Thought content is not paranoid or delusional. Thought content does not include homicidal or suicidal ideation. Thought content does not include homicidal or suicidal plan.        Cognition and Memory: Cognition and memory normal.        Judgment: Judgment normal.     Comments: Insight intact    Lab Review:     Component Value Date/Time   NA 141 09/24/2021 0927   K 4.6 09/24/2021 0927   CL 101 09/24/2021 0927   CO2 27 09/24/2021 0927   GLUCOSE 75 09/24/2021 0927   GLUCOSE 140 (H) 08/30/2020 1050   BUN 10 09/24/2021 0927   CREATININE 0.79 09/24/2021 0927   CALCIUM 9.8 09/24/2021 0927   PROT 7.2 04/04/2021 1236   ALBUMIN 4.4 04/04/2021 1236   AST 16 04/04/2021 1236   ALT 21 04/04/2021 1236   ALKPHOS 119 04/04/2021 1236   BILITOT 0.3 04/04/2021 1236   GFRNONAA >60 08/30/2020 1050   GFRAA 133 06/29/2017 1224       Component Value Date/Time   WBC 11.7 (H) 08/30/2020 1050   RBC 4.90 08/30/2020 1050   HGB 14.5 08/30/2020 1050   HGB 13.7 06/29/2017 1224   HGB 12.7 06/06/2015 0000   HCT 43.9 08/30/2020 1050   HCT 41.0 06/29/2017 1224   HCT 37 06/06/2015 0000   PLT 480 (H) 08/30/2020 1050   PLT 312 06/29/2017 1224   PLT 300 06/06/2015 0000   MCV 89.6 08/30/2020  1050   MCV 92.5 02/16/2018 1224   MCV 93 06/29/2017 1224   MCH 29.6 08/30/2020 1050   MCHC 33.0 08/30/2020 1050   RDW 12.4 08/30/2020 1050   RDW 13.3 06/29/2017 1224   LYMPHSABS 1.9 08/30/2020 1050   LYMPHSABS 1.7 06/29/2017 1224   MONOABS 0.6 08/30/2020 1050   EOSABS 0.0 08/30/2020 1050   EOSABS 0.2 06/29/2017 1224   BASOSABS 0.1 08/30/2020 1050   BASOSABS 0.0 06/29/2017 1224    No results found for: POCLITH, LITHIUM   No results found for: PHENYTOIN, PHENOBARB, VALPROATE, CBMZ   .res Assessment: Plan:  Plan:  PDMP reviewed  Current medications:  1. Lorazepam 1mg  TID - will send in a 2 week supply with a refill. 2. Seroquel 25mg  - 2 at bedtime - taking one at bedtime. 3. Buspar 10mg  BID  4. Hydroxyzine 10mg  TID  Seeing therapist - .   Time spent with patient was 25 minutes. Greater than 50% of face to face time with patient was spent on counseling and coordination of care. We discussed     RTC 4 weeks  Patient advised to contact office with any questions, adverse effects, or acute worsening in signs and symptoms.  Discussed potential metabolic side effects associated with atypical antipsychotics, as well as potential risk for movement side effects. Advised pt to contact office if movement side effects occur.   Discussed potential benefits, risk, and side effects of benzodiazepines to include potential risk of tolerance and dependence, as well as possible drowsiness.  Advised patient not to drive if experiencing drowsiness and to take lowest possible effective dose to minimize risk of dependence and tolerance.  Diagnoses and all orders for this visit:  Attention deficit hyperactivity disorder (ADHD), unspecified ADHD type  Generalized anxiety disorder -     LORazepam (ATIVAN) 1 MG tablet; Take one tablet three times daily as needed for anxiety. -     hydrOXYzine (ATARAX) 10 MG tablet; Take 1 tablet (10 mg total) by mouth 3 (three) times daily as  needed.  Obsessional thoughts  Major depressive disorder, recurrent episode, moderate (HCC)     Please see After Visit Summary for patient specific instructions.  Future Appointments  Date Time Provider Department Center  11/27/2021  9:00 AM , [facility] CP-CP None    No orders of the defined types were placed in this encounter.   -------------------------------

## 2021-11-19 ENCOUNTER — Telehealth: Payer: Self-pay

## 2021-11-19 NOTE — Telephone Encounter (Signed)
Completed pa for wegovy  ? ?

## 2021-11-27 ENCOUNTER — Ambulatory Visit: Payer: BC Managed Care – PPO | Admitting: Mental Health

## 2021-11-27 ENCOUNTER — Other Ambulatory Visit: Payer: Self-pay

## 2021-11-28 ENCOUNTER — Ambulatory Visit (INDEPENDENT_AMBULATORY_CARE_PROVIDER_SITE_OTHER): Payer: BC Managed Care – PPO | Admitting: Mental Health

## 2021-11-28 DIAGNOSIS — F411 Generalized anxiety disorder: Secondary | ICD-10-CM | POA: Diagnosis not present

## 2021-11-28 NOTE — Progress Notes (Signed)
[facility] Note ? ?Name: [name] [name] ?Date: 11/28/2021 ?MRN: [id number] ?DOB: July 22, 1991 ?PCP: Arnette Felts, FNP ? ?Time spent: 51 minutes ? ?Treatment:  Individual therapy ? ? ?Mental Status Exam: ?   ?Appearance:    Casual     ?Behavior:   Appropriate  ?Motor:   WNL  ?Speech/Language:    Clear and Coherent  ?Affect:   Full range  ?Mood:   Depressed, anxious  ?Thought process:   normal  ?Thought content:     WNL  ?Sensory/Perceptual disturbances:     none  ?Orientation:   x4  ?Attention:   Good  ?Concentration:   Good  ?Memory:   Intact  ?Fund of knowledge:    Consistent with age and development  ?Insight:     developing  ?Judgment:    Good  ?Impulse Control:   Good  ? ? ?Reported Symptoms:  Appetite disturbance, sleep disturbance, anxiety (8/10), depression (8/10), low motivation, isolative ? ?Risk Assessment: ?Danger to Self:  No ?Self-injurious Behavior: No ?Danger to Others: No ?Duty to Warn:no ?Physical Aggression / Violence:No  ?Access to Firearms a concern: No  ?Gang Involvement:No  ?Patient / guardian was educated about steps to take if suicide or homicide risk level increases between visits:   ?While future psychiatric events cannot be accurately predicted, the patient does not currently require acute inpatient psychiatric care and does not currently meet [facility] involuntary commitment criteria. ? ? ? ?Medications: ?Current Outpatient Medications  ?Medication Sig Dispense Refill  ? albuterol (VENTOLIN HFA) 108 (90 Base) MCG/ACT inhaler INHALE 1 TO 2 PUFFS INTO THE LUNGS EVERY 6 HOURS AS NEEDED FOR WHEEZING OR SHORTNESS OF BREATH 6.7 g 2  ? Alcohol Swabs PADS Use as directed with mounjaro 100 each 1  ? busPIRone (BUSPAR) 10 MG tablet Take one tablet twice daily. 180 tablet 2  ? doxycycline (VIBRA-TABS) 100 MG tablet Take 1 tablet (100 mg total) by mouth 2 (two) times daily. 20 tablet 0  ? fluticasone (FLONASE) 50 MCG/ACT nasal spray SHAKE LIQUID AND USE 1 SPRAY IN EACH NOSTRIL TWICE  DAILY 16 g 2  ? hydrochlorothiazide (HYDRODIURIL) 12.5 MG tablet Take 1 tablet (12.5 mg total) by mouth daily as needed. 30 tablet 2  ? hydrOXYzine (ATARAX) 10 MG tablet Take 1 tablet (10 mg total) by mouth 3 (three) times daily as needed. 30 tablet 5  ? LORazepam (ATIVAN) 1 MG tablet Take one tablet three times daily as needed for anxiety. 45 tablet 5  ? mupirocin ointment (BACTROBAN) 2 % Apply 1 application topically 2 (two) times daily. 22 g 0  ? QUEtiapine (SEROQUEL) 25 MG tablet TAKE 1 TABLET(25 MG) BY MOUTH TWICE DAILY 180 tablet 2  ? Semaglutide-Weight Management (WEGOVY) 0.5 MG/0.5ML SOAJ Inject 0.5 mg into the skin once a week. 2 mL 1  ? ?No current facility-administered medications for this visit.  ? ? ?Subjective:  ?Patient presents for session on time.  Patient shared recent events progress she stated that she and her husband remain separated and that it has been going better in terms of her getting the needed time as they have settled into a good routine where he sees them every other weekend.  She went on to share some more family history related to her father.  She stated that he had a history of coping with addiction which has been ongoing for the past 10 to 20 years.  She stated she and other family members staged an intervention due to his decompensation,  challenges with mobility due to having some other health issues.  She stated that since COVID, she has noticed his health decline.  She stated that she has a stepbrother who is 56 years old who is currently staying with her but is in the process of going to live with a close family friend who is soon to obtain custody.  She identified ways she is trying to cope and care for herself, keeping up with her children's needs, getting them to their activities after school.  She also plans to attend a single parent support group as an outlet for herself. ? ? ? ?Interventions:  mot. Interviewing, supportive therapy ? ?Diagnoses:  ?No diagnosis  found. ? ? ? ?Plan: Patient is to utilize coping skills as discussed in session, utilize support system. ? ?Long-term goal:   ?Reduce overall level, frequency, and intensity of the feelings of depression and anxiety 8-9/10 to a 0-2/10 in severity for at least 3 consecutive months per patient report ?  ?Short-term goal:  ?Increase ability to relax by identifying activities, such as spending time with friends. ?Increase functioning daily evidenced by such tasks as making lunch for her kids, cleaning when needed, achieving at least 3 tasks per/day ?Maintain healthy sleep hygiene, getting adequate rest ?Improve coping skills as identified in session to decrease panic attacks, depression symptoms. ?Comply with court requirements to decrease stress and anxiety  ? ?  ?Assessment of progress:  progressing ? ? ?Waldron Session, [facility]  ?

## 2021-12-02 ENCOUNTER — Encounter: Payer: Self-pay | Admitting: Nurse Practitioner

## 2021-12-02 ENCOUNTER — Other Ambulatory Visit: Payer: Self-pay

## 2021-12-02 MED ORDER — MOUNJARO 5 MG/0.5ML ~~LOC~~ SOAJ: 5.0000 mg | SUBCUTANEOUS | 0 refills | Status: DC

## 2021-12-03 ENCOUNTER — Other Ambulatory Visit: Payer: Self-pay

## 2021-12-03 MED ORDER — MOUNJARO 7.5 MG/0.5ML ~~LOC~~ SOAJ: 7.5000 mg | SUBCUTANEOUS | 1 refills | Status: DC

## 2021-12-03 NOTE — Telephone Encounter (Signed)
Pt notified that she must make it to her may appt to get future refills.  ?

## 2021-12-12 ENCOUNTER — Telehealth: Payer: Self-pay | Admitting: Adult Health

## 2021-12-12 NOTE — Telephone Encounter (Signed)
Patient called stating she is having a hard time and it's too early for her Ativan Rx. She is requesting a few pills to get her through the weekend. She uses the [facility] on [address]. Contact information is # [phone]. ?

## 2021-12-12 NOTE — Telephone Encounter (Signed)
LVM to [facility]. Last filled 3/19, #45 tablets x15 days.  ?

## 2021-12-12 NOTE — Telephone Encounter (Signed)
Is she out? How much has she been taking?

## 2021-12-13 ENCOUNTER — Other Ambulatory Visit: Payer: Self-pay

## 2021-12-13 DIAGNOSIS — F411 Generalized anxiety disorder: Secondary | ICD-10-CM

## 2021-12-13 MED ORDER — LORAZEPAM 1 MG PO TABS: ORAL_TABLET | ORAL | 0 refills | Status: DC

## 2021-12-13 NOTE — Telephone Encounter (Signed)
We can send in enough for a few days until refill available.

## 2021-12-13 NOTE — Telephone Encounter (Signed)
Pended.

## 2021-12-13 NOTE — Telephone Encounter (Signed)
Patient said she will run out today. She said she is taking as prescribed, but a few times took 1/2 extra. She last filled 3/19 and is due for a RF on Sunday.   ?

## 2021-12-19 ENCOUNTER — Other Ambulatory Visit: Payer: Self-pay | Admitting: Nurse Practitioner

## 2021-12-19 DIAGNOSIS — R0981 Nasal congestion: Secondary | ICD-10-CM

## 2021-12-23 ENCOUNTER — Ambulatory Visit: Payer: BC Managed Care – PPO | Admitting: Adult Health

## 2021-12-23 NOTE — Progress Notes (Signed)
Patient no show appointment. ? ?

## 2021-12-30 ENCOUNTER — Ambulatory Visit (INDEPENDENT_AMBULATORY_CARE_PROVIDER_SITE_OTHER): Payer: Self-pay | Admitting: Adult Health

## 2021-12-30 DIAGNOSIS — Z91199 Patient's noncompliance with other medical treatment and regimen due to unspecified reason: Secondary | ICD-10-CM

## 2021-12-30 NOTE — Progress Notes (Signed)
Patient no show appointment. ? ?

## 2021-12-31 ENCOUNTER — Ambulatory Visit (INDEPENDENT_AMBULATORY_CARE_PROVIDER_SITE_OTHER): Payer: BC Managed Care – PPO | Admitting: Adult Health

## 2021-12-31 ENCOUNTER — Encounter: Payer: Self-pay | Admitting: Adult Health

## 2021-12-31 DIAGNOSIS — F331 Major depressive disorder, recurrent, moderate: Secondary | ICD-10-CM

## 2021-12-31 DIAGNOSIS — F909 Attention-deficit hyperactivity disorder, unspecified type: Secondary | ICD-10-CM | POA: Diagnosis not present

## 2021-12-31 DIAGNOSIS — F428 Other obsessive-compulsive disorder: Secondary | ICD-10-CM

## 2021-12-31 DIAGNOSIS — F411 Generalized anxiety disorder: Secondary | ICD-10-CM

## 2021-12-31 MED ORDER — QUETIAPINE FUMARATE 25 MG PO TABS: ORAL_TABLET | ORAL | 3 refills | Status: DC

## 2021-12-31 MED ORDER — BUSPIRONE HCL 10 MG PO TABS: ORAL_TABLET | ORAL | 3 refills | Status: DC

## 2021-12-31 MED ORDER — LORAZEPAM 1 MG PO TABS: ORAL_TABLET | ORAL | 2 refills | Status: DC

## 2021-12-31 MED ORDER — HYDROXYZINE HCL 10 MG PO TABS: 10.0000 mg | ORAL_TABLET | Freq: Three times a day (TID) | ORAL | 3 refills | Status: DC | PRN

## 2021-12-31 NOTE — Progress Notes (Signed)
[name] [name] ?[id number] ?Feb 15, 1991 ?31 y.o. ? ?Subjective:  ? ?Patient ID:  [name] CoyerSophia M [name] is a 31 y.o. (DOB Feb 15, 1991) female. ? ?Chief Complaint: No chief complaint on file. ? ? ?HPI ?[name] [name] presents to the office today for follow-up of MDD, GAD, obsessional thoughts, and ADHD. ? ?Describes mood today as "ok". Pleasant. Tearful at times. Mood symptoms - denies depression. Reports irritability at times. Reports anxiety. Reporting panic attacks. Concerned anxiety may related to lower BG levels. Stating "I'm doing good". Has a good support system in place and feels more confident. Daughters involved in therapy. Mother and sister visited recently. Brother staying with her and helping out. [place]. Has supportive friends. Seeing [name] [name] - therapist. Improved interest and motivation. ?Energy levels improved. Active, has a regular exercise routine. ?Enjoys some usual interests and activities. Married, but separated. Lives with 2 daughters. Family and friends local and supportive.  ?Appetite adequate. Weight gain. ?Sleeps well most nights. Averages 8 to 9 hours.  ?Focus and concentration improving - making lists - prioritizing things. Completing tasks. Managing some aspects of household. Unemployed. ?Denies SI or HI.  ?Denies AH or VH. ? ?Previous medication trials: Adderall, Clonazepam, Lorazepam, Trazadone, Lexapro, Paxil, Zoloft. ? ? ?PHQ2-9   ? ?Flowsheet Row Office Visit from 03/26/2021 in [facility] Video Visit from 01/03/2020 [facility] Office Visit from [facility] Tele[facility]icine from 01/14/2019 [facility] Office Visit from 08/06/2018 [facility]  ?PHQ-2 Total Score 0 0 0 0 0  ? ?  ?  ? ?Review of Systems:  ?Review of Systems  ?Musculoskeletal:  Negative for gait problem.  ?Neurological:  Negative for tremors.  ?Psychiatric/Behavioral:    ?     Please refer to HPI  ? ?Medications: I have  reviewed the patient's current medications. ? ?Current Outpatient Medications  ?Medication Sig Dispense Refill  ? albuterol (VENTOLIN HFA) 108 (90 Base) MCG/ACT inhaler INHALE 1 TO 2 PUFFS INTO THE LUNGS EVERY 6 HOURS AS NEEDED FOR WHEEZING OR SHORTNESS OF BREATH 6.7 g 2  ? Alcohol Swabs PADS Use as directed with mounjaro 100 each 1  ? busPIRone (BUSPAR) 10 MG tablet Take one tablet twice daily. 180 tablet 3  ? doxycycline (VIBRA-TABS) 100 MG tablet Take 1 tablet (100 mg total) by mouth 2 (two) times daily. 20 tablet 0  ? fluticasone (FLONASE) 50 MCG/ACT nasal spray SHAKE LIQUID AND USE 1 SPRAY IN EACH NOSTRIL TWICE DAILY 16 g 2  ? hydrochlorothiazide (HYDRODIURIL) 12.5 MG tablet Take 1 tablet (12.5 mg total) by mouth daily as needed. 30 tablet 2  ? hydrOXYzine (ATARAX) 10 MG tablet Take 1 tablet (10 mg total) by mouth 3 (three) times daily as needed. 270 tablet 3  ? LORazepam (ATIVAN) 1 MG tablet Take one tablet three times daily as needed for anxiety. 90 tablet 2  ? mupirocin ointment (BACTROBAN) 2 % Apply 1 application topically 2 (two) times daily. 22 g 0  ? QUEtiapine (SEROQUEL) 25 MG tablet TAKE 1 TABLET(25 MG) BY MOUTH TWICE DAILY 180 tablet 3  ? tirzepatide [facility](MOUNJARO) 5 MG/0.5ML Pen Inject 5 mg into the skin once a week. 2 mL 0  ? tirzepatide (MOUNJARO) 7.5 MG/0.5ML Pen Inject 7.5 mg into the skin once a week. 1 mL 1  ? ?No current facility-administered medications for this visit.  ? ? ?Medication Side Effects: None ? ?Allergies:  ?Allergies  ?  Allergen Reactions  ? Ceclor [Cefaclor] Hives  ?  Age 9 mild rash ?Tolerates ceftriaxone  ? Ondansetron   ?  Per mother. Patient has serotonin syndrome and can not have Zofran.   ? ? ?Past Medical History:  ?Diagnosis Date  ? ADHD (attention deficit hyperactivity disorder)   ? Anxiety   ? Panic attacks   ? Serotonin syndrome   ? Vaginal Pap smear, abnormal   ? ? ?Past Medical History, Surgical history, Social history, and Family history were reviewed and updated as  appropriate.  ? ?Please see review of systems for further details on the patient's review from today.  ? ?Objective:  ? ?Physical Exam:  ?There were no vitals taken for this visit. ? ?Physical Exam ?Constitutional:   ?   General: She is not in acute distress. ?Musculoskeletal:     ?   General: No deformity.  ?Neurological:  ?   Mental Status: She is alert and oriented to person, place, and time.  ?   Coordination: Coordination normal.  ?Psychiatric:     ?   Attention and Perception: Attention and perception normal. She does not perceive auditory or visual hallucinations.     ?   Mood and Affect: Mood normal. Mood is not anxious or depressed. Affect is not labile, blunt, angry or inappropriate.     ?   Speech: Speech normal.     ?   Behavior: Behavior normal.     ?   Thought Content: Thought content normal. Thought content is not paranoid or delusional. Thought content does not include homicidal or suicidal ideation. Thought content does not include homicidal or suicidal plan.     ?   Cognition and Memory: Cognition and memory normal.     ?   Judgment: Judgment normal.  ?   Comments: Insight intact  ? ? ?Lab Review:  ?   ?Component Value Date/Time  ? NA 141 09/24/2021 0927  ? K 4.6 09/24/2021 0927  ? CL 101 09/24/2021 0927  ? CO2 27 09/24/2021 0927  ? GLUCOSE 75 09/24/2021 0927  ? GLUCOSE 140 (H) 08/30/2020 1050  ? BUN 10 09/24/2021 0927  ? CREATININE 0.79 09/24/2021 0927  ? CALCIUM 9.8 09/24/2021 0927  ? PROT 7.2 04/04/2021 1236  ? ALBUMIN 4.4 04/04/2021 1236  ? AST 16 04/04/2021 1236  ? ALT 21 04/04/2021 1236  ? ALKPHOS 119 04/04/2021 1236  ? BILITOT 0.3 04/04/2021 1236  ? GFRNONAA >60 08/30/2020 1050  ? GFRAA 133 06/29/2017 1224  ? ? ?   ?Component Value Date/Time  ? WBC 11.7 (H) 08/30/2020 1050  ? RBC 4.90 08/30/2020 1050  ? HGB 14.5 08/30/2020 1050  ? HGB 13.7 06/29/2017 1224  ? HGB 12.7 06/06/2015 0000  ? HCT 43.9 08/30/2020 1050  ? HCT 41.0 06/29/2017 1224  ? HCT 37 06/06/2015 0000  ? PLT 480 (H) 08/30/2020  1050  ? PLT 312 06/29/2017 1224  ? PLT 300 06/06/2015 0000  ? MCV 89.6 08/30/2020 1050  ? MCV 92.5 02/16/2018 1224  ? MCV 93 06/29/2017 1224  ? MCH 29.6 08/30/2020 1050  ? MCHC 33.0 08/30/2020 1050  ? RDW 12.4 08/30/2020 1050  ? RDW 13.3 06/29/2017 1224  ? LYMPHSABS 1.9 08/30/2020 1050  ? LYMPHSABS 1.7 06/29/2017 1224  ? MONOABS 0.6 08/30/2020 1050  ? EOSABS 0.0 08/30/2020 1050  ? EOSABS 0.2 06/29/2017 1224  ? BASOSABS 0.1 08/30/2020 1050  ? BASOSABS 0.0 06/29/2017 1224  ? ? ?No results found for: POCLITH,  LITHIUM  ? ?No results found for: PHENYTOIN, PHENOBARB, VALPROATE, CBMZ  ? ?.res ?Assessment: Plan:   ? ?Plan: ? ?PDMP reviewed ? ?Current medications: ? ?1. Lorazepam 1mg  TID  ?2. Seroquel 25mg  - 2 at bedtime - taking one at bedtime. ?3. Buspar 10mg  BID  ?4. Hydroxyzine 10mg  TID ? ?Seeing therapist - . ? ? ?Time spent with patient was 25 minutes. Greater than 50% of face to face time with patient was spent on counseling and coordination of care. We discussed    ? ?RTC 6 weeks ? ?Patient advised to contact office with any questions, adverse effects, or acute worsening in signs and symptoms. ? ?Discussed potential metabolic side effects associated with atypical antipsychotics, as well as potential risk for movement side effects. Advised pt to contact office if movement side effects occur.  ? ?Discussed potential benefits, risk, and side effects of benzodiazepines to include potential risk of tolerance and dependence, as well as possible drowsiness.  Advised patient not to drive if experiencing drowsiness and to take lowest possible effective dose to minimize risk of dependence and tolerance. ? ?Diagnoses and all orders for this visit: ? ?Attention deficit hyperactivity disorder (ADHD), unspecified ADHD type ? ?Generalized anxiety disorder ?-     LORazepam (ATIVAN) 1 MG tablet; Take one tablet three times daily as needed for anxiety. ?-     busPIRone (BUSPAR) 10 MG tablet; Take one tablet twice  daily. ?-     hydrOXYzine (ATARAX) 10 MG tablet; Take 1 tablet (10 mg total) by mouth 3 (three) times daily as needed. ? ?Major depressive disorder, recurrent episode, moderate (HCC) ?-     QUEtiapine (SEROQUEL)

## 2022-01-06 ENCOUNTER — Ambulatory Visit (INDEPENDENT_AMBULATORY_CARE_PROVIDER_SITE_OTHER): Payer: BC Managed Care – PPO | Admitting: Mental Health

## 2022-01-06 DIAGNOSIS — F411 Generalized anxiety disorder: Secondary | ICD-10-CM

## 2022-01-06 NOTE — Progress Notes (Signed)
[facility] ? ?Name: RONNELL MAKAREWICZ ?Date: 01/06/2022 ?MRN: [id number] ?DOB: 02/26/1991 ?PCP: Arnette Felts, FNP ? ?Time spent: 52 minutes ? ?Treatment:  Individual therapy ? ? ?Mental Status Exam: ?   ?Appearance:    Casual     ?Behavior:   Appropriate  ?Motor:   WNL  ?Speech/Language:    Clear and Coherent  ?Affect:   Full range  ?Mood:   Depressed, anxious  ?Thought process:   normal  ?Thought content:     WNL  ?Sensory/Perceptual disturbances:     none  ?Orientation:   x4  ?Attention:   Good  ?Concentration:   Good  ?Memory:   Intact  ?Fund of knowledge:    Consistent with age and development  ?Insight:     developing  ?Judgment:    Good  ?Impulse Control:   Good  ? ? ?Reported Symptoms:  Appetite disturbance, sleep disturbance, anxiety (8/10), depression (8/10), low motivation, isolative ? ?Risk Assessment: ?Danger to Self:  No ?Self-injurious Behavior: No ?Danger to Others: No ?Duty to Warn:no ?Physical Aggression / Violence:No  ?Access to Firearms a concern: No  ?Gang Involvement:No  ?Patient / guardian was educated about steps to take if suicide or homicide risk level increases between visits:   ?While future psychiatric events cannot be accurately predicted, the patient does not currently require acute inpatient psychiatric care and does not currently meet [facility] involuntary commitment criteria. ? ? ? ?Medications: ?Current Outpatient Medications  ?Medication Sig Dispense Refill  ? albuterol (VENTOLIN HFA) 108 (90 Base) MCG/ACT inhaler INHALE 1 TO 2 PUFFS INTO THE LUNGS EVERY 6 HOURS AS NEEDED FOR WHEEZING OR SHORTNESS OF BREATH 6.7 g 2  ? Alcohol Swabs PADS Use as directed with mounjaro 100 each 1  ? busPIRone (BUSPAR) 10 MG tablet Take one tablet twice daily. 180 tablet 3  ? doxycycline (VIBRA-TABS) 100 MG tablet Take 1 tablet (100 mg total) by mouth 2 (two) times daily. 20 tablet 0  ? fluticasone (FLONASE) 50 MCG/ACT nasal spray SHAKE LIQUID AND USE 1 SPRAY IN EACH NOSTRIL TWICE  DAILY 16 g 2  ? hydrochlorothiazide (HYDRODIURIL) 12.5 MG tablet Take 1 tablet (12.5 mg total) by mouth daily as needed. 30 tablet 2  ? hydrOXYzine (ATARAX) 10 MG tablet Take 1 tablet (10 mg total) by mouth 3 (three) times daily as needed. 270 tablet 3  ? LORazepam (ATIVAN) 1 MG tablet Take one tablet three times daily as needed for anxiety. 90 tablet 2  ? mupirocin ointment (BACTROBAN) 2 % Apply 1 application topically 2 (two) times daily. 22 g 0  ? QUEtiapine (SEROQUEL) 25 MG tablet TAKE 1 TABLET(25 MG) BY MOUTH TWICE DAILY 180 tablet 3  ? tirzepatide [facility]) 5 MG/0.5ML Pen Inject 5 mg into the skin once a week. 2 mL 0  ? tirzepatide (MOUNJARO) 7.5 MG/0.5ML Pen Inject 7.5 mg into the skin once a week. 1 mL 1  ? ?No current facility-administered medications for this visit.  ? ? ?Subjective:  ?Patient presents for session on time. Patient shared progress and event since last visit. She shared ongoing changes in her middle relationship. She said that seeing her husband remain separated. She expresses frustration due to his poor communication. Going on to share an example of his going on a work/vacation trip which lasted about 12 days. She stated that she was unaware that he was going on this trip and learned of it a few days later when communicating with one another via text. She  stated that she was sick at the time along with one of her children with strep throat and was potentially needing him to assist with watching one of their children during that time. She stated that when he sees the children is typically when he comes over to their home. She continues to identify how stress in the relationship due to ongoing communication issues; she said that he will most likely take a job and a city a few hours away as he recently informed her of this opportunity.  ? ? ? ?Interventions:  mot. Interviewing, supportive therapy ? ?Diagnoses:  ?  ICD-10-CM   ?1. Generalized anxiety disorder  F41.1   ?  ? ? ? ? ?Plan: Patient  is to utilize coping skills as discussed in session, utilize support system. ? ?Long-term goal:   ?Reduce overall level, frequency, and intensity of the feelings of depression and anxiety 8-9/10 to a 0-2/10 in severity for at least 3 consecutive months per patient report ?  ?Short-term goal:  ?Increase ability to relax by identifying activities, such as spending time with friends. ?Increase functioning daily evidenced by such tasks as making lunch for her kids, cleaning when needed, achieving at least 3 tasks per/day ?Maintain healthy sleep hygiene, getting adequate rest ?Improve coping skills as identified in session to decrease panic attacks, depression symptoms. ?Comply with court requirements to decrease stress and anxiety  ? ?  ?Assessment of progress:  progressing ? ? ?Waldron Session, [facility]  ?

## 2022-01-13 ENCOUNTER — Ambulatory Visit: Payer: BC Managed Care – PPO | Admitting: Nurse Practitioner

## 2022-02-28 ENCOUNTER — Telehealth: Payer: Self-pay | Admitting: Mental Health

## 2022-02-28 NOTE — Telephone Encounter (Signed)
L/M for [name] [name] -DSS.

## 2022-03-04 ENCOUNTER — Ambulatory Visit (INDEPENDENT_AMBULATORY_CARE_PROVIDER_SITE_OTHER): Payer: BC Managed Care – PPO | Admitting: Nurse Practitioner

## 2022-03-04 ENCOUNTER — Encounter: Payer: Self-pay | Admitting: Nurse Practitioner

## 2022-03-04 VITALS — BP 128/70 | HR 99 | Temp 98.1°F | Ht 64.8 in | Wt 175.0 lb

## 2022-03-04 DIAGNOSIS — E8881 Metabolic syndrome: Secondary | ICD-10-CM

## 2022-03-04 DIAGNOSIS — Z6829 Body mass index (BMI) 29.0-29.9, adult: Secondary | ICD-10-CM

## 2022-03-04 DIAGNOSIS — E663 Overweight: Secondary | ICD-10-CM | POA: Diagnosis not present

## 2022-03-04 NOTE — Progress Notes (Signed)
I,Tianna Badgett,acting as a Neurosurgeon for [employer], FNP.,have documented all relevant documentation on the behalf of Arnette Felts, FNP,as directed by  Arnette Felts, FNP while in the presence of Arnette Felts, FNP.  This visit occurred during the SARS-CoV-2 public health emergency.  Safety protocols were in place, including screening questions prior to the visit, additional usage of staff PPE, and extensive cleaning of exam room while observing appropriate contact time as indicated for disinfecting solutions.  Subjective:     Patient ID: [name] [name] , female    DOB: 06-06-1991 , 31 y.o.   MRN: [id number]   Chief Complaint  Patient presents with   Weight Check    HPI  Patient presents today for weight check. She is exercising daily now. She is eating a healthy diet.  She is taking antibiotics for an abcess to left side of medications.  She has gotten her real Clinical research associate.   Wt Readings from Last 3 Encounters: 03/04/22 : 175 lb (79.4 kg) 09/24/21 : 196 lb 6.4 oz (89.1 kg) 06/18/21 : 230 lb 3.2 oz (104.4 kg)       Past Medical History:  Diagnosis Date   ADHD (attention deficit hyperactivity disorder)    Anxiety    Panic attacks    Serotonin syndrome    Vaginal Pap smear, abnormal      Family History  Problem Relation Age of Onset   Hypertension Mother    Heart disease Father    Cancer Father        testicular   Hypertension Father    Hyperlipidemia Father    Heart disease Paternal Grandfather      Current Outpatient Medications:    albuterol (VENTOLIN HFA) 108 (90 Base) MCG/ACT inhaler, INHALE 1 TO 2 PUFFS INTO THE LUNGS EVERY 6 HOURS AS NEEDED FOR WHEEZING OR SHORTNESS OF BREATH, Disp: 6.7 g, Rfl: 2   Alcohol Swabs PADS, Use as directed with mounjaro, Disp: 100 each, Rfl: 1   busPIRone (BUSPAR) 10 MG tablet, Take one tablet twice daily., Disp: 180 tablet, Rfl: 3   doxycycline (VIBRA-TABS) 100 MG tablet, Take 1 tablet (100 mg total) by mouth 2 (two) times  daily., Disp: 20 tablet, Rfl: 0   fluticasone (FLONASE) 50 MCG/ACT nasal spray, SHAKE LIQUID AND USE 1 SPRAY IN EACH NOSTRIL TWICE DAILY, Disp: 16 g, Rfl: 2   hydrochlorothiazide (HYDRODIURIL) 12.5 MG tablet, Take 1 tablet (12.5 mg total) by mouth daily as needed., Disp: 30 tablet, Rfl: 2   hydrOXYzine (ATARAX) 10 MG tablet, Take 1 tablet (10 mg total) by mouth 3 (three) times daily as needed., Disp: 270 tablet, Rfl: 3   LORazepam (ATIVAN) 1 MG tablet, Take one tablet three times daily as needed for anxiety., Disp: 90 tablet, Rfl: 2   mupirocin ointment (BACTROBAN) 2 %, Apply 1 application topically 2 (two) times daily., Disp: 22 g, Rfl: 0   QUEtiapine (SEROQUEL) 25 MG tablet, TAKE 1 TABLET(25 MG) BY MOUTH TWICE DAILY, Disp: 180 tablet, Rfl: 3   tirzepatide (MOUNJARO) 5 MG/0.5ML Pen, Inject 5 mg into the skin once a week., Disp: 2 mL, Rfl: 0   tirzepatide (MOUNJARO) 7.5 MG/0.5ML Pen, Inject 7.5 mg into the skin once a week., Disp: 1 mL, Rfl: 1   Allergies  Allergen Reactions   Ceclor [Cefaclor] Hives    Age 81 mild rash Tolerates ceftriaxone   Ondansetron     Per mother. Patient has serotonin syndrome and can not have Zofran.  Review of Systems  Constitutional: Negative.   Respiratory: Negative.    Cardiovascular: Negative.   Gastrointestinal: Negative.   Neurological: Negative.   Psychiatric/Behavioral: Negative.       Today's Vitals   03/04/22 1041  BP: 128/70  Pulse: 99  Temp: 98.1 F (36.7 C)  TempSrc: Oral  Weight: 175 lb (79.4 kg)  Height: 5' 4.8" (1.646 m)   Body mass index is 29.3 kg/m.  Wt Readings from Last 3 Encounters:  03/04/22 175 lb (79.4 kg)  09/24/21 196 lb 6.4 oz (89.1 kg)  06/18/21 230 lb 3.2 oz (104.4 kg)    Objective:  Physical Exam Vitals reviewed.  Constitutional:      General: She is not in acute distress.    Appearance: Normal appearance.  Cardiovascular:     Rate and Rhythm: Normal rate and regular rhythm.     Pulses: Normal pulses.      Heart sounds: Normal heart sounds. No murmur heard. Pulmonary:     Effort: Pulmonary effort is normal. No respiratory distress.     Breath sounds: Normal breath sounds. No wheezing.  Neurological:     General: No focal deficit present.     Mental Status: She is alert and oriented to person, place, and time.     Cranial Nerves: No cranial nerve deficit.     Motor: No weakness.  Psychiatric:        Mood and Affect: Mood normal.        Behavior: Behavior normal.        Thought Content: Thought content normal.        Judgment: Judgment normal.         Assessment And Plan:     1. Insulin resistance Comments: Normal at last visit, since she will not be able to get the [facility] she is encouraged to avoid breads, carbs and sugary foods/drinks.   2. Overweight with body mass index (BMI) of 29 to 29.9 in adult Comments: She has done well with her weight loss of 55 lbs since October 2022, encouraged to continue exercising regularly and eating a healthy diet    Patient was given opportunity to ask questions. Patient verbalized understanding of the plan and was able to repeat key elements of the plan. All questions were answered to their satisfaction.  Arnette Felts, FNP   I, Arnette Felts, FNP, have reviewed all documentation for this visit. The documentation on 03/04/22 for the exam, diagnosis, procedures, and orders are all accurate and complete.   IF YOU HAVE BEEN REFERRED TO A SPECIALIST, IT MAY TAKE 1-2 WEEKS TO SCHEDULE/PROCESS THE REFERRAL. IF YOU HAVE NOT HEARD FROM US/SPECIALIST IN TWO WEEKS, PLEASE GIVE [place] A CALL AT [phone].   THE PATIENT IS ENCOURAGED TO PRACTICE SOCIAL DISTANCING DUE TO THE COVID-19 PANDEMIC.

## 2022-03-04 NOTE — Patient Instructions (Addendum)
Obesity, Adult ?Obesity is having too much body fat. Being obese means that your weight is more than what is healthy for you.  ?BMI (body mass index) is a number that explains how much body fat you have. If you have a BMI of 30 or more, you are obese. ?Obesity can cause serious health problems, such as: ?Stroke. ?Coronary artery disease (CAD). ?Type 2 diabetes. ?Some types of cancer. ?High blood pressure (hypertension). ?High cholesterol. ?Gallbladder stones. ?Obesity can also contribute to: ?Osteoarthritis. ?Sleep apnea. ?Infertility problems. ?What are the causes? ?Eating meals each day that are high in calories, sugar, and fat. ?Drinking a lot of drinks that have sugar in them. ?Being born with genes that may make you more likely to become obese. ?Having a medical condition that causes obesity. ?Taking certain medicines. ?Sitting a lot (having a sedentary lifestyle). ?Not getting enough sleep. ?What increases the risk? ?Having a family history of obesity. ?Living in an area with limited access to: ?Parks, recreation centers, or sidewalks. ?Healthy food choices, such as grocery stores and farmers' markets. ?What are the signs or symptoms? ?The main sign is having too much body fat. ?How is this treated? ?Treatment for this condition often includes changing your lifestyle. Treatment may include: ?Changing your diet. This may include making a healthy meal plan. ?Exercise. This may include activity that causes your heart to beat faster (aerobic exercise) and strength training. Work with your doctor to design a program that works for you. ?Medicine to help you lose weight. This may be used if you are not able to lose one pound a week after 6 weeks of healthy eating and more exercise. ?Treating conditions that cause the obesity. ?Surgery. Options may include gastric banding and gastric bypass. This may be done if: ?Other treatments have not helped to improve your condition. ?You have a BMI of 40 or higher. ?You have  life-threatening health problems related to obesity. ?Follow these instructions at home: ?Eating and drinking ? ?Follow advice from your doctor about what to eat and drink. Your doctor may tell you to: ?Limit fast food, sweets, and processed snack foods. ?Choose low-fat options. For example, choose low-fat milk instead of whole milk. ?Eat five or more servings of fruits or vegetables each day. ?Eat at home more often. This gives you more control over what you eat. ?Choose healthy foods when you eat out. ?Learn to read food labels. This will help you learn how much food is in one serving. ?Keep low-fat snacks available. ?Avoid drinks that have a lot of sugar in them. These include soda, fruit juice, iced tea with sugar, and flavored milk. ?Drink enough water to keep your pee (urine) pale yellow. ?Do not go on fad diets. ?Physical activity ?Exercise often, as told by your doctor. Most adults should get up to 150 minutes of moderate-intensity exercise every week.Ask your doctor: ?What types of exercise are safe for you. ?How often you should exercise. ?Warm up and stretch before being active. ?Do slow stretching after being active (cool down). ?Rest between times of being active. ?Lifestyle ?Work with your doctor and a food expert (dietitian) to set a weight-loss goal that is best for you. ?Limit your screen time. ?Find ways to reward yourself that do not involve food. ?Do not drink alcohol if: ?Your doctor tells you not to drink. ?You are pregnant, may be pregnant, or are planning to become pregnant. ?If you drink alcohol: ?Limit how much you have to: ?0-1 drink a day for women. ?0-2 drinks   a day for men. Know how much alcohol is in your drink. In the [place] one drink equals one 12 oz bottle of beer (355 mL), one 5 oz glass of wine (148 mL), or one 1 oz glass of hard liquor (44 mL). General instructions Keep a weight-loss journal. This can help you keep track of: The food that you eat. How much exercise you  get. Take over-the-counter and prescription medicines only as told by your doctor. Take vitamins and supplements only as told by your doctor. Think about joining a support group. Pay attention to your mental health as obesity can lead to depression or self esteem issues. Keep all follow-up visits. Contact a doctor if: You cannot meet your weight-loss goal after you have changed your diet and lifestyle for 6 weeks. You are having trouble breathing. Summary Obesity is having too much body fat. Being obese means that your weight is more than what is healthy for you. Work with your doctor to set a weight-loss goal. Get regular exercise as told by your doctor. This information is not intended to replace advice given to you by your health care provider. Make sure you discuss any questions you have with your health care provider. Document Revised: 04/09/2021 Document Reviewed: 04/09/2021 Elsevier Patient Education  2023 Elsevier Inc.  You have done very well with your weight loss, continue with regular exercise and healthy diet.

## 2022-03-13 ENCOUNTER — Other Ambulatory Visit: Payer: Self-pay

## 2022-03-19 ENCOUNTER — Ambulatory Visit: Payer: BC Managed Care – PPO | Admitting: Nurse Practitioner

## 2022-03-27 ENCOUNTER — Telehealth: Payer: Self-pay | Admitting: Adult Health

## 2022-03-27 NOTE — Telephone Encounter (Signed)
Pt has 90 day refills on file

## 2022-03-27 NOTE — Telephone Encounter (Signed)
Pt called requesting RF on Buspirone, Hydroxyzine and Quetiapine to [facility]  Cherene Julian. Apt 8/1

## 2022-03-28 ENCOUNTER — Ambulatory Visit (INDEPENDENT_AMBULATORY_CARE_PROVIDER_SITE_OTHER): Payer: BC Managed Care – PPO | Admitting: Mental Health

## 2022-03-28 DIAGNOSIS — F411 Generalized anxiety disorder: Secondary | ICD-10-CM

## 2022-03-28 NOTE — Progress Notes (Signed)
[facility]  Name: [name] [name] Date: 03/28/22 MRN: [id number] DOB: 11/26/1990 PCP: Arnette Felts, FNP  Time spent: 51 minutes  Treatment:  Individual therapy   Mental Status Exam:    Appearance:    Casual     Behavior:   Appropriate  Motor:   WNL  Speech/Language:    Clear and Coherent  Affect:   Full range  Mood:   Depressed, anxious  Thought process:   normal  Thought content:     WNL  Sensory/Perceptual disturbances:     none  Orientation:   x4  Attention:   Good  Concentration:   Good  Memory:   Intact  Fund of knowledge:    Consistent with age and development  Insight:     developing  Judgment:    Good  Impulse Control:   Good    Reported Symptoms:  Appetite disturbance, sleep disturbance, anxiety (8/10), depression (8/10), low motivation, isolative  Risk Assessment: Danger to Self:  No Self-injurious Behavior: No Danger to Others: No Duty to Warn:no Physical Aggression / Violence:No  Access to Firearms a concern: No  Gang Involvement:No  Patient / guardian was educated about steps to take if suicide or homicide risk level increases between visits:   While future psychiatric events cannot be accurately predicted, the patient does not currently require acute inpatient psychiatric care and does not currently meet [facility] involuntary commitment criteria.    Medications: Current Outpatient Medications  Medication Sig Dispense Refill   albuterol (VENTOLIN HFA) 108 (90 Base) MCG/ACT inhaler INHALE 1 TO 2 PUFFS INTO THE LUNGS EVERY 6 HOURS AS NEEDED FOR WHEEZING OR SHORTNESS OF BREATH 6.7 g 2   Alcohol Swabs PADS Use as directed with mounjaro 100 each 1   busPIRone (BUSPAR) 10 MG tablet Take one tablet twice daily. 180 tablet 3   doxycycline (VIBRA-TABS) 100 MG tablet Take 1 tablet (100 mg total) by mouth 2 (two) times daily. 20 tablet 0   fluticasone (FLONASE) 50 MCG/ACT nasal spray SHAKE LIQUID AND USE 1 SPRAY IN EACH NOSTRIL TWICE  DAILY 16 g 2   hydrochlorothiazide (HYDRODIURIL) 12.5 MG tablet Take 1 tablet (12.5 mg total) by mouth daily as needed. 30 tablet 2   hydrOXYzine (ATARAX) 10 MG tablet Take 1 tablet (10 mg total) by mouth 3 (three) times daily as needed. 270 tablet 3   LORazepam (ATIVAN) 1 MG tablet TAKE ONE TABLET BY MOUTH THREE TIMES A DAY AS NEEDED FOR ANXIETY 90 tablet 1   mupirocin ointment (BACTROBAN) 2 % Apply 1 application topically 2 (two) times daily. 22 g 0   QUEtiapine (SEROQUEL) 25 MG tablet TAKE 1 TABLET(25 MG) BY MOUTH TWICE DAILY 180 tablet 3   No current facility-administered medications for this visit.    Subjective:  Patient presents for session on time.  Patient shared recent events in progress.  She states that she and her husband remain separated.  She continues to work to meet the needs day-to-day for her children, getting one of them in therapy.  Explored with patient events that lead her to feel emotionally distressed and we explored coping skills.  Provided patient resource for review between sessions related to emotional regulation.  Provide some psychoeducation related to CBT.   Interventions:  mot. Interviewing, supportive therapy  Diagnoses:    ICD-10-CM   1. Generalized anxiety disorder  F41.1          Plan: Patient is to utilize coping skills as discussed in session,  utilize support system.  Long-term goal:   Reduce overall level, frequency, and intensity of the feelings of depression and anxiety 8-9/10 to a 0-2/10 in severity for at least 3 consecutive months per patient report   Short-term goal:  Increase ability to relax by identifying activities, such as spending time with friends. Increase functioning daily evidenced by such tasks as making lunch for her kids, cleaning when needed, achieving at least 3 tasks per/day Maintain healthy sleep hygiene, getting adequate rest Improve coping skills as identified in session to decrease panic attacks, depression  symptoms. Comply with court requirements to decrease stress and anxiety     Assessment of progress:  progressing   Waldron Session, [facility]

## 2022-04-07 ENCOUNTER — Other Ambulatory Visit: Payer: Self-pay | Admitting: Adult Health

## 2022-04-07 DIAGNOSIS — F411 Generalized anxiety disorder: Secondary | ICD-10-CM

## 2022-04-07 NOTE — Telephone Encounter (Signed)
Filled 6/28 appt 8/1

## 2022-04-15 ENCOUNTER — Ambulatory Visit (INDEPENDENT_AMBULATORY_CARE_PROVIDER_SITE_OTHER): Payer: BC Managed Care – PPO | Admitting: Adult Health

## 2022-04-15 ENCOUNTER — Ambulatory Visit (INDEPENDENT_AMBULATORY_CARE_PROVIDER_SITE_OTHER): Payer: BC Managed Care – PPO | Admitting: Mental Health

## 2022-04-15 ENCOUNTER — Encounter: Payer: Self-pay | Admitting: Adult Health

## 2022-04-15 DIAGNOSIS — F428 Other obsessive-compulsive disorder: Secondary | ICD-10-CM | POA: Diagnosis not present

## 2022-04-15 DIAGNOSIS — F909 Attention-deficit hyperactivity disorder, unspecified type: Secondary | ICD-10-CM

## 2022-04-15 DIAGNOSIS — F411 Generalized anxiety disorder: Secondary | ICD-10-CM | POA: Diagnosis not present

## 2022-04-15 DIAGNOSIS — F331 Major depressive disorder, recurrent, moderate: Secondary | ICD-10-CM | POA: Diagnosis not present

## 2022-04-15 NOTE — Progress Notes (Signed)
[facility]  Name: [name] [name] Date: 04/15/22 MRN: [id number] DOB: 1991/07/21 PCP: Arnette Felts, FNP  Time spent: 46 minutes  Treatment:  Individual therapy   Mental Status Exam:    Appearance:    Casual     Behavior:   Appropriate  Motor:   WNL  Speech/Language:    Clear and Coherent  Affect:   Full range  Mood:   Anxious, pleasant  Thought process:   normal  Thought content:     WNL  Sensory/Perceptual disturbances:     none  Orientation:   x4  Attention:   Good  Concentration:   Good  Memory:   Intact  Fund of knowledge:    Consistent with age and development  Insight:     developing  Judgment:    Good  Impulse Control:   Good    Reported Symptoms:  Appetite disturbance, sleep disturbance, anxiety (8/10), depression (8/10), low motivation, isolative  Risk Assessment: Danger to Self:  No Self-injurious Behavior: No Danger to Others: No Duty to Warn:no Physical Aggression / Violence:No  Access to Firearms a concern: No  Gang Involvement:No  Patient / guardian was educated about steps to take if suicide or homicide risk level increases between visits:   While future psychiatric events cannot be accurately predicted, the patient does not currently require acute inpatient psychiatric care and does not currently meet [facility] involuntary commitment criteria.    Medications: Current Outpatient Medications  Medication Sig Dispense Refill   albuterol (VENTOLIN HFA) 108 (90 Base) MCG/ACT inhaler INHALE 1 TO 2 PUFFS INTO THE LUNGS EVERY 6 HOURS AS NEEDED FOR WHEEZING OR SHORTNESS OF BREATH 6.7 g 2   Alcohol Swabs PADS Use as directed with mounjaro 100 each 1   busPIRone (BUSPAR) 10 MG tablet Take one tablet twice daily. 180 tablet 3   doxycycline (VIBRA-TABS) 100 MG tablet Take 1 tablet (100 mg total) by mouth 2 (two) times daily. 20 tablet 0   fluticasone (FLONASE) 50 MCG/ACT nasal spray SHAKE LIQUID AND USE 1 SPRAY IN EACH NOSTRIL TWICE DAILY  16 g 2   hydrochlorothiazide (HYDRODIURIL) 12.5 MG tablet Take 1 tablet (12.5 mg total) by mouth daily as needed. 30 tablet 2   hydrOXYzine (ATARAX) 10 MG tablet Take 1 tablet (10 mg total) by mouth 3 (three) times daily as needed. 270 tablet 3   LORazepam (ATIVAN) 1 MG tablet TAKE ONE TABLET BY MOUTH THREE TIMES A DAY AS NEEDED FOR ANXIETY 90 tablet 1   mupirocin ointment (BACTROBAN) 2 % Apply 1 application topically 2 (two) times daily. 22 g 0   QUEtiapine (SEROQUEL) 25 MG tablet TAKE 1 TABLET(25 MG) BY MOUTH TWICE DAILY 180 tablet 3   No current facility-administered medications for this visit.    Subjective:  Patient presents for session on time.  Patient shared recent events in progress.  Explored family relationships, specifically with of her husband.  She stated they remain separated but he comes to the house to visit her and the children.  She stated she follow through with homework assignment related to emotional regulation and shared experiences.  Collaboratively, explored ways to set boundaries at times and that her relationships effectively to better manage feelings of distress.  Interventions:  mot. Interviewing, supportive therapy  Diagnoses:    ICD-10-CM   1. Major depressive disorder, recurrent episode, moderate (HCC)  F33.1           Plan: Patient is to utilize coping skills as discussed  in session, utilize support system.  Long-term goal:   Reduce overall level, frequency, and intensity of the feelings of depression and anxiety 8-9/10 to a 0-2/10 in severity for at least 3 consecutive months per patient report   Short-term goal:  Increase ability to relax by identifying activities, such as spending time with friends. Increase functioning daily evidenced by such tasks as making lunch for her kids, cleaning when needed, achieving at least 3 tasks per/day Maintain healthy sleep hygiene, getting adequate rest Improve coping skills as identified in session to decrease  panic attacks, depression symptoms. Comply with court requirements to decrease stress and anxiety     Assessment of progress:  progressing   Waldron Session, [facility]

## 2022-04-15 NOTE — Progress Notes (Signed)
[name] [name] [id number] 04/18/1991 31 y.o.  Subjective:   Patient ID:  [name] [name] is a 31 y.o. (DOB 1990-12-20) female.  Chief Complaint: No chief complaint on file.   HPI [name] [name] presents to the office today for follow-up of MDD, GAD, obsessional thoughts, and ADHD.  Describes mood today as "ok". Pleasant. Tearful at times. Mood symptoms - denies depression and irritability. Reports more breakthrough anxiety. Denies panic attacks. Mood is consistent. Stating "I fel like I'm doing ok". Feels like medications are helpful. Family doing well. Daughters returning to school late August. Has supportive family and friends. Seeing Elio Forget - therapist. Improved interest and motivation. Energy levels improved. Active, has a regular exercise routine. Enjoys some usual interests and activities. Married, but separated. Lives with 2 daughters. Family and friends local and supportive.  Appetite adequate. Weight gain. Sleeps well most nights. Averages 8 to 9 hours.  Focus and concentration difficulties. Completing tasks. Managing some aspects of household. Unemployed. Denies SI or HI.  Denies AH or VH. Denies self harm. Denies substance use.  Previous medication trials: Adderall, Clonazepam, Lorazepam, Trazadone, Lexapro, Paxil, Zoloft.   PHQ2-9    Flowsheet Row Office Visit from 03/26/2021 in [facility] Video Visit from 01/03/2020 in Primary Care at [facility] Visit from 05/16/2019 in Primary Care at [facility] Telemedicine from 01/14/2019 in Primary Care at [facility] Visit from 08/06/2018 in Primary Care at [facility] Total Score 0 0 0 0 0        Review of Systems:  Review of Systems  Musculoskeletal:  Negative for gait problem.  Neurological:  Negative for tremors.  Psychiatric/Behavioral:         Please refer to HPI    Medications: I have reviewed the patient's current medications.  Current Outpatient Medications  Medication Sig Dispense  Refill   albuterol (VENTOLIN HFA) 108 (90 Base) MCG/ACT inhaler INHALE 1 TO 2 PUFFS INTO THE LUNGS EVERY 6 HOURS AS NEEDED FOR WHEEZING OR SHORTNESS OF BREATH 6.7 g 2   Alcohol Swabs PADS Use as directed with mounjaro 100 each 1   busPIRone (BUSPAR) 10 MG tablet Take one tablet twice daily. 180 tablet 3   doxycycline (VIBRA-TABS) 100 MG tablet Take 1 tablet (100 mg total) by mouth 2 (two) times daily. 20 tablet 0   fluticasone (FLONASE) 50 MCG/ACT nasal spray SHAKE LIQUID AND USE 1 SPRAY IN EACH NOSTRIL TWICE DAILY 16 g 2   hydrochlorothiazide (HYDRODIURIL) 12.5 MG tablet Take 1 tablet (12.5 mg total) by mouth daily as needed. 30 tablet 2   hydrOXYzine (ATARAX) 10 MG tablet Take 1 tablet (10 mg total) by mouth 3 (three) times daily as needed. 270 tablet 3   LORazepam (ATIVAN) 1 MG tablet TAKE ONE TABLET BY MOUTH THREE TIMES A DAY AS NEEDED FOR ANXIETY 90 tablet 1   mupirocin ointment (BACTROBAN) 2 % Apply 1 application topically 2 (two) times daily. 22 g 0   QUEtiapine (SEROQUEL) 25 MG tablet TAKE 1 TABLET(25 MG) BY MOUTH TWICE DAILY 180 tablet 3   No current facility-administered medications for this visit.    Medication Side Effects: None  Allergies:  Allergies  Allergen Reactions   Ceclor [Cefaclor] Hives    Age 44 mild rash Tolerates ceftriaxone   Ondansetron     Per mother. Patient has serotonin syndrome and can not have Zofran.     Past Medical History:  Diagnosis Date   ADHD (attention deficit hyperactivity disorder)  Anxiety    Panic attacks    Serotonin syndrome    Vaginal Pap smear, abnormal     Past Medical History, Surgical history, Social history, and Family history were reviewed and updated as appropriate.   Please see review of systems for further details on the patient's review from today.   Objective:   Physical Exam:  There were no vitals taken for this visit.  Physical Exam Constitutional:      General: She is not in acute distress. Musculoskeletal:         General: No deformity.  Neurological:     Mental Status: She is alert and oriented to person, place, and time.     Coordination: Coordination normal.  Psychiatric:        Attention and Perception: Attention and perception normal. She does not perceive auditory or visual hallucinations.        Mood and Affect: Mood normal. Mood is not anxious or depressed. Affect is not labile, blunt, angry or inappropriate.        Speech: Speech normal.        Behavior: Behavior normal.        Thought Content: Thought content normal. Thought content is not paranoid or delusional. Thought content does not include homicidal or suicidal ideation. Thought content does not include homicidal or suicidal plan.        Cognition and Memory: Cognition and memory normal.        Judgment: Judgment normal.     Comments: Insight intact     Lab Review:     Component Value Date/Time   NA 141 09/24/2021 0927   K 4.6 09/24/2021 0927   CL 101 09/24/2021 0927   CO2 27 09/24/2021 0927   GLUCOSE 75 09/24/2021 0927   GLUCOSE 140 (H) 08/30/2020 1050   BUN 10 09/24/2021 0927   CREATININE 0.79 09/24/2021 0927   CALCIUM 9.8 09/24/2021 0927   PROT 7.2 04/04/2021 1236   ALBUMIN 4.4 04/04/2021 1236   AST 16 04/04/2021 1236   ALT 21 04/04/2021 1236   ALKPHOS 119 04/04/2021 1236   BILITOT 0.3 04/04/2021 1236   GFRNONAA >60 08/30/2020 1050   GFRAA 133 06/29/2017 1224       Component Value Date/Time   WBC 11.7 (H) 08/30/2020 1050   RBC 4.90 08/30/2020 1050   HGB 14.5 08/30/2020 1050   HGB 13.7 06/29/2017 1224   HGB 12.7 06/06/2015 0000   HCT 43.9 08/30/2020 1050   HCT 41.0 06/29/2017 1224   HCT 37 06/06/2015 0000   PLT 480 (H) 08/30/2020 1050   PLT 312 06/29/2017 1224   PLT 300 06/06/2015 0000   MCV 89.6 08/30/2020 1050   MCV 92.5 02/16/2018 1224   MCV 93 06/29/2017 1224   MCH 29.6 08/30/2020 1050   MCHC 33.0 08/30/2020 1050   RDW 12.4 08/30/2020 1050   RDW 13.3 06/29/2017 1224   LYMPHSABS 1.9  08/30/2020 1050   LYMPHSABS 1.7 06/29/2017 1224   MONOABS 0.6 08/30/2020 1050   EOSABS 0.0 08/30/2020 1050   EOSABS 0.2 06/29/2017 1224   BASOSABS 0.1 08/30/2020 1050   BASOSABS 0.0 06/29/2017 1224    No results found for: "POCLITH", "LITHIUM"   No results found for: "PHENYTOIN", "PHENOBARB", "VALPROATE", "CBMZ"   .res Assessment: Plan:    Plan:  PDMP reviewed  Current medications:  1. Lorazepam 1mg  TID  2. Seroquel 25mg  - 2 at bedtime - taking one at bedtime. 3. Buspar 10mg  BID  4. Hydroxyzine 10mg  TID  Seeing therapist - Elio Forget.   Time spent with patient was 25 minutes. Greater than 50% of face to face time with patient was spent on counseling and coordination of care. We discussed     RTC 3 months  Patient advised to contact office with any questions, adverse effects, or acute worsening in signs and symptoms.  Discussed potential metabolic side effects associated with atypical antipsychotics, as well as potential risk for movement side effects. Advised pt to contact office if movement side effects occur.   Discussed potential benefits, risk, and side effects of benzodiazepines to include potential risk of tolerance and dependence, as well as possible drowsiness.  Advised patient not to drive if experiencing drowsiness and to take lowest possible effective dose to minimize risk of dependence and tolerance.  Diagnoses and all orders for this visit:  Generalized anxiety disorder  Major depressive disorder, recurrent episode, moderate (HCC)  Attention deficit hyperactivity disorder (ADHD), unspecified ADHD type  Obsessional thoughts     Please see After Visit Summary for patient specific instructions.  Future Appointments  Date Time Provider Department Center  04/15/2022 10:00 AM Waldron Session, [facility] CP-CP None  05/07/2022 10:00 AM Waldron Session, [facility] CP-CP None  05/22/2022 10:00 AM Waldron Session, [facility] CP-CP None  06/05/2022 10:00 AM  Waldron Session, [facility] CP-CP None  06/19/2022 10:00 AM Waldron Session, [facility] CP-CP None  07/03/2022 10:00 AM Waldron Session, [facility] CP-CP None    No orders of the defined types were placed in this encounter.   -------------------------------

## 2022-05-01 ENCOUNTER — Encounter: Payer: Self-pay | Admitting: Nurse Practitioner

## 2022-05-01 ENCOUNTER — Ambulatory Visit (INDEPENDENT_AMBULATORY_CARE_PROVIDER_SITE_OTHER): Payer: BC Managed Care – PPO | Admitting: Nurse Practitioner

## 2022-05-01 VITALS — BP 136/80 | HR 105 | Temp 98.2°F | Ht 64.8 in | Wt 183.0 lb

## 2022-05-01 DIAGNOSIS — E88819 Insulin resistance, unspecified: Secondary | ICD-10-CM

## 2022-05-01 DIAGNOSIS — Z6830 Body mass index (BMI) 30.0-30.9, adult: Secondary | ICD-10-CM | POA: Diagnosis not present

## 2022-05-01 DIAGNOSIS — E6609 Other obesity due to excess calories: Secondary | ICD-10-CM | POA: Diagnosis not present

## 2022-05-01 DIAGNOSIS — E8881 Metabolic syndrome: Secondary | ICD-10-CM

## 2022-05-01 DIAGNOSIS — D508 Other iron deficiency anemias: Secondary | ICD-10-CM

## 2022-05-01 MED ORDER — SAXENDA 18 MG/3ML ~~LOC~~ SOPN: 3.0000 mg | PEN_INJECTOR | Freq: Every day | SUBCUTANEOUS | 1 refills | Status: DC

## 2022-05-01 NOTE — Patient Instructions (Signed)
Earache, Adult ?An earache, or ear pain, can be caused by many things, including: ?An infection. ?Ear wax buildup. ?Ear pressure. ?Something in the ear that should not be there (foreign body). ?A sore throat. ?Tooth problems. ?Jaw problems. ?Treatment of the earache will depend on the cause. If the cause is not clear or cannot be determined, you may need to watch your symptoms until your earache goes away or until a cause is found. ?Follow these instructions at home: ?Medicines ?Take or apply over-the-counter and prescription medicines only as told by your health care provider. ?If you were prescribed an antibiotic medicine, use it as told by your health care provider. Do not stop using the antibiotic even if you start to feel better. ?Do not put anything in your ear other than medicine that is prescribed by your health care provider. ?Managing pain ?If directed, apply heat to the affected area as often as told by your health care provider. Use the heat source that your health care provider recommends, such as a moist heat pack or a heating pad. ?Place a towel between your skin and the heat source. ?Leave the heat on for 20-30 minutes. ?Remove the heat if your skin turns bright red. This is especially important if you are unable to feel pain, heat, or cold. You may have a greater risk of getting burned. ?If directed, put ice on the affected area as often as told by your health care provider. To do this: ? ?  ? ?Put ice in a plastic bag. ?Place a towel between your skin and the bag. ?Leave the ice on for 20 minutes, 2-3 times a day. ?General instructions ?Pay attention to any changes in your symptoms. ?Try resting in an upright position instead of lying down. This may help to reduce pressure in your ear and relieve pain. ?Chew gum if it helps to relieve your ear pain. ?Treat any allergies as told by your health care provider. ?Drink enough fluid to keep your urine pale yellow. ?It is up to you to get the results of  any tests that were done. Ask your health care provider, or the department that is doing the tests, when your results will be ready. ?Keep all follow-up visits as told by your health care provider. This is important. ?Contact a health care provider if: ?Your pain does not improve within 2 days. ?Your earache gets worse. ?You have new symptoms. ?You have a fever. ?Get help right away if you: ?Have a severe headache. ?Have a stiff neck. ?Have trouble swallowing. ?Have redness or swelling behind your ear. ?Have fluid or blood coming from your ear. ?Have hearing loss. ?Feel dizzy. ?Summary ?An earache, or ear pain, can be caused by many things. ?Treatment of the earache will depend on the cause. Follow recommendations from your health care provider to treat your ear pain. ?If the cause is not clear or cannot be determined, you may need to watch your symptoms until your earache goes away or until a cause is found. ?Keep all follow-up visits as told by your health care provider. This is important. ?This information is not intended to replace advice given to you by your health care provider. Make sure you discuss any questions you have with your health care provider. ?Document Revised: 04/08/2019 Document Reviewed: 04/09/2019 ?Elsevier Patient Education ? 2023 Elsevier Inc. ? ?

## 2022-05-01 NOTE — Progress Notes (Signed)
I,[name] [name],acting as a Education administrator for [employer], FNP.,have documented all relevant documentation on the behalf of [name] Brine, FNP,as directed by  [name] Brine, FNP while in the presence of [name] [name], [name].  Subjective:     Patient ID: [name] [name] , female    DOB: 1990-09-19 , 31 y.o.   MRN: [id number]   Chief Complaint  Patient presents with   Abdominal Pain   Ear Fullness    HPI  Patient presents today for ear pressure.   Wt Readings from Last 3 Encounters: 05/01/22 : 183 lb (83 kg) 03/04/22 : 175 lb (79.4 kg) 09/24/21 : 196 lb 6.4 oz (89.1 kg)  Exercising - elliptical 30 minutes. She  She is eating fruit smoothies - low cal orange juice, she is drinking hard boiled eggs and cottage cheese, she eats broccoli and cheese. Avocados, chicken, beans (black beans).      Past Medical History:  Diagnosis Date   ADHD (attention deficit hyperactivity disorder)    Anxiety    Panic attacks    Serotonin syndrome    Vaginal Pap smear, abnormal      Family History  Problem Relation Age of Onset   Hypertension Mother    Heart disease Father    Cancer Father        testicular   Hypertension Father    Hyperlipidemia Father    Heart disease Paternal Grandfather      Current Outpatient Medications:    Liraglutide -Weight Management (SAXENDA) 18 MG/3ML SOPN, Inject 3 mg into the skin daily., Disp: 15 mL, Rfl: 1   busPIRone (BUSPAR) 10 MG tablet, Take one tablet twice daily., Disp: 180 tablet, Rfl: 3   hydrochlorothiazide (HYDRODIURIL) 12.5 MG tablet, Take 1 tablet (12.5 mg total) by mouth daily as needed., Disp: 30 tablet, Rfl: 2   hydrOXYzine (ATARAX) 10 MG tablet, Take 1 tablet (10 mg total) by mouth 3 (three) times daily as needed., Disp: 270 tablet, Rfl: 3   LORazepam (ATIVAN) 1 MG tablet, TAKE ONE TABLET BY MOUTH THREE TIMES A DAY AS NEEDED FOR ANXIETY, Disp: 90 tablet, Rfl: 1   mupirocin ointment (BACTROBAN) 2 %, Apply 1 application topically 2 (two) times daily.,  Disp: 22 g, Rfl: 0   QUEtiapine (SEROQUEL) 25 MG tablet, TAKE 1 TABLET(25 MG) BY MOUTH TWICE DAILY, Disp: 180 tablet, Rfl: 3   Allergies  Allergen Reactions   Ceclor [Cefaclor] Hives    Age 45 mild rash Tolerates ceftriaxone   Ondansetron     Per mother. Patient has serotonin syndrome and can not have Zofran.      Review of Systems  Constitutional: Negative.   Respiratory: Negative.    Cardiovascular: Negative.   Gastrointestinal: Negative.   Neurological: Negative.   Psychiatric/Behavioral: Negative.       Today's Vitals   05/01/22 1103  BP: 136/80  Pulse: (!) 105  Temp: 98.2 F (36.8 C)  TempSrc: Oral  Weight: 183 lb (83 kg)  Height: 5' 4.8" (1.646 m)   Body mass index is 30.64 kg/m.   Objective:  Physical Exam Vitals reviewed.  Constitutional:      General: She is not in acute distress.    Appearance: She is well-developed. She is obese.  HENT:     Head: Normocephalic.  Cardiovascular:     Rate and Rhythm: Normal rate and regular rhythm.     Heart sounds: Normal heart sounds. No murmur heard. Pulmonary:     Effort: Pulmonary effort is normal. No respiratory  distress.     Breath sounds: Normal breath sounds.  Skin:    General: Skin is warm and dry.     Capillary Refill: Capillary refill takes less than 2 seconds.  Neurological:     General: No focal deficit present.     Mental Status: She is alert.  Psychiatric:        Mood and Affect: Mood normal.        Behavior: Behavior normal.         Assessment And Plan:     1. Other iron deficiency anemia Comments: Iron had been low will recheck today - Iron, TIBC and Ferritin Panel - CBC  2. Insulin resistance Comments: Unable to tolerate metformin. No longer on Moujaro due to not covered for insulin resistance and insurance - CMP14+EGFR - Hemoglobin A1c  3. Class 1 obesity due to excess calories without serious comorbidity with body mass index (BMI) of 30.0 to 30.9 in adult Comments: Will refer to  [facility] to help with diet management. Encouraged to avoid stressing about weight gain. Will try Saxenda pending approv - Amb Ref to Medical Weight [facility]     Patient was given opportunity to ask questions. Patient verbalized understanding of the plan and was able to repeat key elements of the plan. All questions were answered to their satisfaction.  [name] Brine, FNP    I, [name] Brine, FNP, have reviewed all documentation for this visit. The documentation on 05/01/22 for the exam, diagnosis, procedures, and orders are all accurate and complete.  IF YOU HAVE BEEN REFERRED TO A SPECIALIST, IT MAY TAKE 1-2 WEEKS TO SCHEDULE/PROCESS THE REFERRAL. IF YOU HAVE NOT HEARD FROM US/SPECIALIST IN TWO WEEKS, PLEASE GIVE [place] A CALL AT [phone].   THE PATIENT IS ENCOURAGED TO PRACTICE SOCIAL DISTANCING DUE TO THE COVID-19 PANDEMIC.

## 2022-05-02 LAB — CBC
Hematocrit: 42.5 % (ref 34.0–46.6)
Hemoglobin: 14.5 g/dL (ref 11.1–15.9)
MCH: 29.9 pg (ref 26.6–33.0)
MCHC: 34.1 g/dL (ref 31.5–35.7)
MCV: 88 fL (ref 79–97)
Platelets: 485 10*3/uL — ABNORMAL HIGH (ref 150–450)
RBC: 4.85 x10E6/uL (ref 3.77–5.28)
RDW: 11.6 % — ABNORMAL LOW (ref 11.7–15.4)
WBC: 8 10*3/uL (ref 3.4–10.8)

## 2022-05-02 LAB — CMP14+EGFR
ALT: 16 IU/L (ref 0–32)
AST: 15 IU/L (ref 0–40)
Albumin/Globulin Ratio: 1.8 (ref 1.2–2.2)
Albumin: 5.1 g/dL — ABNORMAL HIGH (ref 3.9–4.9)
Alkaline Phosphatase: 117 IU/L (ref 44–121)
BUN/Creatinine Ratio: 15 (ref 9–23)
BUN: 12 mg/dL (ref 6–20)
Bilirubin Total: 0.7 mg/dL (ref 0.0–1.2)
CO2: 26 mmol/L (ref 20–29)
Calcium: 10.3 mg/dL — ABNORMAL HIGH (ref 8.7–10.2)
Chloride: 99 mmol/L (ref 96–106)
Creatinine, Ser: 0.8 mg/dL (ref 0.57–1.00)
Globulin, Total: 2.9 g/dL (ref 1.5–4.5)
Glucose: 68 mg/dL — ABNORMAL LOW (ref 70–99)
Potassium: 4.9 mmol/L (ref 3.5–5.2)
Sodium: 140 mmol/L (ref 134–144)
Total Protein: 8 g/dL (ref 6.0–8.5)
eGFR: 100 mL/min/{1.73_m2} (ref >59)

## 2022-05-02 LAB — IRON,TIBC AND FERRITIN PANEL
Ferritin: 137 ng/mL (ref 15–150)
Iron Saturation: 25 % (ref 15–55)
Iron: 109 ug/dL (ref 27–159)
Total Iron Binding Capacity: 440 ug/dL (ref 250–450)
UIBC: 331 ug/dL (ref 131–425)

## 2022-05-02 LAB — HEMOGLOBIN A1C
Est. average glucose Bld gHb Est-mCnc: 111 mg/dL
Hgb A1c MFr Bld: 5.5 % (ref 4.8–5.6)

## 2022-05-05 ENCOUNTER — Other Ambulatory Visit: Payer: Self-pay

## 2022-05-05 ENCOUNTER — Ambulatory Visit: Payer: BC Managed Care – PPO | Admitting: Nurse Practitioner

## 2022-05-05 MED ORDER — SAXENDA 18 MG/3ML ~~LOC~~ SOPN: 3.0000 mg | PEN_INJECTOR | Freq: Every day | SUBCUTANEOUS | 1 refills | Status: DC

## 2022-05-07 ENCOUNTER — Ambulatory Visit: Payer: BC Managed Care – PPO | Admitting: Mental Health

## 2022-05-22 ENCOUNTER — Ambulatory Visit (INDEPENDENT_AMBULATORY_CARE_PROVIDER_SITE_OTHER): Payer: BC Managed Care – PPO | Admitting: Mental Health

## 2022-05-22 DIAGNOSIS — F331 Major depressive disorder, recurrent, moderate: Secondary | ICD-10-CM | POA: Diagnosis not present

## 2022-05-22 NOTE — Progress Notes (Signed)
[facility] Note  Name: [name] [name] Date: 05/22/22 MRN: [id number] DOB: 02/22/1991 PCP: Arnette Felts, FNP  Time spent: 47 minutes  Treatment:  Individual therapy   Mental Status Exam:    Appearance:    Casual     Behavior:   Appropriate  Motor:   WNL  Speech/Language:    Clear and Coherent  Affect:   Full range  Mood:   Anxious, pleasant  Thought process:   normal  Thought content:     WNL  Sensory/Perceptual disturbances:     none  Orientation:   x4  Attention:   Good  Concentration:   Good  Memory:   Intact  Fund of knowledge:    Consistent with age and development  Insight:     developing  Judgment:    Good  Impulse Control:   Good    Reported Symptoms:  Appetite disturbance, sleep disturbance, anxiety (8/10), depression (8/10), low motivation, isolative  Risk Assessment: Danger to Self:  No Self-injurious Behavior: No Danger to Others: No Duty to Warn:no Physical Aggression / Violence:No  Access to Firearms a concern: No  Gang Involvement:No  Patient / guardian was educated about steps to take if suicide or homicide risk level increases between visits:   While future psychiatric events cannot be accurately predicted, the patient does not currently require acute inpatient psychiatric care and does not currently meet [facility] involuntary commitment criteria.    Medications: Current Outpatient Medications  Medication Sig Dispense Refill   busPIRone (BUSPAR) 10 MG tablet Take one tablet twice daily. 180 tablet 3   hydrochlorothiazide (HYDRODIURIL) 12.5 MG tablet Take 1 tablet (12.5 mg total) by mouth daily as needed. 30 tablet 2   hydrOXYzine (ATARAX) 10 MG tablet Take 1 tablet (10 mg total) by mouth 3 (three) times daily as needed. 270 tablet 3   Liraglutide -Weight Management (SAXENDA) 18 MG/3ML SOPN Inject 3 mg into the skin daily. 15 mL 1   LORazepam (ATIVAN) 1 MG tablet TAKE ONE TABLET BY MOUTH THREE TIMES A DAY AS NEEDED FOR ANXIETY 90  tablet 1   mupirocin ointment (BACTROBAN) 2 % Apply 1 application topically 2 (two) times daily. 22 g 0   QUEtiapine (SEROQUEL) 25 MG tablet TAKE 1 TABLET(25 MG) BY MOUTH TWICE DAILY 180 tablet 3   No current facility-administered medications for this visit.    Subjective:  Patient presents for session on time.  Patient shared recent events in progress.  She shared how she has had to cope with financial stress.  She went on to share how she now has a vehicle as her uncle is supportive and able to provide this to her.  She stated that her husband has not been supportive financially enough; she stated they remain separated.  How it has affected their daughter's was shared, how her older daughter tends to blame her for their separation.  She stated that her husband will frame their situation to their daughter in a way that thoughts her for their having marital issues and his not being able to be at home.  Patient stated that she continues to have her daughters in therapy and has found it helpful.  She shared her efforts to work on her emotional regulatory skills as discussed in previous sessions towards being nurturing, supportive to her daughters during this time and less reactive to her husband when he is visiting when he makes hurtful comments.   Interventions:  mot. Interviewing, supportive therapy  Diagnoses:  ICD-10-CM   1. Major depressive disorder, recurrent episode, moderate (HCC)  F33.1         Plan: Patient is to utilize coping skills as discussed in session, utilize support system.  Long-term goal:   Reduce overall level, frequency, and intensity of the feelings of depression and anxiety 8-9/10 to a 0-2/10 in severity for at least 3 consecutive months per patient report   Short-term goal:  Increase ability to relax by identifying activities, such as spending time with friends. Increase functioning daily evidenced by such tasks as making lunch for her kids, cleaning when needed,  achieving at least 3 tasks per/day Maintain healthy sleep hygiene, getting adequate rest Improve coping skills as identified in session to decrease panic attacks, depression symptoms. Comply with court requirements to decrease stress and anxiety     Assessment of progress:  progressing   Waldron Session, [facility]

## 2022-05-28 ENCOUNTER — Ambulatory Visit: Payer: BC Managed Care – PPO | Admitting: Nurse Practitioner

## 2022-06-02 ENCOUNTER — Other Ambulatory Visit: Payer: Self-pay | Admitting: Adult Health

## 2022-06-02 DIAGNOSIS — F411 Generalized anxiety disorder: Secondary | ICD-10-CM

## 2022-06-02 NOTE — Telephone Encounter (Signed)
Last filled 8/25, due 9/22

## 2022-06-03 NOTE — Telephone Encounter (Signed)
Pt lvm that she wants her lorazapam increased. She said that she has been on the same dose for 2 years and herPCP feels like it is not effective anymore.

## 2022-06-05 ENCOUNTER — Ambulatory Visit: Payer: BC Managed Care – PPO | Admitting: Mental Health

## 2022-06-06 ENCOUNTER — Telehealth: Payer: Self-pay | Admitting: Adult Health

## 2022-06-06 NOTE — Telephone Encounter (Signed)
Pt called requesting Rx for Lorazepam at [facility]. Pt is waiting there. Was told Rx would be sent this morning. It's been 24 hrs with out this med and mentioned concerned about seizures. Advise Pt when sent [phone]

## 2022-06-06 NOTE — Telephone Encounter (Signed)
Will need appt to discuss.

## 2022-06-06 NOTE — Telephone Encounter (Signed)
Pended RF.  

## 2022-06-06 NOTE — Telephone Encounter (Signed)
Will you follow up on this?

## 2022-06-12 ENCOUNTER — Encounter: Payer: Self-pay | Admitting: Nurse Practitioner

## 2022-06-19 ENCOUNTER — Ambulatory Visit: Payer: BC Managed Care – PPO | Admitting: Mental Health

## 2022-06-26 ENCOUNTER — Encounter: Payer: Self-pay | Admitting: Adult Health

## 2022-06-26 ENCOUNTER — Ambulatory Visit (INDEPENDENT_AMBULATORY_CARE_PROVIDER_SITE_OTHER): Payer: BC Managed Care – PPO | Admitting: Adult Health

## 2022-06-26 DIAGNOSIS — F331 Major depressive disorder, recurrent, moderate: Secondary | ICD-10-CM

## 2022-06-26 DIAGNOSIS — F428 Other obsessive-compulsive disorder: Secondary | ICD-10-CM

## 2022-06-26 DIAGNOSIS — F909 Attention-deficit hyperactivity disorder, unspecified type: Secondary | ICD-10-CM

## 2022-06-26 DIAGNOSIS — F411 Generalized anxiety disorder: Secondary | ICD-10-CM

## 2022-06-26 MED ORDER — LORAZEPAM 1 MG PO TABS: 1.0000 mg | ORAL_TABLET | Freq: Three times a day (TID) | ORAL | 2 refills | Status: DC | PRN

## 2022-06-26 MED ORDER — BUSPIRONE HCL 15 MG PO TABS: ORAL_TABLET | ORAL | 2 refills | Status: DC

## 2022-06-26 MED ORDER — HYDROXYZINE HCL 25 MG PO TABS: 25.0000 mg | ORAL_TABLET | Freq: Three times a day (TID) | ORAL | 2 refills | Status: DC | PRN

## 2022-06-26 NOTE — Progress Notes (Signed)
[name] [name] [id number] 1991-09-06 31 y.o.  Subjective:   Patient ID:  [name] [name] is a 31 y.o. (DOB February 24, 1991) female.  Chief Complaint: No chief complaint on file.   HPI [name] [name] presents to the office today for follow-up of MDD, GAD, obsessional thoughts and ADHD.  Describes mood today as "ok". Pleasant. Tearful at times. Mood symptoms - reports increased anxiety since last visit. Reports irritability - "trying to keep it away from the kids". Reports some depression. Reports worry and rumination - obsessively worrying about her children. Reports panic attacks - taking Ativan routinely. Mood is variable. Reports increased stressors with girls returning to school - her father with dementia - brother. Stating "I'm not doing as well as I was". Feels like medications are helpful, but not working as well as they did. Daughters doing well. Has supportive family and friends. Seeing Lanetta Inch - therapist. Improved interest and motivation. Energy levels improved. Active, has a regular exercise routine. Enjoys some usual interests and activities. Married, but separated. Lives with 2 daughters. Family and friends local and supportive.  Appetite adequate. Weight gain. Sleeps well most nights. Averages 8 to 9 hours.  Focus and concentration difficulties. Completing tasks. Managing some aspects of household. Unemployed. Denies SI or HI.  Denies AH or VH. Denies self harm. Denies substance use.  Previous medication trials: Adderall, Clonazepam, Lorazepam, Trazadone, Lexapro, Paxil, Zoloft.    [place] Office Visit from 05/01/2022 in [place] Visit from 03/26/2021 in [facility] Video Visit from 01/03/2020 in [facility] at [place] from 05/16/2019 in [facility] Care at [place] from 01/14/2019 in [place] at [facility] Total Score 0 0 0 0 0        Review of Systems:  Review of Systems   Musculoskeletal:  Negative for gait problem.  Neurological:  Negative for tremors.  Psychiatric/Behavioral:         Please refer to HPI    Medications: I have reviewed the patient's current medications.  Current Outpatient Medications  Medication Sig Dispense Refill   busPIRone (BUSPAR) 10 MG tablet Take one tablet twice daily. 180 tablet 3   hydrochlorothiazide (HYDRODIURIL) 12.5 MG tablet Take 1 tablet (12.5 mg total) by mouth daily as needed. 30 tablet 2   hydrOXYzine (ATARAX) 10 MG tablet Take 1 tablet (10 mg total) by mouth 3 (three) times daily as needed. 270 tablet 3   Liraglutide -Weight Management (SAXENDA) 18 MG/3ML SOPN Inject 3 mg into the skin daily. 15 mL 1   LORazepam (ATIVAN) 1 MG tablet TAKE ONE TABLET BY MOUTH THREE TIMES A DAY AS NEEDED FOR ANXIETY 90 tablet 0   mupirocin ointment (BACTROBAN) 2 % Apply 1 application topically 2 (two) times daily. 22 g 0   QUEtiapine (SEROQUEL) 25 MG tablet TAKE 1 TABLET(25 MG) BY MOUTH TWICE DAILY 180 tablet 3   No current facility-administered medications for this visit.    Medication Side Effects: None  Allergies:  Allergies  Allergen Reactions   Ceclor [Cefaclor] Hives    Age 89 mild rash Tolerates ceftriaxone   Ondansetron     Per mother. Patient has serotonin syndrome and can not have Zofran.     Past Medical History:  Diagnosis Date   ADHD (attention deficit hyperactivity disorder)    Anxiety    Panic attacks    Serotonin syndrome    Vaginal Pap smear, abnormal     Past  Medical History, Surgical history, Social history, and Family history were reviewed and updated as appropriate.   Please see review of systems for further details on the patient's review from today.   Objective:   Physical Exam:  There were no vitals taken for this visit.  Physical Exam Constitutional:      General: She is not in acute distress. Musculoskeletal:        General: No deformity.  Neurological:     Mental Status: She is  alert and oriented to person, place, and time.     Coordination: Coordination normal.  Psychiatric:        Attention and Perception: Attention and perception normal. She does not perceive auditory or visual hallucinations.        Mood and Affect: Mood normal. Mood is not anxious or depressed. Affect is not labile, blunt, angry or inappropriate.        Speech: Speech normal.        Behavior: Behavior normal.        Thought Content: Thought content normal. Thought content is not paranoid or delusional. Thought content does not include homicidal or suicidal ideation. Thought content does not include homicidal or suicidal plan.        Cognition and Memory: Cognition and memory normal.        Judgment: Judgment normal.     Comments: Insight intact     Lab Review:     Component Value Date/Time   NA 140 05/01/2022 1142   K 4.9 05/01/2022 1142   CL 99 05/01/2022 1142   CO2 26 05/01/2022 1142   GLUCOSE 68 (L) 05/01/2022 1142   GLUCOSE 140 (H) 08/30/2020 1050   BUN 12 05/01/2022 1142   CREATININE 0.80 05/01/2022 1142   CALCIUM 10.3 (H) 05/01/2022 1142   PROT 8.0 05/01/2022 1142   ALBUMIN 5.1 (H) 05/01/2022 1142   AST 15 05/01/2022 1142   ALT 16 05/01/2022 1142   ALKPHOS 117 05/01/2022 1142   BILITOT 0.7 05/01/2022 1142   GFRNONAA >60 08/30/2020 1050   GFRAA 133 06/29/2017 1224       Component Value Date/Time   WBC 8.0 05/01/2022 1142   WBC 11.7 (H) 08/30/2020 1050   RBC 4.85 05/01/2022 1142   RBC 4.90 08/30/2020 1050   HGB 14.5 05/01/2022 1142   HGB 12.7 06/06/2015 0000   HCT 42.5 05/01/2022 1142   HCT 37 06/06/2015 0000   PLT 485 (H) 05/01/2022 1142   PLT 300 06/06/2015 0000   MCV 88 05/01/2022 1142   MCH 29.9 05/01/2022 1142   MCH 29.6 08/30/2020 1050   MCHC 34.1 05/01/2022 1142   MCHC 33.0 08/30/2020 1050   RDW 11.6 (L) 05/01/2022 1142   LYMPHSABS 1.9 08/30/2020 1050   LYMPHSABS 1.7 06/29/2017 1224   MONOABS 0.6 08/30/2020 1050   EOSABS 0.0 08/30/2020 1050   EOSABS  0.2 06/29/2017 1224   BASOSABS 0.1 08/30/2020 1050   BASOSABS 0.0 06/29/2017 1224    No results found for: "POCLITH", "LITHIUM"   No results found for: "PHENYTOIN", "PHENOBARB", "VALPROATE", "CBMZ"   .res Assessment: Plan:    Plan:  PDMP reviewed  Current medications:  1. Lorazepam 1mg  TID  2. Seroquel 25mg  - 2 at bedtime - taking one at bedtime. 3. Buspar 10mg  BID to 15mg  TID 4. Hydroxyzine 10mg  TID to 25mg  TID  Seeing therapist - .  Time spent with patient was 25 minutes. Greater than 50% of face to face time with patient was spent  on counseling and coordination of care. We discussed     RTC 3 months  Patient advised to contact office with any questions, adverse effects, or acute worsening in signs and symptoms.  Discussed potential metabolic side effects associated with atypical antipsychotics, as well as potential risk for movement side effects. Advised pt to contact office if movement side effects occur.   Discussed potential benefits, risk, and side effects of benzodiazepines to include potential risk of tolerance and dependence, as well as possible drowsiness.  Advised patient not to drive if experiencing drowsiness and to take lowest possible effective dose to minimize risk of dependence and tolerance. There are no diagnoses linked to this encounter.   Please see After Visit Summary for patient specific instructions.  Future Appointments  Date Time Provider Department Center  07/03/2022 10:00 AM Waldron Session, [facility] None  09/22/2022 10:00 AM Arnette Felts, FNP [facility] None    No orders of the defined types were placed in this encounter.   -------------------------------

## 2022-07-03 ENCOUNTER — Ambulatory Visit (INDEPENDENT_AMBULATORY_CARE_PROVIDER_SITE_OTHER): Payer: BC Managed Care – PPO | Admitting: Mental Health

## 2022-07-03 DIAGNOSIS — F411 Generalized anxiety disorder: Secondary | ICD-10-CM

## 2022-07-03 NOTE — Progress Notes (Signed)
[facility]otherapy Note  Name: ZAIDAH BRISKY Date: 07/03/22 MRN: [id number] DOB: [birth year] PCP: Arnette Felts, FNP  Time spent: 46 minutes  Treatment:  Individual therapy   Mental Status Exam:    Appearance:    Casual     Behavior:   Appropriate  Motor:   WNL  Speech/Language:    Clear and Coherent  Affect:   Full range  Mood:   Anxious, pleasant  Thought process:   normal  Thought content:     WNL  Sensory/Perceptual disturbances:     none  Orientation:   x4  Attention:   Good  Concentration:   Good  Memory:   Intact  Fund of knowledge:    Consistent with age and development  Insight:     developing  Judgment:    Good  Impulse Control:   Good    Reported Symptoms:  Appetite disturbance, sleep disturbance, anxiety (8/10), depression (8/10), low motivation, isolative  Risk Assessment: Danger to Self:  No Self-injurious Behavior: No Danger to Others: No Duty to Warn:no Physical Aggression / Violence:No  Access to Firearms a concern: No  Gang Involvement:No  Patient / guardian was educated about steps to take if suicide or homicide risk level increases between visits:   While future psychiatric events cannot be accurately predicted, the patient does not currently require acute inpatient psychiatric care and does not currently meet [facility] involuntary commitment criteria.    Medications: Current Outpatient Medications  Medication Sig Dispense Refill   busPIRone (BUSPAR) 15 MG tablet Take one tablet three times daily. 90 tablet 2   hydrochlorothiazide (HYDRODIURIL) 12.5 MG tablet Take 1 tablet (12.5 mg total) by mouth daily as needed. 30 tablet 2   hydrOXYzine (ATARAX) 25 MG tablet Take 1 tablet (25 mg total) by mouth 3 (three) times daily as needed. 90 tablet 2   Liraglutide -Weight Management (SAXENDA) 18 MG/3ML SOPN Inject 3 mg into the skin daily. 15 mL 1   LORazepam (ATIVAN) 1 MG tablet Take 1 tablet (1 mg total) by mouth 3 (three) times daily as  needed. for anxiety 90 tablet 2   mupirocin ointment (BACTROBAN) 2 % Apply 1 application topically 2 (two) times daily. 22 g 0   QUEtiapine (SEROQUEL) 25 MG tablet TAKE 1 TABLET(25 MG) BY MOUTH TWICE DAILY 180 tablet 3   No current facility-administered medications for this visit.    Subjective:  Patient presents for session on time.  She shared changes in events since last visit.  She stated that her father has returned to this area after living out of state while he was receiving treatment for his addiction issues which she reports have been chronic over the past several years.  She expressed worry and concern related to his potentially relapsing, him having his addiction issues in this area she stated is the primary component of her concern as she is concerned about his having access.  She is mentally trying to prepare herself for this possibility while also trying to be somewhat supportive while also identifying the need to set some boundaries which were explored collaboratively in session.  Facilitated her further identifying thoughts and feelings related to her father returning, this being a rare instance of her experiencing him being sober.  She and her husband remain separated and are currently in mediation.  She stated that she continues to struggle financially, relying on her uncle to pay many of the bills each month, her husband inconsistently giving her money which she hopes  will be clarified in the mediation process.  She stated also that she does not have time to find employment as she is providing primary care for her children, that he does have him stay with him overnight that he may spend just a few hours with him every week or two. She shared a recent incident where they in disagreement related to his wanting to have her along with the children spend time with his parents due to it being a birthday.  She stated she was not comfortable at this point due to their being separated and also  that he did not give them any notice, asking the day of and it being on a school night.  She stated he can easily get upset when not given his way, going on to criticize her verbally, cursing at her.  We explored how she is coping during these moments, ways she continues to attempt to set some boundaries particularly focusing on not having their children here the comments.  Interventions:  mot. Interviewing, supportive therapy  Diagnoses:  No diagnosis found.   Plan: Patient is to utilize coping skills as discussed in session, utilize support system.  Long-term goal:   Reduce overall level, frequency, and intensity of the feelings of depression and anxiety 8-9/10 to a 0-2/10 in severity for at least 3 consecutive months per patient report   Short-term goal:  Increase ability to relax by identifying activities, such as spending time with friends. Increase functioning daily evidenced by such tasks as making lunch for her kids, cleaning when needed, achieving at least 3 tasks per/day Maintain healthy sleep hygiene, getting adequate rest Improve coping skills as identified in session to decrease panic attacks, depression symptoms. Comply with court requirements to decrease stress and anxiety     Assessment of progress:  progressing   [place], [facility]

## 2022-07-27 DIAGNOSIS — F419 Anxiety disorder, unspecified: Secondary | ICD-10-CM | POA: Diagnosis not present

## 2022-07-27 DIAGNOSIS — F410 Panic disorder [episodic paroxysmal anxiety] without agoraphobia: Secondary | ICD-10-CM | POA: Diagnosis not present

## 2022-07-31 ENCOUNTER — Encounter: Payer: Self-pay | Admitting: Nurse Practitioner

## 2022-07-31 ENCOUNTER — Ambulatory Visit: Payer: BC Managed Care – PPO | Admitting: Nurse Practitioner

## 2022-08-12 ENCOUNTER — Ambulatory Visit: Payer: BC Managed Care – PPO | Admitting: Nurse Practitioner

## 2022-08-13 ENCOUNTER — Ambulatory Visit: Payer: BC Managed Care – PPO | Admitting: Nurse Practitioner

## 2022-08-20 ENCOUNTER — Encounter: Payer: Self-pay | Admitting: Nurse Practitioner

## 2022-09-22 ENCOUNTER — Encounter: Payer: BC Managed Care – PPO | Admitting: Nurse Practitioner

## 2022-09-24 ENCOUNTER — Encounter (INDEPENDENT_AMBULATORY_CARE_PROVIDER_SITE_OTHER): Payer: Self-pay

## 2022-10-02 ENCOUNTER — Telehealth: Payer: Self-pay | Admitting: Adult Health

## 2022-10-02 ENCOUNTER — Other Ambulatory Visit: Payer: Self-pay

## 2022-10-02 DIAGNOSIS — F411 Generalized anxiety disorder: Secondary | ICD-10-CM

## 2022-10-02 NOTE — Telephone Encounter (Signed)
We can send in enough until next appointment.

## 2022-10-02 NOTE — Telephone Encounter (Signed)
Pt lvm that she needs a refill on her lorazapam to [facility] on [address]

## 2022-10-02 NOTE — Telephone Encounter (Signed)
Please schedule appt

## 2022-10-03 MED ORDER — LORAZEPAM 1 MG PO TABS: 1.0000 mg | ORAL_TABLET | Freq: Three times a day (TID) | ORAL | 0 refills | Status: DC | PRN

## 2022-10-03 NOTE — Telephone Encounter (Signed)
Quantity changed to 39

## 2022-10-03 NOTE — Telephone Encounter (Signed)
Appt 1/31

## 2022-10-03 NOTE — Telephone Encounter (Signed)
We can send in enough until appt.

## 2022-10-03 NOTE — Telephone Encounter (Signed)
Pt made an appt afor 10/15/22 and also made an appointment

## 2022-10-03 NOTE — Telephone Encounter (Signed)
Please schedule appt,sarah informed me however pt has a balance

## 2022-10-07 NOTE — Telephone Encounter (Signed)
Pt has an appt on 10/15/22

## 2022-10-15 ENCOUNTER — Ambulatory Visit (INDEPENDENT_AMBULATORY_CARE_PROVIDER_SITE_OTHER): Payer: BC Managed Care – PPO | Admitting: Adult Health

## 2022-10-15 ENCOUNTER — Encounter: Payer: Self-pay | Admitting: Adult Health

## 2022-10-15 DIAGNOSIS — F411 Generalized anxiety disorder: Secondary | ICD-10-CM

## 2022-10-15 DIAGNOSIS — F428 Other obsessive-compulsive disorder: Secondary | ICD-10-CM

## 2022-10-15 DIAGNOSIS — F909 Attention-deficit hyperactivity disorder, unspecified type: Secondary | ICD-10-CM

## 2022-10-15 DIAGNOSIS — F331 Major depressive disorder, recurrent, moderate: Secondary | ICD-10-CM

## 2022-10-15 MED ORDER — LORAZEPAM 1 MG PO TABS: 1.0000 mg | ORAL_TABLET | Freq: Three times a day (TID) | ORAL | 0 refills | Status: DC | PRN

## 2022-10-15 MED ORDER — QUETIAPINE FUMARATE 25 MG PO TABS: ORAL_TABLET | ORAL | 3 refills | Status: DC

## 2022-10-15 NOTE — Progress Notes (Signed)
[name] [name] [id number] [birth year] 32 y.o.  Subjective:   Patient ID:  [name] [name] is a 32 y.o. (DOB 1990-11-21) female.  Chief Complaint: No chief complaint on file.   HPI [name] [name] presents to the office today for follow-up of MDD, GAD, obsessional thoughts and ADHD.  Describes mood today as "ok". Pleasant. Tearful at times. Mood symptoms - reports decreased anxiety, depression, and irritability. Reports some worry, rumination, and over thinking. Stating "I'm dealing with things better than I have in the past". Reports panic attacks - decreased over past few weeks - taking Ativan three times daily. Mood is more consistent - "predictable". Feels like medications are helpful. Daughters doing well. Has supportive family and friends. Improved interest and motivation.Seeing Lanetta Inch - therapist. Energy levels improved. Active, has a regular exercise routine. Enjoys some usual interests and activities. Married, but separated. Lives with 2 daughters. Family and friends local and supportive.  Appetite adequate. Weight gain. Sleeps well most nights. Averages 9 to 10 hours.  Focus and concentration difficulties - "coping better". Completing tasks. Managing some aspects of household. Unemployed. Denies SI or HI.  Denies AH or VH. Denies self harm. Denies substance use.  Previous medication trials: Adderall, Clonazepam, Lorazepam, Trazadone, Lexapro, Paxil, Zoloft.    [place] Office Visit from 05/01/2022 in [facility] Office Visit from 03/26/2021 in [facility] Video Visit from 01/03/2020 in Primary Care at [place] from 05/16/2019 in [place] at [place] from 01/14/2019 in [place] at [facility] Total Score 0 0 0 0 0        Review of Systems:  Review of Systems  Musculoskeletal:  Negative for gait problem.  Neurological:  Negative for tremors.   Psychiatric/Behavioral:         Please refer to HPI    Medications: I have reviewed the patient's current medications.  Current Outpatient Medications  Medication Sig Dispense Refill   busPIRone (BUSPAR) 15 MG tablet Take one tablet three times daily. 90 tablet 2   hydrochlorothiazide (HYDRODIURIL) 12.5 MG tablet Take 1 tablet (12.5 mg total) by mouth daily as needed. 30 tablet 2   hydrOXYzine (ATARAX) 25 MG tablet Take 1 tablet (25 mg total) by mouth 3 (three) times daily as needed. 90 tablet 2   Liraglutide -Weight Management (SAXENDA) 18 MG/3ML SOPN Inject 3 mg into the skin daily. 15 mL 1   LORazepam (ATIVAN) 1 MG tablet Take 1 tablet (1 mg total) by mouth 3 (three) times daily as needed. for anxiety 90 tablet 0   mupirocin ointment (BACTROBAN) 2 % Apply 1 application topically 2 (two) times daily. 22 g 0   QUEtiapine (SEROQUEL) 25 MG tablet TAKE 1 TABLET(25 MG) BY MOUTH TWICE DAILY 180 tablet 3   No current facility-administered medications for this visit.    Medication Side Effects: None  Allergies:  Allergies  Allergen Reactions   Ceclor [Cefaclor] Hives    Age 31 mild rash Tolerates ceftriaxone   Ondansetron     Per mother. Patient has serotonin syndrome and can not have Zofran.     Past Medical History:  Diagnosis Date   ADHD (attention deficit hyperactivity disorder)    Anxiety    Panic attacks    Serotonin syndrome    Vaginal Pap smear, abnormal     Past Medical History, Surgical history, Social history, and Family history were reviewed and updated as appropriate.  Please see review of systems for further details on the patient's review from today.   Objective:   Physical Exam:  There were no vitals taken for this visit.  Physical Exam Constitutional:      General: She is not in acute distress. Musculoskeletal:        General: No deformity.  Neurological:     Mental Status: She is alert and oriented to person, place, and time.     Coordination:  Coordination normal.  Psychiatric:        Attention and Perception: Attention and perception normal. She does not perceive auditory or visual hallucinations.        Mood and Affect: Mood normal. Mood is not anxious or depressed. Affect is not labile, blunt, angry or inappropriate.        Speech: Speech normal.        Behavior: Behavior normal.        Thought Content: Thought content normal. Thought content is not paranoid or delusional. Thought content does not include homicidal or suicidal ideation. Thought content does not include homicidal or suicidal plan.        Cognition and Memory: Cognition and memory normal.        Judgment: Judgment normal.     Comments: Insight intact     Lab Review:     Component Value Date/Time   NA 140 05/01/2022 1142   K 4.9 05/01/2022 1142   CL 99 05/01/2022 1142   CO2 26 05/01/2022 1142   GLUCOSE 68 (L) 05/01/2022 1142   GLUCOSE 140 (H) 08/30/2020 1050   BUN 12 05/01/2022 1142   CREATININE 0.80 05/01/2022 1142   CALCIUM 10.3 (H) 05/01/2022 1142   PROT 8.0 05/01/2022 1142   ALBUMIN 5.1 (H) 05/01/2022 1142   AST 15 05/01/2022 1142   ALT 16 05/01/2022 1142   ALKPHOS 117 05/01/2022 1142   BILITOT 0.7 05/01/2022 1142   GFRNONAA >60 08/30/2020 1050   GFRAA 133 06/29/2017 1224       Component Value Date/Time   WBC 8.0 05/01/2022 1142   WBC 11.7 (H) 08/30/2020 1050   RBC 4.85 05/01/2022 1142   RBC 4.90 08/30/2020 1050   HGB 14.5 05/01/2022 1142   HGB 12.7 06/06/2015 0000   HCT 42.5 05/01/2022 1142   HCT 37 06/06/2015 0000   PLT 485 (H) 05/01/2022 1142   PLT 300 06/06/2015 0000   MCV 88 05/01/2022 1142   MCH 29.9 05/01/2022 1142   MCH 29.6 08/30/2020 1050   MCHC 34.1 05/01/2022 1142   MCHC 33.0 08/30/2020 1050   RDW 11.6 (L) 05/01/2022 1142   LYMPHSABS 1.9 08/30/2020 1050   LYMPHSABS 1.7 06/29/2017 1224   MONOABS 0.6 08/30/2020 1050   EOSABS 0.0 08/30/2020 1050   EOSABS 0.2 06/29/2017 1224   BASOSABS 0.1 08/30/2020 1050   BASOSABS  0.0 06/29/2017 1224    No results found for: "POCLITH", "LITHIUM"   No results found for: "PHENYTOIN", "PHENOBARB", "VALPROATE", "CBMZ"   .res Assessment: Plan:    Plan:  PDMP reviewed  Current medications:  1. Lorazepam 1mg  TID  2. Seroquel 25mg  - taking one at bedtime. 3. Buspar 10mg  BID to 20mg  BID 4. Hydroxyzine 25mg  TID  Seeing therapist - Lanetta Inch.  Time spent with patient was 25 minutes. Greater than 50% of face to face time with patient was spent on counseling and coordination of care. We discussed     RTC 3 months  Patient advised to contact office with any questions,  adverse effects, or acute worsening in signs and symptoms.  Discussed potential metabolic side effects associated with atypical antipsychotics, as well as potential risk for movement side effects. Advised pt to contact office if movement side effects occur.   Discussed potential benefits, risk, and side effects of benzodiazepines to include potential risk of tolerance and dependence, as well as possible drowsiness.  Advised patient not to drive if experiencing drowsiness and to take lowest possible effective dose to minimize risk of dependence and tolerance.  Diagnoses and all orders for this visit:  Major depressive disorder, recurrent episode, moderate (HCC) -     QUEtiapine (SEROQUEL) 25 MG tablet; TAKE 1 TABLET(25 MG) BY MOUTH TWICE DAILY  Generalized anxiety disorder -     LORazepam (ATIVAN) 1 MG tablet; Take 1 tablet (1 mg total) by mouth 3 (three) times daily as needed. for anxiety  Attention deficit hyperactivity disorder (ADHD), unspecified ADHD type  Obsessional thoughts     Please see After Visit Summary for patient specific instructions.  No future appointments.   No orders of the defined types were placed in this encounter.   -------------------------------

## 2022-11-10 ENCOUNTER — Other Ambulatory Visit: Payer: Self-pay | Admitting: Adult Health

## 2022-11-10 DIAGNOSIS — F411 Generalized anxiety disorder: Secondary | ICD-10-CM

## 2022-12-15 ENCOUNTER — Other Ambulatory Visit: Payer: Self-pay | Admitting: Adult Health

## 2022-12-15 DIAGNOSIS — F411 Generalized anxiety disorder: Secondary | ICD-10-CM

## 2023-01-15 ENCOUNTER — Other Ambulatory Visit: Payer: Self-pay | Admitting: Adult Health

## 2023-01-15 DIAGNOSIS — F411 Generalized anxiety disorder: Secondary | ICD-10-CM

## 2023-01-15 NOTE — Telephone Encounter (Signed)
Sent MyChart message to schedule appt.

## 2023-01-20 ENCOUNTER — Telehealth: Payer: Self-pay | Admitting: Adult Health

## 2023-01-20 NOTE — Telephone Encounter (Signed)
[name] [name] called at 1:44 to check status of her refill of Lorazepam.  She made an last week appt for 6/3. Please send to [facility] [phone] [place], [place] - [address]

## 2023-01-20 NOTE — Telephone Encounter (Signed)
The 6/3 appt is showing it is with Thayer Ohm.  She still needs appt with Almira Coaster.

## 2023-01-20 NOTE — Telephone Encounter (Signed)
Now also has appt with Almira Coaster on 5/14

## 2023-01-20 NOTE — Telephone Encounter (Signed)
Rx pended

## 2023-01-27 ENCOUNTER — Ambulatory Visit (INDEPENDENT_AMBULATORY_CARE_PROVIDER_SITE_OTHER): Payer: Self-pay | Admitting: Adult Health

## 2023-01-27 DIAGNOSIS — Z0389 Encounter for observation for other suspected diseases and conditions ruled out: Secondary | ICD-10-CM

## 2023-01-27 NOTE — Progress Notes (Signed)
Patient no show appointment. ? ?

## 2023-02-02 ENCOUNTER — Telehealth: Payer: Self-pay | Admitting: Adult Health

## 2023-02-02 ENCOUNTER — Other Ambulatory Visit: Payer: Self-pay | Admitting: Adult Health

## 2023-02-02 DIAGNOSIS — F411 Generalized anxiety disorder: Secondary | ICD-10-CM

## 2023-02-02 MED ORDER — LORAZEPAM 1 MG PO TABS: 1.0000 mg | ORAL_TABLET | Freq: Three times a day (TID) | ORAL | 3 refills | Status: DC | PRN

## 2023-02-02 NOTE — Telephone Encounter (Signed)
PT made a payment of 5.00. She made an appt for 5/31. Please send in more lorazapam to the [facility] on [address]

## 2023-02-02 NOTE — Telephone Encounter (Signed)
Script sent  

## 2023-02-13 ENCOUNTER — Ambulatory Visit (INDEPENDENT_AMBULATORY_CARE_PROVIDER_SITE_OTHER): Payer: Self-pay | Admitting: Adult Health

## 2023-02-13 DIAGNOSIS — Z0389 Encounter for observation for other suspected diseases and conditions ruled out: Secondary | ICD-10-CM

## 2023-02-13 NOTE — Progress Notes (Signed)
Patient no show appointment. ? ?

## 2023-02-16 ENCOUNTER — Ambulatory Visit: Payer: Self-pay | Admitting: Mental Health

## 2024-06-27 ENCOUNTER — Encounter: Payer: Self-pay | Admitting: Family Medicine

## 2024-06-27 ENCOUNTER — Ambulatory Visit (INDEPENDENT_AMBULATORY_CARE_PROVIDER_SITE_OTHER): Payer: MEDICAID | Admitting: Family Medicine

## 2024-06-27 VITALS — BP 128/76 | HR 85 | Temp 98.3°F | Ht 64.8 in | Wt 220.0 lb

## 2024-06-27 DIAGNOSIS — Z23 Encounter for immunization: Secondary | ICD-10-CM | POA: Diagnosis not present

## 2024-06-27 DIAGNOSIS — Z136 Encounter for screening for cardiovascular disorders: Secondary | ICD-10-CM

## 2024-06-27 DIAGNOSIS — E559 Vitamin D deficiency, unspecified: Secondary | ICD-10-CM | POA: Diagnosis not present

## 2024-06-27 DIAGNOSIS — E88819 Insulin resistance, unspecified: Secondary | ICD-10-CM | POA: Diagnosis not present

## 2024-06-27 DIAGNOSIS — E66812 Obesity, class 2: Secondary | ICD-10-CM | POA: Diagnosis not present

## 2024-06-27 DIAGNOSIS — Z6836 Body mass index (BMI) 36.0-36.9, adult: Secondary | ICD-10-CM

## 2024-06-27 DIAGNOSIS — F411 Generalized anxiety disorder: Secondary | ICD-10-CM

## 2024-06-27 DIAGNOSIS — Z79899 Other long term (current) drug therapy: Secondary | ICD-10-CM | POA: Diagnosis not present

## 2024-06-27 DIAGNOSIS — F902 Attention-deficit hyperactivity disorder, combined type: Secondary | ICD-10-CM | POA: Diagnosis not present

## 2024-06-27 DIAGNOSIS — R635 Abnormal weight gain: Secondary | ICD-10-CM

## 2024-06-27 DIAGNOSIS — E538 Deficiency of other specified B group vitamins: Secondary | ICD-10-CM

## 2024-06-27 LAB — VITAMIN B12: Vitamin B-12: 293 pg/mL (ref 211–911)

## 2024-06-27 LAB — CBC WITH DIFFERENTIAL/PLATELET
Basophils Absolute: 0.1 K/uL (ref 0.0–0.1)
Basophils Relative: 0.7 % (ref 0.0–3.0)
Eosinophils Absolute: 0.2 K/uL (ref 0.0–0.7)
Eosinophils Relative: 2.1 % (ref 0.0–5.0)
HCT: 42.8 % (ref 36.0–46.0)
Hemoglobin: 14.4 g/dL (ref 12.0–15.0)
Lymphocytes Relative: 25.5 % (ref 12.0–46.0)
Lymphs Abs: 2.4 K/uL (ref 0.7–4.0)
MCHC: 33.7 g/dL (ref 30.0–36.0)
MCV: 92.5 fl (ref 78.0–100.0)
Monocytes Absolute: 0.5 K/uL (ref 0.1–1.0)
Monocytes Relative: 5.6 % (ref 3.0–12.0)
Neutro Abs: 6.3 K/uL (ref 1.4–7.7)
Neutrophils Relative %: 66.1 % (ref 43.0–77.0)
Platelets: 431 K/uL — ABNORMAL HIGH (ref 150.0–400.0)
RBC: 4.62 Mil/uL (ref 3.87–5.11)
RDW: 13.5 % (ref 11.5–15.5)
WBC: 9.5 K/uL (ref 4.0–10.5)

## 2024-06-27 LAB — COMPREHENSIVE METABOLIC PANEL WITH GFR
ALT: 14 U/L (ref 0–35)
AST: 17 U/L (ref 0–37)
Albumin: 4.9 g/dL (ref 3.5–5.2)
Alkaline Phosphatase: 90 U/L (ref 39–117)
BUN: 9 mg/dL (ref 6–23)
CO2: 30 meq/L (ref 19–32)
Calcium: 9.2 mg/dL (ref 8.4–10.5)
Chloride: 101 meq/L (ref 96–112)
Creatinine, Ser: 0.92 mg/dL (ref 0.40–1.20)
GFR: 81.12 mL/min (ref >60.00)
Glucose, Bld: 102 mg/dL — ABNORMAL HIGH (ref 70–99)
Potassium: 4 meq/L (ref 3.5–5.1)
Sodium: 138 meq/L (ref 135–145)
Total Bilirubin: 0.6 mg/dL (ref 0.2–1.2)
Total Protein: 8.2 g/dL (ref 6.0–8.3)

## 2024-06-27 LAB — LIPID PANEL
Cholesterol: 217 mg/dL — ABNORMAL HIGH (ref 0–200)
HDL: 44.6 mg/dL (ref >39.00)
LDL Cholesterol: 142 mg/dL — ABNORMAL HIGH (ref 0–99)
NonHDL: 172.04
Total CHOL/HDL Ratio: 5
Triglycerides: 152 mg/dL — ABNORMAL HIGH (ref 0.0–149.0)
VLDL: 30.4 mg/dL (ref 0.0–40.0)

## 2024-06-27 LAB — TSH: TSH: 2.18 u[IU]/mL (ref 0.35–5.50)

## 2024-06-27 LAB — HEMOGLOBIN A1C: Hgb A1c MFr Bld: 5.4 % (ref 4.6–6.5)

## 2024-06-27 LAB — VITAMIN D 25 HYDROXY (VIT D DEFICIENCY, FRACTURES): VITD: 17.59 ng/mL — ABNORMAL LOW (ref 30.00–100.00)

## 2024-06-27 NOTE — Patient Instructions (Signed)
 Welcome to [facility]!  Thank you for choosing us  for your Primary Care needs.   We offer in person and video appointments for your convenience. You may call our office to schedule appointments, or you may schedule appointments with me through MyChart.   The best way to get in contact with me is via MyChart message. This will get to me faster than a phone call, unless there is an emergency, then please call 911.  The lab is located downstairs in the Sports Medicine building, we also have xray available there.   We are checking labs today, will be in contact with any results that require further attention  We have given your flu vaccine today.

## 2024-06-27 NOTE — Progress Notes (Signed)
 New Patient Visit  Subjective:     Patient ID: [name] [name], female    DOB: 1991-02-27, 33 y.o.   MRN: [id number]  No chief complaint on file.   HPI  Discussed the use of AI scribe software for clinical note transcription with the patient, who gave verbal consent to proceed.  History of Present Illness [name] [name] is a 33 year old with insulin  resistance who presents with concerns of weight gain.  Weight gain - Significant weight gain following achievement of sobriety and discontinuation of methadone - Gradual weight gain after discontinuation of Metformin  and Mounjaro  due to loss of insurance coverage - Continued weight gain despite efforts with diet and exercise - Concern that weight gain may be related to insulin  resistance  Insulin  resistance - History of insulin  resistance - Previous weight loss with Metformin  and Mounjaro  - No current pharmacologic therapy for insulin  resistance due to insurance limitations  Exercise  - Physical activity includes walking for about an hour, often resulting in sweating - Easily winded and lacks energy for more intensive workouts  Dietary habits - Diet consists of convenience foods and meals shared with her children - Preference for sweet foods and soda - Acknowledges need for improvement in cooking habits, especially with children returning to school  Sleep patterns - Sleep from 9 PM to 5 AM - Occasional interruptions in sleep due to children - Generally sleeps well  Psychosocial factors - Lifestyle changes following divorce and challenges of being a single mother contribute to weight gain     ROS Per HPI  Outpatient Encounter Medications as of 06/27/2024  Medication Sig   [DISCONTINUED] busPIRone  (BUSPAR ) 15 MG tablet Take one tablet three times daily.   [DISCONTINUED] hydrochlorothiazide  (HYDRODIURIL ) 12.5 MG tablet Take 1 tablet (12.5 mg total) by mouth daily as needed.   [DISCONTINUED] hydrOXYzine  (ATARAX ) 25 MG  tablet Take 1 tablet (25 mg total) by mouth 3 (three) times daily as needed.   [DISCONTINUED] LORazepam  (ATIVAN ) 1 MG tablet Take 1 tablet (1 mg total) by mouth 3 (three) times daily as needed. for anxiety   [DISCONTINUED] mupirocin  ointment (BACTROBAN ) 2 % Apply 1 application topically 2 (two) times daily.   [DISCONTINUED] QUEtiapine  (SEROQUEL ) 25 MG tablet TAKE 1 TABLET(25 MG) BY MOUTH TWICE DAILY   [DISCONTINUED] Liraglutide  -Weight Management (SAXENDA ) 18 MG/3ML SOPN Inject 3 mg into the skin daily. (Patient not taking: Reported on 06/27/2024)   No facility-administered encounter medications on file as of 06/27/2024.    Past Medical History:  Diagnosis Date   ADHD (attention deficit hyperactivity disorder)    Anxiety    Asthma 2018   Panic attacks    Serotonin syndrome    Vaginal Pap smear, abnormal     Past Surgical History:  Procedure Laterality Date   DILATION AND CURETTAGE OF UTERUS  01/04/16   DILATION AND EVACUATION N/A 01/04/2016   Procedure: DILATATION AND EVACUATION;  Surgeon: Alm Cook, MD;  Location: [facility];  Service: Gynecology;  Laterality: N/A;   GUM SURGERY  01/2017   INDUCED ABORTION     wisdom teeth removal Bilateral    2010    Family History  Problem Relation Age of Onset   Hypertension Mother    Heart disease Father    Cancer Father        testicular   Hypertension Father    Hyperlipidemia Father    Heart disease Paternal Grandfather    ADD / ADHD Sister    Anxiety disorder  Sister    Asthma Sister     Social History   Socioeconomic History   Marital status: Married    Spouse name: Not on file   Number of children: Not on file   Years of education: Not on file   Highest education level: Not on file  Occupational History   Not on file  Tobacco Use   Smoking status: Former    Current packs/day: 0.00    Types: Cigarettes    Quit date: 08/15/2013    Years since quitting: 10.8   Smokeless tobacco: Never  Substance and Sexual Activity    Alcohol  use: No    Alcohol /week: 2.0 standard drinks of alcohol     Types: 2 Standard drinks or equivalent per week    Comment: socially, not with preg   Drug use: No   Sexual activity: Not Currently    Birth control/protection: I.U.D.  Other Topics Concern   Not on file  Social History Narrative   Not on file   Social Drivers of Health   Financial Resource Strain: Not on file  Food Insecurity: Not on file  Transportation Needs: Not on file  Physical Activity: Not on file  Stress: Not on file  Social Connections: Not on file  Intimate Partner Violence: Not on file       Objective:    BP 128/76 (BP Location: Left Arm, Patient Position: Sitting)   Pulse 85   Temp 98.3 F (36.8 C) (Temporal)   Ht 5' 4.8 (1.646 m)   Wt 220 lb (99.8 kg)   SpO2 95%   BMI 36.84 kg/m    Physical Exam Vitals and nursing note reviewed.  Constitutional:      General: She is not in acute distress.    Appearance: Normal appearance. She is obese.  HENT:     Head: Normocephalic and atraumatic.     Right Ear: External ear normal.     Left Ear: External ear normal.     Nose: Nose normal.     Mouth/Throat:     Mouth: Mucous membranes are moist.     Pharynx: Oropharynx is clear.  Eyes:     Extraocular Movements: Extraocular movements intact.     Pupils: Pupils are equal, round, and reactive to light.  Cardiovascular:     Rate and Rhythm: Normal rate and regular rhythm.     Pulses: Normal pulses.     Heart sounds: Normal heart sounds.  Pulmonary:     Effort: Pulmonary effort is normal. No respiratory distress.     Breath sounds: Normal breath sounds. No wheezing, rhonchi or rales.  Musculoskeletal:        General: Normal range of motion.     Cervical back: Normal range of motion.     Right lower leg: No edema.     Left lower leg: No edema.  Lymphadenopathy:     Cervical: No cervical adenopathy.  Neurological:     General: No focal deficit present.     Mental Status: She is alert and  oriented to person, place, and time.  Psychiatric:        Mood and Affect: Mood normal.        Thought Content: Thought content normal.     Results for orders placed or performed in visit on 06/27/24  CBC with Differential/Platelet  Result Value Ref Range   WBC 9.5 4.0 - 10.5 K/uL   RBC 4.62 3.87 - 5.11 Mil/uL   Hemoglobin 14.4 12.0 - 15.0  g/dL   HCT 57.1 63.9 - 53.9 %   MCV 92.5 78.0 - 100.0 fl   MCHC 33.7 30.0 - 36.0 g/dL   RDW 86.4 88.4 - 84.4 %   Platelets 431.0 (H) 150.0 - 400.0 K/uL   Neutrophils Relative % 66.1 43.0 - 77.0 %   Lymphocytes Relative 25.5 12.0 - 46.0 %   Monocytes Relative 5.6 3.0 - 12.0 %   Eosinophils Relative 2.1 0.0 - 5.0 %   Basophils Relative 0.7 0.0 - 3.0 %   Neutro Abs 6.3 1.4 - 7.7 K/uL   Lymphs Abs 2.4 0.7 - 4.0 K/uL   Monocytes Absolute 0.5 0.1 - 1.0 K/uL   Eosinophils Absolute 0.2 0.0 - 0.7 K/uL   Basophils Absolute 0.1 0.0 - 0.1 K/uL  Comprehensive metabolic panel with GFR  Result Value Ref Range   Sodium 138 135 - 145 mEq/L   Potassium 4.0 3.5 - 5.1 mEq/L   Chloride 101 96 - 112 mEq/L   CO2 30 19 - 32 mEq/L   Glucose, Bld 102 (H) 70 - 99 mg/dL   BUN 9 6 - 23 mg/dL   Creatinine, Ser 9.07 0.40 - 1.20 mg/dL   Total Bilirubin 0.6 0.2 - 1.2 mg/dL   Alkaline Phosphatase 90 39 - 117 U/L   AST 17 0 - 37 U/L   ALT 14 0 - 35 U/L   Total Protein 8.2 6.0 - 8.3 g/dL   Albumin 4.9 3.5 - 5.2 g/dL   GFR 18.87 >39.99 mL/min   Calcium  9.2 8.4 - 10.5 mg/dL  Hemoglobin J8r  Result Value Ref Range   Hgb A1c MFr Bld 5.4 4.6 - 6.5 %  Lipid panel  Result Value Ref Range   Cholesterol 217 (H) 0 - 200 mg/dL   Triglycerides 847.9 (H) 0.0 - 149.0 mg/dL   HDL 55.39 >60.99 mg/dL   VLDL 69.5 0.0 - 59.9 mg/dL   LDL Cholesterol 857 (H) 0 - 99 mg/dL   Total CHOL/HDL Ratio 5    NonHDL 172.04   Vitamin B12  Result Value Ref Range   Vitamin B-12 293 211 - 911 pg/mL  VITAMIN D 25 Hydroxy (Vit-D Deficiency, Fractures)  Result Value Ref Range   VITD 17.59 (L)  30.00 - 100.00 ng/mL  TSH  Result Value Ref Range   TSH 2.18 0.35 - 5.50 uIU/mL        Assessment & Plan:   Assessment and Plan Assessment & Plan Class 2 Obesity d/t excess calories, BMI 36, with serious comorbidity (GAD), Weight Gain Obesity with recent weight gain post-sobriety. Previous weight loss with Metformin  and Mounjaro  hindered by insurance changes. Lifestyle includes walking; diet requires improvement. Possible underlying metabolic  issues. - Order A1c to assess insulin  resistance/type 2 diabetes.  GAD - chronic, has therapist intermittently as needed  ADHD - Not currently being treated with medications  Screening for lipid disorders - Lipids today  Vitamin D Deficiency, Vitamin B12 Deficiency - Check vitamin D and B12 levels for deficiencies.  Med Mgt - Labs today, will dose adjust medications and vitamins as indicated  Immunization Due - Administer flu shot.     Orders Placed This Encounter  Procedures   Flu vaccine trivalent PF, 6mos and older(Flulaval,Afluria,Fluarix,Fluzone)   CBC with Differential/Platelet    Release to patient:   Immediate [1]   Comprehensive metabolic panel with GFR    Release to patient:   Immediate [1]   Hemoglobin A1c   Lipid panel   Vitamin B12  VITAMIN D 25 Hydroxy (Vit-D Deficiency, Fractures)   TSH     No orders of the defined types were placed in this encounter.   Return for as needed for now, will decide for sure once labs result.  Corean LITTIE Ku, FNP

## 2024-06-29 DIAGNOSIS — E538 Deficiency of other specified B group vitamins: Secondary | ICD-10-CM | POA: Insufficient documentation

## 2024-06-29 DIAGNOSIS — Z136 Encounter for screening for cardiovascular disorders: Secondary | ICD-10-CM | POA: Insufficient documentation

## 2024-06-29 DIAGNOSIS — Z6836 Body mass index (BMI) 36.0-36.9, adult: Secondary | ICD-10-CM | POA: Insufficient documentation

## 2024-06-29 DIAGNOSIS — R635 Abnormal weight gain: Secondary | ICD-10-CM | POA: Insufficient documentation

## 2024-06-29 DIAGNOSIS — Z79899 Other long term (current) drug therapy: Secondary | ICD-10-CM | POA: Insufficient documentation

## 2024-06-29 DIAGNOSIS — Z23 Encounter for immunization: Secondary | ICD-10-CM | POA: Insufficient documentation

## 2024-06-29 DIAGNOSIS — E559 Vitamin D deficiency, unspecified: Secondary | ICD-10-CM | POA: Insufficient documentation

## 2024-06-30 ENCOUNTER — Ambulatory Visit: Payer: Self-pay | Admitting: Family Medicine

## 2024-06-30 DIAGNOSIS — E559 Vitamin D deficiency, unspecified: Secondary | ICD-10-CM

## 2024-06-30 MED ORDER — VITAMIN D (ERGOCALCIFEROL) 1.25 MG (50000 UNIT) PO CAPS: 50000.0000 [IU] | ORAL_CAPSULE | ORAL | 0 refills | Status: AC

## 2024-07-04 NOTE — Progress Notes (Deleted)
   Established Patient Office Visit  Subjective:     Patient ID: MORIYA MITCHELL, female    DOB: 08-15-1991, 33 y.o.   MRN: [id number]  No chief complaint on file.   HPI  Discussed the use of AI scribe software for clinical note transcription with the patient, who gave verbal consent to proceed.  History of Present Illness      ROS Per HPI      Objective:    There were no vitals taken for this visit.   Physical Exam Vitals and nursing note reviewed.  Constitutional:      General: She is not in acute distress.    Appearance: Normal appearance. She is normal weight.  HENT:     Head: Normocephalic and atraumatic.     Right Ear: External ear normal.     Left Ear: External ear normal.     Nose: Nose normal.     Mouth/Throat:     Mouth: Mucous membranes are moist.     Pharynx: Oropharynx is clear.  Eyes:     Extraocular Movements: Extraocular movements intact.     Pupils: Pupils are equal, round, and reactive to light.  Cardiovascular:     Rate and Rhythm: Normal rate and regular rhythm.     Pulses: Normal pulses.     Heart sounds: Normal heart sounds.  Pulmonary:     Effort: Pulmonary effort is normal. No respiratory distress.     Breath sounds: Normal breath sounds. No wheezing, rhonchi or rales.  Musculoskeletal:        General: Normal range of motion.     Cervical back: Normal range of motion.     Right lower leg: No edema.     Left lower leg: No edema.  Lymphadenopathy:     Cervical: No cervical adenopathy.  Neurological:     General: No focal deficit present.     Mental Status: She is alert and oriented to person, place, and time.  Psychiatric:        Mood and Affect: Mood normal.        Thought Content: Thought content normal.     No results found for any visits on 07/05/24.  The ASCVD Risk score (Arnett DK, et al., 2019) failed to calculate for the following reasons:   The 2019 ASCVD risk score is only valid for ages 76 to 69  {Vitals History  (Optional):23777}  {Show previous labs (optional):23779}     Assessment & Plan:   Assessment and Plan Assessment & Plan      No orders of the defined types were placed in this encounter.    No orders of the defined types were placed in this encounter.   No follow-ups on file.  Corean LITTIE Ku, FNP

## 2024-07-05 ENCOUNTER — Ambulatory Visit: Admitting: Family Medicine

## 2024-07-05 NOTE — Progress Notes (Deleted)
   Acute Office Visit  Subjective:     Patient ID: [name] [name], female    DOB: 03-13-1991, 33 y.o.   MRN: [id number]  No chief complaint on file.   HPI  Discussed the use of AI scribe software for clinical note transcription with the patient, who gave verbal consent to proceed.  History of Present Illness      ROS Per HPI      Objective:    There were no vitals taken for this visit.   Physical Exam Vitals and nursing note reviewed.  Constitutional:      General: She is not in acute distress.    Appearance: Normal appearance. She is normal weight.  HENT:     Head: Normocephalic and atraumatic.     Right Ear: External ear normal.     Left Ear: External ear normal.     Nose: Nose normal.     Mouth/Throat:     Mouth: Mucous membranes are moist.     Pharynx: Oropharynx is clear.  Eyes:     Extraocular Movements: Extraocular movements intact.     Pupils: Pupils are equal, round, and reactive to light.  Cardiovascular:     Rate and Rhythm: Normal rate and regular rhythm.     Pulses: Normal pulses.     Heart sounds: Normal heart sounds.  Pulmonary:     Effort: Pulmonary effort is normal. No respiratory distress.     Breath sounds: Normal breath sounds. No wheezing, rhonchi or rales.  Musculoskeletal:        General: Normal range of motion.     Cervical back: Normal range of motion.     Right lower leg: No edema.     Left lower leg: No edema.  Lymphadenopathy:     Cervical: No cervical adenopathy.  Neurological:     General: No focal deficit present.     Mental Status: She is alert and oriented to person, place, and time.  Psychiatric:        Mood and Affect: Mood normal.        Thought Content: Thought content normal.     No results found for any visits on 07/07/24.      Assessment & Plan:   Assessment and Plan Assessment & Plan      No orders of the defined types were placed in this encounter.    No orders of the defined types were placed  in this encounter.   No follow-ups on file.  Corean LITTIE Ku, FNP

## 2024-07-07 ENCOUNTER — Ambulatory Visit: Admitting: Family Medicine

## 2024-07-11 ENCOUNTER — Telehealth: Payer: Self-pay

## 2024-07-11 ENCOUNTER — Ambulatory Visit: Payer: MEDICAID | Admitting: Family Medicine

## 2024-07-11 NOTE — Progress Notes (Deleted)
   Acute Office Visit  Subjective:     Patient ID: [name] [name], female    DOB: 07-09-1991, 33 y.o.   MRN: [id number]  No chief complaint on file.   HPI  Discussed the use of AI scribe software for clinical note transcription with the patient, who gave verbal consent to proceed.  History of Present Illness      ROS Per HPI      Objective:    There were no vitals taken for this visit.   Physical Exam Vitals and nursing note reviewed.  Constitutional:      General: She is not in acute distress.    Appearance: Normal appearance. She is normal weight.  HENT:     Head: Normocephalic and atraumatic.     Right Ear: External ear normal.     Left Ear: External ear normal.     Nose: Nose normal.     Mouth/Throat:     Mouth: Mucous membranes are moist.     Pharynx: Oropharynx is clear.  Eyes:     Extraocular Movements: Extraocular movements intact.     Pupils: Pupils are equal, round, and reactive to light.  Cardiovascular:     Rate and Rhythm: Normal rate and regular rhythm.     Pulses: Normal pulses.     Heart sounds: Normal heart sounds.  Pulmonary:     Effort: Pulmonary effort is normal. No respiratory distress.     Breath sounds: Normal breath sounds. No wheezing, rhonchi or rales.  Musculoskeletal:        General: Normal range of motion.     Cervical back: Normal range of motion.     Right lower leg: No edema.     Left lower leg: No edema.  Lymphadenopathy:     Cervical: No cervical adenopathy.  Neurological:     General: No focal deficit present.     Mental Status: She is alert and oriented to person, place, and time.  Psychiatric:        Mood and Affect: Mood normal.        Thought Content: Thought content normal.     No results found for any visits on 07/11/24.      Assessment & Plan:   Assessment and Plan Assessment & Plan      No orders of the defined types were placed in this encounter.    No orders of the defined types were placed  in this encounter.   No follow-ups on file.  Corean LITTIE Ku, FNP

## 2024-07-11 NOTE — Telephone Encounter (Signed)
 Copied from CRM [id number]. Topic: Appointments - Appointment Cancel/Reschedule >> Jul 11, 2024  9:50 AM Barbi H wrote: Patient/patient representative is calling to cancel or reschedule an appointment. Refer to attachments for appointment information.    Cancelled appt for today, patient already rescheduled via my chart.  unable to come in - sick.

## 2024-07-14 ENCOUNTER — Ambulatory Visit: Payer: MEDICAID | Admitting: Family Medicine

## 2024-07-19 ENCOUNTER — Ambulatory Visit: Payer: MEDICAID | Admitting: Family Medicine

## 2024-08-03 NOTE — Progress Notes (Unsigned)
    GYNECOLOGY OFFICE PROCEDURE NOTE  [name] [name] is a 33 y.o. [id number] here for IUD insertion. No GYN concerns.  Last pap smear was on 2020 and was normal.  IUD Insertion Procedure Note Procedure: IUD removal and then insertion with Mirena  UPT: Negative GC/CT testing: Offered and accepts  Patient identified.  Risks, benefits and alternatives of procedure were discussed including irregular bleeding, cramping, infection, malpositioning or misplacement of the IUD outside the uterus which may require further procedure such as laparoscopy. Also discussed >99% contraception efficacy, increased risk of ectopic pregnancy with failure of method.   Emphasized that this did not protect against STIs, condoms recommended during all sexual encounters. Consent signed. Time out performed.   Speculum inserted and cervix visualized. Cervix prepped with betadine in usual fashion.   Cervix numbed with 8 cc of 1% lidocaine  at 12/3/6/9 oclock of the cervix. Tenaculum applied. The strings of the IUD were grasped and pulled using ring forceps. The IUD was successfully removed in its entirety.   Uterus sounded to 7 cm.  and IUD then inserted without difficulty per manufacturer's instructions and strings cut to 3 cm below cervical os and all instruments removed. Pt tolerated well with minimal pain and bleeding.   Discussed concerning signs/symptoms and to call if heavy bleeding, severe abdominal pain, or fever in the following 3 weeks. Manufacturer pamphlet/patient information given. Reviewed timing of efficacy for contraception and to use an alternative form of birth control until that time.   Vina Solian, MD, FACOG Obstetrician & Gynecologist, [facility] for [facility], [facility]

## 2024-08-04 ENCOUNTER — Encounter: Payer: Self-pay | Admitting: Obstetrics and Gynecology

## 2024-08-04 ENCOUNTER — Ambulatory Visit: Payer: MEDICAID | Admitting: Obstetrics and Gynecology

## 2024-08-04 ENCOUNTER — Other Ambulatory Visit (HOSPITAL_COMMUNITY)
Admission: RE | Admit: 2024-08-04 | Discharge: 2024-08-04 | Disposition: A | Payer: MEDICAID | Source: Ambulatory Visit | Attending: Obstetrics and Gynecology | Admitting: Obstetrics and Gynecology

## 2024-08-04 VITALS — BP 134/86 | HR 86 | Wt 236.0 lb

## 2024-08-04 DIAGNOSIS — Z30433 Encounter for removal and reinsertion of intrauterine contraceptive device: Secondary | ICD-10-CM

## 2024-08-04 DIAGNOSIS — Z30432 Encounter for removal of intrauterine contraceptive device: Secondary | ICD-10-CM

## 2024-08-04 DIAGNOSIS — Z3202 Encounter for pregnancy test, result negative: Secondary | ICD-10-CM

## 2024-08-04 DIAGNOSIS — Z113 Encounter for screening for infections with a predominantly sexual mode of transmission: Secondary | ICD-10-CM | POA: Diagnosis present

## 2024-08-04 DIAGNOSIS — Z30430 Encounter for insertion of intrauterine contraceptive device: Secondary | ICD-10-CM

## 2024-08-04 LAB — POCT URINE PREGNANCY: Preg Test, Ur: NEGATIVE

## 2024-08-04 MED ORDER — KETOROLAC TROMETHAMINE 60 MG/2ML IM SOLN
60.0000 mg | Freq: Once | INTRAMUSCULAR | Status: AC
  Administered 2024-08-04: 60 mg via INTRAMUSCULAR

## 2024-08-04 MED ORDER — LEVONORGESTREL 20 MCG/DAY IU IUD
1.0000 | INTRAUTERINE_SYSTEM | Freq: Once | INTRAUTERINE | Status: AC
  Administered 2024-08-04: 1 via INTRAUTERINE

## 2024-08-05 LAB — CERVICOVAGINAL ANCILLARY ONLY
Chlamydia: NEGATIVE
Comment: NEGATIVE
Comment: NORMAL
Neisseria Gonorrhea: NEGATIVE

## 2024-10-12 ENCOUNTER — Emergency Department (HOSPITAL_COMMUNITY): Payer: MEDICAID

## 2024-10-12 ENCOUNTER — Other Ambulatory Visit: Payer: Self-pay

## 2024-10-12 ENCOUNTER — Emergency Department (HOSPITAL_COMMUNITY)
Admission: EM | Admit: 2024-10-12 | Discharge: 2024-10-12 | Disposition: A | Discharge status: Home / Self Care | Payer: MEDICAID | Attending: Emergency Medicine | Admitting: Emergency Medicine

## 2024-10-12 ENCOUNTER — Encounter (HOSPITAL_COMMUNITY): Payer: Self-pay

## 2024-10-12 DIAGNOSIS — R112 Nausea with vomiting, unspecified: Secondary | ICD-10-CM | POA: Insufficient documentation

## 2024-10-12 DIAGNOSIS — J45909 Unspecified asthma, uncomplicated: Secondary | ICD-10-CM | POA: Diagnosis not present

## 2024-10-12 DIAGNOSIS — R1084 Generalized abdominal pain: Secondary | ICD-10-CM | POA: Diagnosis not present

## 2024-10-12 DIAGNOSIS — D72829 Elevated white blood cell count, unspecified: Secondary | ICD-10-CM | POA: Insufficient documentation

## 2024-10-12 LAB — CBC WITH DIFFERENTIAL/PLATELET
Abs Immature Granulocytes: 0.12 10*3/uL — ABNORMAL HIGH (ref 0.00–0.07)
Basophils Absolute: 0.1 10*3/uL (ref 0.0–0.1)
Basophils Relative: 0 %
Eosinophils Absolute: 0 10*3/uL (ref 0.0–0.5)
Eosinophils Relative: 0 %
HCT: 44.9 % (ref 36.0–46.0)
Hemoglobin: 15.5 g/dL — ABNORMAL HIGH (ref 12.0–15.0)
Immature Granulocytes: 1 %
Lymphocytes Relative: 6 %
Lymphs Abs: 1.2 10*3/uL (ref 0.7–4.0)
MCH: 31 pg (ref 26.0–34.0)
MCHC: 34.5 g/dL (ref 30.0–36.0)
MCV: 89.8 fL (ref 80.0–100.0)
Monocytes Absolute: 0.4 10*3/uL (ref 0.1–1.0)
Monocytes Relative: 2 %
Neutro Abs: 17.2 10*3/uL — ABNORMAL HIGH (ref 1.7–7.7)
Neutrophils Relative %: 91 %
Platelets: 510 10*3/uL — ABNORMAL HIGH (ref 150–400)
RBC: 5 MIL/uL (ref 3.87–5.11)
RDW: 13.1 % (ref 11.5–15.5)
WBC: 18.9 10*3/uL — ABNORMAL HIGH (ref 4.0–10.5)
nRBC: 0 % (ref 0.0–0.2)

## 2024-10-12 LAB — COMPREHENSIVE METABOLIC PANEL WITH GFR
ALT: 29 U/L (ref 0–44)
AST: 34 U/L (ref 15–41)
Albumin: 5.1 g/dL — ABNORMAL HIGH (ref 3.5–5.0)
Alkaline Phosphatase: 125 U/L (ref 38–126)
Anion gap: 21 — ABNORMAL HIGH (ref 5–15)
BUN: 12 mg/dL (ref 6–20)
CO2: 23 mmol/L (ref 22–32)
Calcium: 10.5 mg/dL — ABNORMAL HIGH (ref 8.9–10.3)
Chloride: 99 mmol/L (ref 98–111)
Creatinine, Ser: 1.1 mg/dL — ABNORMAL HIGH (ref 0.44–1.00)
GFR, Estimated: >60 mL/min
Glucose, Bld: 168 mg/dL — ABNORMAL HIGH (ref 70–99)
Potassium: 3.7 mmol/L (ref 3.5–5.1)
Sodium: 144 mmol/L (ref 135–145)
Total Bilirubin: 0.8 mg/dL (ref 0.0–1.2)
Total Protein: 9.4 g/dL — ABNORMAL HIGH (ref 6.5–8.1)

## 2024-10-12 LAB — HCG, SERUM, QUALITATIVE: Preg, Serum: NEGATIVE

## 2024-10-12 LAB — URINALYSIS, ROUTINE W REFLEX MICROSCOPIC
Bilirubin Urine: NEGATIVE
Glucose, UA: NEGATIVE mg/dL
Hgb urine dipstick: NEGATIVE
Ketones, ur: 5 mg/dL — AB
Nitrite: NEGATIVE
Protein, ur: 30 mg/dL — AB
Specific Gravity, Urine: 1.026 (ref 1.005–1.030)
pH: 6 (ref 5.0–8.0)

## 2024-10-12 LAB — LIPASE, BLOOD: Lipase: 21 U/L (ref 11–51)

## 2024-10-12 MED ORDER — PROCHLORPERAZINE EDISYLATE 10 MG/2ML IJ SOLN
5.0000 mg | Freq: Once | INTRAMUSCULAR | Status: AC
  Administered 2024-10-12: 5 mg via INTRAVENOUS
  Filled 2024-10-12: qty 2

## 2024-10-12 MED ORDER — PROCHLORPERAZINE MALEATE 10 MG PO TABS: 10.0000 mg | ORAL_TABLET | Freq: Two times a day (BID) | ORAL | 0 refills | Status: AC | PRN

## 2024-10-12 MED ORDER — FAMOTIDINE IN NACL 20-0.9 MG/50ML-% IV SOLN
20.0000 mg | Freq: Once | INTRAVENOUS | Status: AC
  Administered 2024-10-12: 20 mg via INTRAVENOUS
  Filled 2024-10-12: qty 50

## 2024-10-12 MED ORDER — IOHEXOL 300 MG/ML  SOLN
100.0000 mL | Freq: Once | INTRAMUSCULAR | Status: AC | PRN
  Administered 2024-10-12: 100 mL via INTRAVENOUS

## 2024-10-12 MED ORDER — LACTATED RINGERS IV BOLUS
1000.0000 mL | Freq: Once | INTRAVENOUS | Status: AC
  Administered 2024-10-12: 1000 mL via INTRAVENOUS

## 2024-10-12 NOTE — ED Provider Notes (Addendum)
 CT scan unremarkable.  There are some may be esophagitis but there is no obvious pancreatitis cholecystitis or any other positive findings on imaging.  No gallstones or acute cholecystitis.  Patient did have mild elevation in her anion gap which I suspect is from acidosis from her multiple episodes of emesis.  Blood sugar is normal.  Some mild ketones in the urine.  Ultimately when I went back to reevaluate her she was requesting to be discharged.  She is not tolerating p.o.  She does not want to stay for any further management.  She is not requesting a dose of her methadone as I offered her 1 as I think she might miss her dose.  Ultimately I think anion gap is likely secondary to dehydration.  Alcohol  test was not checked.  Ultimately she stable for discharge.  She understands return if symptoms worsen.  I did want to recheck lab but patient did not want to stay she understands risks and benefits.  This chart was dictated using voice recognition software.  Despite best efforts to proofread,  errors can occur which can change the documentation meaning.    Ruthe Cornet, DO 10/12/24 0912    Ruthe Cornet, DO 10/12/24 [phone]

## 2024-10-12 NOTE — ED Notes (Signed)
 Pt continually screaming oouuu checked on pt multiple times.  Sandwich, orange juice and breakfast sandwich given.  Pt continues to intermittently scream and is upset with staff when checked on.  Pt is dressed out in purple scrubs and his belongings are sedulted in cabinet above nurses station (1-4)

## 2024-10-12 NOTE — Discharge Instructions (Signed)
 Take Compazine  as needed for nausea and vomiting given your Zofran  allergy.  Return if symptoms worsen.

## 2024-10-12 NOTE — ED Triage Notes (Signed)
 Pt has been vomiting x 10 hrs. Pt is in triage spitting on herself.

## 2024-10-12 NOTE — ED Provider Notes (Signed)
 " [place] EMERGENCY DEPARTMENT AT [facility] Provider Note   CSN: [id number] Arrival date & time: 10/12/24  9482     Patient presents with: Emesis   [name] [name] is a 34 y.o. female.  Past Medical History:  Diagnosis Date   ADHD (attention deficit hyperactivity disorder)    Anxiety    Asthma 2018   Panic attacks    Serotonin syndrome    Vaginal Pap smear, abnormal      Emesis 34 year old female presents to ED for vomiting for the last 10 hours.  Patient states she send abdominal pain for the last 10 hours and has been unable to keep anything down.  Patient is unable to describe exactly where her abdominal pain is.  States she feels like she is dying.  Does not endorse any recent illnesses.  Patient requesting a drink as she feels like her mouth is dry.  Patient on daily methadone. Patient has history of suspected serotonin syndrome due to fluoxetine  and cannot receive Zofran .  Unknown last menstrual period.  Denies history of gallstones, substance use, alcohol  use, history of pancreatitis.     Prior to Admission medications  Medication Sig Start Date End Date Taking? Authorizing Provider  Vitamin D , Ergocalciferol , (DRISDOL ) 1.25 MG (50000 UNIT) CAPS capsule Take 1 capsule (50,000 Units total) by mouth every 7 (seven) days. 06/30/24   Alvia Corean CROME, FNP    Allergies: Ceclor [cefaclor], Metformin  and related, and Ondansetron     Review of Systems  Gastrointestinal:  Positive for vomiting.    Updated Vital Signs BP (!) 152/95   Pulse (!) 114   Temp 98.5 F (36.9 C) (Oral)   Resp (!) 22   SpO2 100%   Physical Exam Vitals and nursing note reviewed.  Constitutional:      General: She is not in acute distress.    Appearance: She is well-developed.  HENT:     Head: Normocephalic and atraumatic.  Eyes:     Conjunctiva/sclera: Conjunctivae normal.  Cardiovascular:     Rate and Rhythm: Normal rate and regular rhythm.     Heart sounds: No murmur  heard. Pulmonary:     Effort: Pulmonary effort is normal. No respiratory distress.     Breath sounds: Normal breath sounds.  Abdominal:     Palpations: Abdomen is soft.     Tenderness: There is abdominal tenderness (Diffuse tenderness to abdomen). There is no guarding or rebound.  Musculoskeletal:        General: No swelling.     Cervical back: Neck supple.  Skin:    General: Skin is warm and dry.     Capillary Refill: Capillary refill takes less than 2 seconds.  Neurological:     Mental Status: She is alert.  Psychiatric:        Mood and Affect: Mood normal.     (all labs ordered are listed, but only abnormal results are displayed) Labs Reviewed  COMPREHENSIVE METABOLIC PANEL WITH GFR  LIPASE, BLOOD  CBC WITH DIFFERENTIAL/PLATELET  URINALYSIS, ROUTINE W REFLEX MICROSCOPIC  HCG, SERUM, QUALITATIVE    EKG: None  Radiology: No results found.   Procedures   Medications Ordered in the ED  lactated ringers  bolus 1,000 mL (has no administration in time range)  famotidine  (PEPCID ) IVPB 20 mg premix (has no administration in time range)  prochlorperazine  (COMPAZINE ) injection 5 mg (has no administration in time range)    Clinical Course as of 10/12/24 0646  Wed Oct 12, 2024  0645 WBC(!): 18.9 Ordered CT Abd Pelvis/RUQ US  given abdominal tenderness and white count [GC]    Clinical Course User Index [GC] Surya Folden, Tillman DASEN, PA-C                                 Medical Decision Making Amount and/or Complexity of Data Reviewed Labs: ordered. Decision-making details documented in ED Course. Radiology: ordered.  Risk Prescription drug management.   This patient presents to the ED for concern of nausea and vomiting, this involves an extensive number of treatment options, and is a complaint that carries with it a high risk of complications and morbidity.  The differential diagnosis includes acute cholecystitis, pancreatitis, colitis, small bowel obstruction,  appendicitis   Co morbidities that complicate the patient evaluation  Patient is obese   Additional history obtained:  Additional history obtained from chart review   7:17 AM Care of [name] [name] transferred to Dr. Ruthe at the end of my shift as the patient will require reassessment once labs/imaging have resulted. Patient presentation, ED course, and plan of care discussed with review of all pertinent labs and imaging. Please see his/her note for further details regarding further ED course and disposition. Plan at time of handoff is CT abd/pelvis and RUQ US , labs currently pending. This may be altered or completely changed at the discretion of the oncoming team pending results of further workup.  Final diagnoses:  None    ED Discharge Orders     None          Harold Tillman DASEN DEVONNA 10/12/24 9281    Trine Raynell Moder, MD 10/12/24 [phone]  "

## 2024-10-12 NOTE — ED Notes (Signed)
 Pt advises that she is feeling much better since she had the IV fluids
